# Patient Record
Sex: Female | Born: 1990 | Race: Black or African American | Hispanic: No | Marital: Single | Smoking: Never smoker
Health system: Southern US, Community
[De-identification: ages and names within clinical notes are randomized; demographics above are authoritative.]

## PROBLEM LIST (undated history)

## (undated) DIAGNOSIS — D649 Anemia, unspecified: Secondary | ICD-10-CM

## (undated) DIAGNOSIS — D696 Thrombocytopenia, unspecified: Secondary | ICD-10-CM

## (undated) DIAGNOSIS — O99119 Other diseases of the blood and blood-forming organs and certain disorders involving the immune mechanism complicating pregnancy, unspecified trimester: Secondary | ICD-10-CM

## (undated) HISTORY — DX: Other diseases of the blood and blood-forming organs and certain disorders involving the immune mechanism complicating pregnancy, unspecified trimester: O99.119

## (undated) HISTORY — DX: Thrombocytopenia, unspecified: D69.6

## (undated) HISTORY — PX: DILATION AND CURETTAGE OF UTERUS: SHX78

---
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
## 2009-01-16 ENCOUNTER — Emergency Department (HOSPITAL_COMMUNITY): Admission: EM | Admit: 2009-01-16 | Discharge: 2009-01-17 | Payer: Self-pay | Admitting: Emergency Medicine

## 2010-01-22 ENCOUNTER — Emergency Department (HOSPITAL_COMMUNITY): Admission: EM | Admit: 2010-01-22 | Discharge: 2010-01-22 | Payer: Self-pay | Admitting: Emergency Medicine

## 2010-07-20 LAB — GC/CHLAMYDIA PROBE AMP, GENITAL
Chlamydia, DNA Probe: NEGATIVE
GC Probe Amp, Genital: NEGATIVE

## 2010-07-20 LAB — URINE MICROSCOPIC-ADD ON

## 2010-07-20 LAB — URINALYSIS, ROUTINE W REFLEX MICROSCOPIC
Glucose, UA: NEGATIVE mg/dL
Ketones, ur: NEGATIVE mg/dL
pH: 7.5 (ref 5.0–8.0)

## 2015-03-14 DIAGNOSIS — Z3490 Encounter for supervision of normal pregnancy, unspecified, unspecified trimester: Secondary | ICD-10-CM | POA: Insufficient documentation

## 2017-04-13 DIAGNOSIS — K4090 Unilateral inguinal hernia, without obstruction or gangrene, not specified as recurrent: Secondary | ICD-10-CM

## 2017-04-13 HISTORY — DX: Unilateral inguinal hernia, without obstruction or gangrene, not specified as recurrent: K40.90

## 2017-05-19 DIAGNOSIS — Z0000 Encounter for general adult medical examination without abnormal findings: Secondary | ICD-10-CM | POA: Insufficient documentation

## 2017-05-19 DIAGNOSIS — D509 Iron deficiency anemia, unspecified: Secondary | ICD-10-CM | POA: Insufficient documentation

## 2017-05-19 DIAGNOSIS — L309 Dermatitis, unspecified: Secondary | ICD-10-CM | POA: Insufficient documentation

## 2017-05-19 DIAGNOSIS — Z01419 Encounter for gynecological examination (general) (routine) without abnormal findings: Secondary | ICD-10-CM | POA: Insufficient documentation

## 2017-06-16 DIAGNOSIS — K4090 Unilateral inguinal hernia, without obstruction or gangrene, not specified as recurrent: Secondary | ICD-10-CM | POA: Insufficient documentation

## 2017-09-17 DIAGNOSIS — E559 Vitamin D deficiency, unspecified: Secondary | ICD-10-CM | POA: Insufficient documentation

## 2018-08-29 HISTORY — PX: BREAST BIOPSY: SHX20

## 2019-03-07 ENCOUNTER — Ambulatory Visit (INDEPENDENT_AMBULATORY_CARE_PROVIDER_SITE_OTHER): Payer: 59 | Admitting: *Deleted

## 2019-03-07 ENCOUNTER — Other Ambulatory Visit: Payer: Self-pay

## 2019-03-07 ENCOUNTER — Encounter: Payer: Self-pay | Admitting: Family Medicine

## 2019-03-07 VITALS — Ht 61.0 in | Wt 117.2 lb

## 2019-03-07 DIAGNOSIS — Z3200 Encounter for pregnancy test, result unknown: Secondary | ICD-10-CM

## 2019-03-07 DIAGNOSIS — Z3201 Encounter for pregnancy test, result positive: Secondary | ICD-10-CM

## 2019-03-07 LAB — POCT PREGNANCY, URINE: Preg Test, Ur: POSITIVE — AB

## 2019-03-07 NOTE — Progress Notes (Addendum)
Pt moved to [place] from [place] one month ago. She was informed of +UPT.  LMP 01/29/19.  EDD 11/05/19. Pt reports she had C/S birth with her first child 03/15/15 in [place] due to her cervix was swollen. Pt desires pregnancy care in this office.

## 2019-03-21 ENCOUNTER — Telehealth (INDEPENDENT_AMBULATORY_CARE_PROVIDER_SITE_OTHER): Payer: 59 | Admitting: Lactation Services

## 2019-03-21 DIAGNOSIS — Z3480 Encounter for supervision of other normal pregnancy, unspecified trimester: Secondary | ICD-10-CM

## 2019-03-21 NOTE — Telephone Encounter (Signed)
Returned pts call in regards to spotting pink/brown discharge. She is noting is occasionally. She noted it when she wiped yesterday but sometimes noted in underwear. Reviewed this is normal in early pregnancy. Reviewed if bleeding like a period or having severe abdominal pain that pt needs to go to the MAU for assessment. Pt was informed of where MAU is located in [place].    Pt is experiencing nausea with eating and drinking. Enc small frequent meals and sipping water. Discussed Vitamin B6 is helpful for some. Enc pt to eat crackers before getting out of bed. Pt is not vomiting.   Pt to call with further questions or concerns as needed.

## 2019-03-22 ENCOUNTER — Telehealth (INDEPENDENT_AMBULATORY_CARE_PROVIDER_SITE_OTHER): Payer: 59 | Admitting: Family Medicine

## 2019-03-22 ENCOUNTER — Other Ambulatory Visit: Payer: Self-pay

## 2019-03-22 ENCOUNTER — Inpatient Hospital Stay (HOSPITAL_COMMUNITY)
Admission: AD | Admit: 2019-03-22 | Discharge: 2019-03-22 | Disposition: A | Discharge status: Home / Self Care | Payer: 59 | Attending: Obstetrics and Gynecology | Admitting: Obstetrics and Gynecology

## 2019-03-22 ENCOUNTER — Inpatient Hospital Stay (HOSPITAL_COMMUNITY): Payer: 59

## 2019-03-22 ENCOUNTER — Encounter (HOSPITAL_COMMUNITY): Payer: Self-pay | Admitting: Student

## 2019-03-22 DIAGNOSIS — O23591 Infection of other part of genital tract in pregnancy, first trimester: Secondary | ICD-10-CM | POA: Diagnosis not present

## 2019-03-22 DIAGNOSIS — Z3491 Encounter for supervision of normal pregnancy, unspecified, first trimester: Secondary | ICD-10-CM

## 2019-03-22 DIAGNOSIS — Z3A01 Less than 8 weeks gestation of pregnancy: Secondary | ICD-10-CM | POA: Insufficient documentation

## 2019-03-22 DIAGNOSIS — B9689 Other specified bacterial agents as the cause of diseases classified elsewhere: Secondary | ICD-10-CM | POA: Insufficient documentation

## 2019-03-22 DIAGNOSIS — O209 Hemorrhage in early pregnancy, unspecified: Secondary | ICD-10-CM

## 2019-03-22 DIAGNOSIS — M419 Scoliosis, unspecified: Secondary | ICD-10-CM | POA: Diagnosis not present

## 2019-03-22 DIAGNOSIS — O468X1 Other antepartum hemorrhage, first trimester: Secondary | ICD-10-CM | POA: Diagnosis not present

## 2019-03-22 DIAGNOSIS — O418X10 Other specified disorders of amniotic fluid and membranes, first trimester, not applicable or unspecified: Secondary | ICD-10-CM | POA: Insufficient documentation

## 2019-03-22 HISTORY — DX: Anemia, unspecified: D64.9

## 2019-03-22 LAB — URINALYSIS, ROUTINE W REFLEX MICROSCOPIC
Bilirubin Urine: NEGATIVE
Glucose, UA: NEGATIVE mg/dL
Hgb urine dipstick: NEGATIVE
Ketones, ur: NEGATIVE mg/dL
Leukocytes,Ua: NEGATIVE
Nitrite: NEGATIVE
Protein, ur: NEGATIVE mg/dL
Specific Gravity, Urine: 1.019 (ref 1.005–1.030)
pH: 5 (ref 5.0–8.0)

## 2019-03-22 LAB — WET PREP, GENITAL
Sperm: NONE SEEN
Trich, Wet Prep: NONE SEEN
Yeast Wet Prep HPF POC: NONE SEEN

## 2019-03-22 LAB — CBC
HCT: 39.7 % (ref 36.0–46.0)
Hemoglobin: 12.7 g/dL (ref 12.0–15.0)
MCH: 27.1 pg (ref 26.0–34.0)
MCHC: 32 g/dL (ref 30.0–36.0)
MCV: 84.8 fL (ref 80.0–100.0)
Platelets: 129 10*3/uL — ABNORMAL LOW (ref 150–400)
RBC: 4.68 MIL/uL (ref 3.87–5.11)
RDW: 19.2 % — ABNORMAL HIGH (ref 11.5–15.5)
WBC: 8.4 10*3/uL (ref 4.0–10.5)
nRBC: 0 % (ref 0.0–0.2)

## 2019-03-22 LAB — HCG, QUANTITATIVE, PREGNANCY: hCG, Beta Chain, Quant, S: 125319 m[IU]/mL — ABNORMAL HIGH (ref <5)

## 2019-03-22 LAB — ABO/RH: ABO/RH(D): O POS

## 2019-03-22 MED ORDER — METRONIDAZOLE 500 MG PO TABS: 500.0000 mg | ORAL_TABLET | Freq: Two times a day (BID) | ORAL | 0 refills | Status: DC

## 2019-03-22 NOTE — MAU Note (Signed)
Michelle Velez is a 28 y.o. at [redacted]w[redacted]d here in MAU reporting: for the past 2 days after having a BM she has seen blood. She states the bleeding is not coming from her rectum it is vaginal. States prior coming to MAU she used the bathroom to pee only and saw bleeding then. States bleeding drips into toilet and sees it on the toilet paper but not in her underwear. Also having a yellow discharge, states there is a slight odor but no itching. States she has pain at night in legs and hips.  Onset of complaint: 2 days  Pain score: 0/10  Vitals:   03/22/19 1816  BP: 113/67  Pulse: 78  Resp: 16  Temp: 98.2 F (36.8 C)  SpO2: 100%     Lab orders placed from triage: UA

## 2019-03-22 NOTE — MAU Provider Note (Signed)
Chief Complaint: Vaginal Bleeding and Vaginal Discharge   First Provider Initiated Contact with Patient 03/22/19 1836     SUBJECTIVE HPI: Michelle Velez is a 28 y.o. G3P1010 at [redacted]w[redacted]d who presents to Maternity Admissions reporting vaginal bleeding. Symptoms started a few days ago. States whenever she uses the bathroom pink tinged blood drips in the toilet. States the bleeding is definitely coming from her vagina. Reports intermittent pains in her right pelvic area over her hernia.  Denies fever/chills, dysuria, rectal bleeding. Has not had any evaluation with this pregnancy so far.   Location: abdomen/pelvis Quality: sharp Severity: 0 currently/10 on pain scale Duration: 3 days Timing: intermittently  Modifying factors: none Associated signs and symptoms: vaginal bleeding  Past Medical History:  Diagnosis Date  . Anemia   . Inguinal hernia 2019  . Scoliosis 2006   OB History  Gravida Para Term Preterm AB Living  3 1 1   1     SAB TAB Ectopic Multiple Live Births  1            # Outcome Date GA Lbr Len/2nd Weight Sex Delivery Anes PTL Lv  3 Current           2 Term 03/15/15     CS-LTranv     1 SAB 2013           Past Surgical History:  Procedure Laterality Date  . CESAREAN SECTION     Social History   Socioeconomic History  . Marital status: Single    Spouse name: Not on file  . Number of children: Not on file  . Years of education: Not on file  . Highest education level: Not on file  Occupational History  . Not on file  Social Needs  . Financial resource strain: Not on file  . Food insecurity    Worry: Not on file    Inability: Not on file  . Transportation needs    Medical: Not on file    Non-medical: Not on file  Tobacco Use  . Smoking status: Never Smoker  . Smokeless tobacco: Never Used  Substance and Sexual Activity  . Alcohol use: Never    Frequency: Never  . Drug use: Never  . Sexual activity: Yes  Lifestyle  . Physical activity    Days per week: Not  on file    Minutes per session: Not on file  . Stress: Not on file  Relationships  . Social 2014 on phone: Not on file    Gets together: Not on file    Attends religious service: Not on file    Active member of club or organization: Not on file    Attends meetings of clubs or organizations: Not on file    Relationship status: Not on file  . Intimate partner violence    Fear of current or ex partner: Not on file    Emotionally abused: Not on file    Physically abused: Not on file    Forced sexual activity: Not on file  Other Topics Concern  . Not on file  Social History Narrative  . Not on file   History reviewed. No pertinent family history. No current facility-administered medications on file prior to encounter.    Current Outpatient Medications on File Prior to Encounter  Medication Sig Dispense Refill  . ferrous sulfate 325 (65 FE) MG tablet Take 325 mg by mouth every other day.    . Prenatal Vit-Fe Fumarate-FA (MULTIVITAMIN-PRENATAL) 27-0.8 MG  TABS tablet Take 1 tablet by mouth daily at 12 noon.     No Known Allergies  I have reviewed patient's Past Medical Hx, Surgical Hx, Family Hx, Social Hx, medications and allergies.   Review of Systems  Constitutional: Negative.   Gastrointestinal: Positive for abdominal pain (none currently) and constipation. Negative for anal bleeding, blood in stool, diarrhea, nausea, rectal pain and vomiting.  Genitourinary: Positive for vaginal bleeding and vaginal discharge. Negative for dysuria.    OBJECTIVE Patient Vitals for the past 24 hrs:  BP Temp Temp src Pulse Resp SpO2 Height Weight  03/22/19 1816 113/67 98.2 F (36.8 C) Oral 78 16 100 % - -  03/22/19 1813 - - - - - - 5\' 1"  (1.549 m) 52.9 kg   Constitutional: Well-developed, well-nourished female in no acute distress.  Cardiovascular: normal rate & rhythm, no murmur Respiratory: normal rate and effort. Lung sounds clear throughout GI: Abd soft, non-tender, Pos  BS x 4. No guarding or rebound tenderness MS: Extremities nontender, no edema, normal ROM Neurologic: Alert and oriented x 4.  GU:     SPECULUM EXAM: NEFG, no blood. Cervix pink/smooth. Moderate amount of foul smelling tan discharge.   BIMANUAL: No CMT. cervix closed; uterus normal size, no adnexal tenderness or masses.    LAB RESULTS Results for orders placed or performed during the hospital encounter of 03/22/19 (from the past 24 hour(s))  Urinalysis, Routine w reflex microscopic     Status: None   Collection Time: 03/22/19  6:24 PM  Result Value Ref Range   Color, Urine YELLOW YELLOW   APPearance CLEAR CLEAR   Specific Gravity, Urine 1.019 1.005 - 1.030   pH 5.0 5.0 - 8.0   Glucose, UA NEGATIVE NEGATIVE mg/dL   Hgb urine dipstick NEGATIVE NEGATIVE   Bilirubin Urine NEGATIVE NEGATIVE   Ketones, ur NEGATIVE NEGATIVE mg/dL   Protein, ur NEGATIVE NEGATIVE mg/dL   Nitrite NEGATIVE NEGATIVE   Leukocytes,Ua NEGATIVE NEGATIVE  CBC     Status: Abnormal   Collection Time: 03/22/19  6:47 PM  Result Value Ref Range   WBC 8.4 4.0 - 10.5 K/uL   RBC 4.68 3.87 - 5.11 MIL/uL   Hemoglobin 12.7 12.0 - 15.0 g/dL   HCT 40.939.7 81.136.0 - 91.446.0 %   MCV 84.8 80.0 - 100.0 fL   MCH 27.1 26.0 - 34.0 pg   MCHC 32.0 30.0 - 36.0 g/dL   RDW 78.219.2 (H) 95.611.5 - 21.315.5 %   Platelets 129 (L) 150 - 400 K/uL   nRBC 0.0 0.0 - 0.2 %  ABO/Rh     Status: None   Collection Time: 03/22/19  6:47 PM  Result Value Ref Range   ABO/RH(D) O POS    No rh immune globuloin      NOT A RH IMMUNE GLOBULIN CANDIDATE, PT RH POSITIVE Performed at [facility], [address] [place], [place] [address]   Wet prep, genital     Status: Abnormal   Collection Time: 03/22/19  7:13 PM  Result Value Ref Range   Yeast Wet Prep HPF POC NONE SEEN NONE SEEN   Trich, Wet Prep NONE SEEN NONE SEEN   Clue Cells Wet Prep HPF POC PRESENT (A) NONE SEEN   WBC, Wet Prep HPF POC MANY (A) NONE SEEN   Sperm NONE SEEN     IMAGING No results  found.  MAU COURSE Orders Placed This Encounter  Procedures  . Wet prep, genital  . US OB Comp  Less 14 Wks  . Urinalysis, Routine w reflex microscopic  . CBC  . hCG, quantitative, pregnancy  . ABO/Rh  . Discharge patient   Meds ordered this encounter  Medications  . metroNIDAZOLE (FLAGYL) 500 MG tablet    Sig: Take 1 tablet (500 mg total) by mouth 2 (two) times daily.    Dispense:  14 tablet    Refill:  0    Order Specific Question:   Supervising Provider    Answer:   ERVIN, MICHAEL L [[id number]]    MDM +UPT UA, wet prep, GC/chlamydia, CBC, ABO/Rh, quant hCG, and [place] today to rule out ectopic pregnancy which can be life threatening.   RH positive  No blood on exam. Discharge consistent with bacterial vaginosis & wet prep positive for clue cells  Ultrasound shows live IUP & a small [place]   A:  1. Normal IUP (intrauterine pregnancy) on prenatal ultrasound, first trimester   2. Vaginal bleeding in pregnancy, first trimester   3. Subchorionic hematoma in first trimester, single or unspecified fetus   4. Bacterial vaginosis    P: Discharge home Rx flagyl GC/CT pending F/u with ob/gyn Reviewed reasons to return to MAU   Jorje Guild, NP 03/22/2019  8:01 PM

## 2019-03-22 NOTE — Discharge Instructions (Signed)
Subchorionic Hematoma ° °A subchorionic hematoma is a gathering of blood between the outer wall of the embryo (chorion) and the inner wall of the womb (uterus). °This condition can cause vaginal bleeding. If they cause little or no vaginal bleeding, early small hematomas usually shrink on their own and do not affect your baby or pregnancy. When bleeding starts later in pregnancy, or if the hematoma is larger or occurs in older pregnant women, the condition may be more serious. Larger hematomas may get bigger, which increases the chances of miscarriage. This condition also increases the risk of: °· Premature separation of the placenta from the uterus. °· Premature (preterm) labor. °· Stillbirth. °What are the causes? °The exact cause of this condition is not known. It occurs when blood is trapped between the placenta and the uterine wall because the placenta has separated from the original site of implantation. °What increases the risk? °You are more likely to develop this condition if: °· You were treated with fertility medicines. °· You conceived through in vitro fertilization (IVF). °What are the signs or symptoms? °Symptoms of this condition include: °· Vaginal spotting or bleeding. °· Contractions of the uterus. These cause abdominal pain. °Sometimes you may have no symptoms and the bleeding may only be seen when ultrasound images are taken (transvaginal ultrasound). °How is this diagnosed? °This condition is diagnosed based on a physical exam. This includes a pelvic exam. You may also have other tests, including: °· Blood tests. °· Urine tests. °· Ultrasound of the abdomen. °How is this treated? °Treatment for this condition can vary. Treatment may include: °· Watchful waiting. You will be monitored closely for any changes in bleeding. During this stage: °? The hematoma may be reabsorbed by the body. °? The hematoma may separate the fluid-filled space containing the embryo (gestational sac) from the wall of the  womb (endometrium). °· Medicines. °· Activity restriction. This may be needed until the bleeding stops. °Follow these instructions at home: °· Stay on bed rest if told to do so by your health care provider. °· Do not lift anything that is heavier than 10 lbs. (4.5 kg) or as told by your health care provider. °· Do not use any products that contain nicotine or tobacco, such as cigarettes and e-cigarettes. If you need help quitting, ask your health care provider. °· Track and write down the number of pads you use each day and how soaked (saturated) they are. °· Do not use tampons. °· Keep all follow-up visits as told by your health care provider. This is important. Your health care provider may ask you to have follow-up blood tests or ultrasound tests or both. °Contact a health care provider if: °· You have any vaginal bleeding. °· You have a fever. °Get help right away if: °· You have severe cramps in your stomach, back, abdomen, or pelvis. °· You pass large clots or tissue. Save any tissue for your health care provider to look at. °· You have more vaginal bleeding, and you faint or become lightheaded or weak. °Summary °· A subchorionic hematoma is a gathering of blood between the outer wall of the placenta and the uterus. °· This condition can cause vaginal bleeding. °· Sometimes you may have no symptoms and the bleeding may only be seen when ultrasound images are taken. °· Treatment may include watchful waiting, medicines, or activity restriction. °This information is not intended to replace advice given to you by your health care provider. Make sure you discuss any questions you   have with your health care provider. Document Released: 07/15/2006 Document Revised: 03/12/2017 Document Reviewed: 05/26/2016 Elsevier Patient Education  2020 [employer].     Constipation, Adult Constipation is when a person has fewer bowel movements in a week than normal, has difficulty having a bowel movement, or has stools  that are dry, hard, or larger than normal. Constipation may be caused by an underlying condition. It may become worse with age if a person takes certain medicines and does not take in enough fluids. Follow these instructions at home: Eating and drinking   Eat foods that have a lot of fiber, such as fresh fruits and vegetables, whole grains, and beans.  Limit foods that are high in fat, low in fiber, or overly processed, such as french fries, hamburgers, cookies, candies, and soda.  Drink enough fluid to keep your urine clear or pale yellow. General instructions  Exercise regularly or as told by your health care provider.  Go to the restroom when you have the urge to go. Do not hold it in.  Take over-the-counter and prescription medicines only as told by your health care provider. These include any fiber supplements.  Practice pelvic floor retraining exercises, such as deep breathing while relaxing the lower abdomen and pelvic floor relaxation during bowel movements.  Watch your condition for any changes.  Keep all follow-up visits as told by your health care provider. This is important. Contact a health care provider if:  You have pain that gets worse.  You have a fever.  You do not have a bowel movement after 4 days.  You vomit.  You are not hungry.  You lose weight.  You are bleeding from the anus.  You have thin, pencil-like stools. Get help right away if:  You have a fever and your symptoms suddenly get worse.  You leak stool or have blood in your stool.  Your abdomen is bloated.  You have severe pain in your abdomen.  You feel dizzy or you faint. This information is not intended to replace advice given to you by your health care provider. Make sure you discuss any questions you have with your health care provider. Document Released: 12/27/2003 Document Revised: 03/12/2017 Document Reviewed: 09/18/2015 Elsevier Patient Education  [employer].       Bacterial Vaginosis  Bacterial vaginosis is an infection of the vagina. It happens when too many normal germs (healthy bacteria) grow in the vagina. This infection puts you at risk for infections from sex (STIs). Treating this infection can lower your risk for some STIs. You should also treat this if you are pregnant. It can cause your baby to be born early. Follow these instructions at home: Medicines  Take over-the-counter and prescription medicines only as told by your doctor.  Take or use your antibiotic medicine as told by your doctor. Do not stop taking or using it even if you start to feel better. General instructions  If you your sexual partner is a woman, tell her that you have this infection. She needs to get treatment if she has symptoms. If you have a female partner, he does not need to be treated.  During treatment: ? Avoid sex. ? Do not douche. ? Avoid alcohol as told. ? Avoid breastfeeding as told.  Drink enough fluid to keep your pee (urine) clear or pale yellow.  Keep your vagina and butt (rectum) clean. ? Wash the area with warm water every day. ? Wipe from front to back after you  use the toilet.  Keep all follow-up visits as told by your doctor. This is important. Preventing this condition  Do not douche.  Use only warm water to wash around your vagina.  Use protection when you have sex. This includes: ? Latex condoms. ? Dental dams.  Limit how many people you have sex with. It is best to only have sex with the same person (be monogamous).  Get tested for STIs. Have your partner get tested.  Wear underwear that is cotton or lined with cotton.  Avoid tight pants and pantyhose. This is most important in summer.  Do not use any products that have nicotine or tobacco in them. These include cigarettes and e-cigarettes. If you need help quitting, ask your doctor.  Do not use illegal drugs.  Limit how much alcohol you drink. Contact a  doctor if:  Your symptoms do not get better, even after you are treated.  You have more discharge or pain when you pee (urinate).  You have a fever.  You have pain in your belly (abdomen).  You have pain with sex.  Your bleed from your vagina between periods. Summary  This infection happens when too many germs (bacteria) grow in the vagina.  Treating this condition can lower your risk for some infections from sex (STIs).  You should also treat this if you are pregnant. It can cause early (premature) birth.  Do not stop taking or using your antibiotic medicine even if you start to feel better. This information is not intended to replace advice given to you by your health care provider. Make sure you discuss any questions you have with your health care provider. Document Released: 01/07/2008 Document Revised: 03/12/2017 Document Reviewed: 12/14/2015 Elsevier Patient Education  2020 [employer].

## 2019-03-22 NOTE — Telephone Encounter (Signed)
Patient called to say she was having some virginal bleeding with bowel movements. Her first appointment is with [place] on 12/17 via telephone for intake visit.

## 2019-03-22 NOTE — Telephone Encounter (Signed)
Pt reports she is having vaginal bleeding when having bowel movements. She reports it is dropping in the toilet and is about the size of a quarter x 1 this morning and reports she is sure it is coming from her vagina. Discussed that this can be normal early on due to increasing vascularity of the cervix. Reviewed when to seek further care.   She is constipated and straining to stool. She reports she is not drinking lots of water. She has to drink slowly due to nausea. She is sipping water off and on all day. She is eating fruits daily. She does have a hemorrhoid and reports it is not bleeding. Reviewed increasing fiber and getting some stool softener to take 2 x a day to help with constipation.   Pt reports some cramping yesterday evening. She reports she has a hernia under her pelvic bone. Reviewed some mild cramping is normal in early pregnancy.   She reports she is having vaginal discharge that is white or yellow with an odor. She has had BV previously. She is not sure if this is BV like she had before. Offered her an appt. from Monday at 2:30 for self swab ans she agreed.   Reviewed with pt that is she is having increased bleeding or pain she can go to the Maternity Assessment Unit at [facility]. She was given address. Discussed ideally she only needs to go in case of emergency as indicated above as Covid cases are rising. Pt voiced understanding.

## 2019-03-23 ENCOUNTER — Encounter: Payer: Self-pay | Admitting: *Deleted

## 2019-03-24 ENCOUNTER — Telehealth: Payer: Self-pay | Admitting: Obstetrics & Gynecology

## 2019-03-24 LAB — GC/CHLAMYDIA PROBE AMP (~~LOC~~) NOT AT ARMC
Chlamydia: NEGATIVE
Comment: NEGATIVE
Comment: NORMAL
Neisseria Gonorrhea: NEGATIVE

## 2019-03-24 NOTE — Telephone Encounter (Signed)
Pt called nurse line c/o inability to swallow flagyl.  Pt was diagnosed with BV seeral days ago in MAU as well as subchorionic hemorrhage.  Pt was not c/o vaginal discharge.  Metrogel has riskier profile than flagyl.  If pt not having vaginal symptoms, I suggest she stop medications and we can retest at her first OB visit.  If she becomes more symptomatic, she can call back and we can discuss starting metrogel.

## 2019-03-27 ENCOUNTER — Ambulatory Visit: Payer: 59

## 2019-03-27 ENCOUNTER — Other Ambulatory Visit: Payer: Self-pay

## 2019-03-27 DIAGNOSIS — N898 Other specified noninflammatory disorders of vagina: Secondary | ICD-10-CM

## 2019-03-27 NOTE — Progress Notes (Signed)
Pt here today for self swab.  Per chart review, pt was advised by Dr. Gala Romney "If pt not having vaginal symptoms, I suggest she stop medications and we can retest at her first OB visit.  If she becomes more symptomatic, she can call back and we can discuss starting metrogel."  Pt reports that she has 8 more tablets left and that she had a lot of discharge that dropped in the toilet yesterday.    I advised pt to complete her tx and that we can reevaluate at her NEW OB visit on 04/12/19.  Pt verbalized understanding.   Mel Almond, RN 03/27/19

## 2019-03-30 ENCOUNTER — Encounter: Payer: Self-pay | Admitting: *Deleted

## 2019-04-13 ENCOUNTER — Ambulatory Visit (INDEPENDENT_AMBULATORY_CARE_PROVIDER_SITE_OTHER): Payer: 59 | Admitting: Obstetrics and Gynecology

## 2019-04-13 ENCOUNTER — Encounter: Payer: Self-pay | Admitting: Obstetrics and Gynecology

## 2019-04-13 ENCOUNTER — Other Ambulatory Visit: Payer: Self-pay

## 2019-04-13 VITALS — BP 113/72 | HR 79 | Wt 113.8 lb

## 2019-04-13 DIAGNOSIS — Z113 Encounter for screening for infections with a predominantly sexual mode of transmission: Secondary | ICD-10-CM

## 2019-04-13 DIAGNOSIS — Z3480 Encounter for supervision of other normal pregnancy, unspecified trimester: Secondary | ICD-10-CM | POA: Insufficient documentation

## 2019-04-13 DIAGNOSIS — N898 Other specified noninflammatory disorders of vagina: Secondary | ICD-10-CM

## 2019-04-13 DIAGNOSIS — O26891 Other specified pregnancy related conditions, first trimester: Secondary | ICD-10-CM

## 2019-04-13 DIAGNOSIS — Z3A10 10 weeks gestation of pregnancy: Secondary | ICD-10-CM

## 2019-04-13 DIAGNOSIS — Z98891 History of uterine scar from previous surgery: Secondary | ICD-10-CM

## 2019-04-13 DIAGNOSIS — O0990 Supervision of high risk pregnancy, unspecified, unspecified trimester: Secondary | ICD-10-CM | POA: Insufficient documentation

## 2019-04-13 HISTORY — DX: History of uterine scar from previous surgery: Z98.891

## 2019-04-13 NOTE — Progress Notes (Signed)
`  Pt states Tues & Wednesday she noticed after bowel movent that she had [place] d/c, she states also has a bump near Vulva area.

## 2019-04-13 NOTE — Progress Notes (Signed)
History:   Michelle Velez is a 28 y.o. G3P1011 at [redacted]w[redacted]d by LMP being seen today for her first obstetrical visit.  Her obstetrical history is significant for Cesarean section. Patient does intend to breast feed. Pregnancy history fully reviewed. States she was told she had a subchorionic hemorrhage with this pregnancy.  Patient reports nausea. Declines the need for medication at this time.     HISTORY: OB History  Gravida Para Term Preterm AB Living  3 1 1  0 1 1  SAB TAB Ectopic Multiple Live Births  1 0 0 0 1    # Outcome Date GA Lbr Len/2nd Weight Sex Delivery Anes PTL Lv  3 Current           2 Term 03/15/15     CS-LTranv     1 SAB 2013            Last pap smear was done 05/19/2017 and was normal  Past Medical History:  Diagnosis Date  . Anemia   . Inguinal hernia 2019  . Scoliosis 2006   Past Surgical History:  Procedure Laterality Date  . CESAREAN SECTION     History reviewed. No pertinent family history. Social History   Tobacco Use  . Smoking status: Never Smoker  . Smokeless tobacco: Never Used  Substance Use Topics  . Alcohol use: Never  . Drug use: Never   No Known Allergies Current Outpatient Medications on File Prior to Visit  Medication Sig Dispense Refill  . Prenatal Vit-Fe Fumarate-FA (MULTIVITAMIN-PRENATAL) 27-0.8 MG TABS tablet Take 1 tablet by mouth daily at 12 noon.    . ferrous sulfate 325 (65 FE) MG tablet Take 325 mg by mouth every other day.     No current facility-administered medications on file prior to visit.    Review of Systems Pertinent items noted in HPI and remainder of comprehensive ROS otherwise negative. Physical Exam:   Vitals:   04/13/19 0900  BP: 113/72  Pulse: 79  Weight: 113 lb 12.8 oz (51.6 kg)   Fetal Heart Rate (bpm): 169 Uterus:     Pelvic Exam: Perineum: no hemorrhoids, normal perineum. Tenderness near bartholin Gland. No abscess or fluctuance.    Vulva: normal external genitalia, no lesions   Vagina:  normal  mucosa, moderate amount of brown vaginal discharge.    Cervix: no lesions and normal, pap smear done.    Adnexa: normal adnexa and no mass, fullness, tenderness   Bony Pelvis: average  System: General: well-developed, well-nourished female in no acute distress   Skin: normal coloration and turgor, no rashes   Neurologic: oriented, normal, negative, normal mood   Extremities: normal strength, tone, and muscle mass, ROM of all joints is normal   HEENT PERRLA, extraocular movement intact and sclera clear, anicteric   Mouth/Teeth mucous membranes moist, pharynx normal without lesions and dental hygiene good   Neck supple and no masses   Cardiovascular: regular rate and rhythm   Respiratory:  no respiratory distress, normal breath sounds   Abdomen: soft, non-tender; bowel sounds normal; no masses,  no organomegaly  Bedside Ultrasound for FHR check: Patient informed that the ultrasound is considered a limited obstetric ultrasound and is not intended to be a complete ultrasound exam.  Patient also informed that the ultrasound is not being completed with the intent of assessing for fetal or placental anomalies or any pelvic abnormalities.  Explained that the purpose of today's ultrasound is to assess for fetal heart rate.  Patient acknowledges the purpose  of the exam and the limitations of the study.     Assessment:    Pregnancy: G3P1011 Patient Active Problem List   Diagnosis Date Noted  . Supervision of other normal pregnancy, antepartum 04/13/2019  . History of cesarean delivery 04/13/2019     Plan:   1. Supervision of other normal pregnancy, antepartum  - [facility] AMB BABYSCRIPTS SCHEDULE OPTIMIZATION - Culture, OB Urine - Genetic Screening - Obstetric Panel, Including HIV - [place] MFM OB COMP + 14 WK; Future - Cervicovaginal ancillary only( [place]) - Bedside US done: active fetus   2. History of cesarean delivery  Should see MD to discuss TOLAC   3. Vaginal discharge  -  Cervicovaginal ancillary only( [place])   Initial labs drawn. Continue prenatal vitamins. Genetic Screening discussed, NIPS: requested. Ultrasound discussed; fetal anatomic survey: requested. Problem list reviewed and updated. The nature of [facility] with multiple MDs and other Advanced Practice Providers was explained to patient; also emphasized that residents, students are part of our team. Routine obstetric precautions reviewed. No follow-ups on file.     Amery Vandenbos, Harolyn Rutherford, NP  [facility] [employer], [facility]

## 2019-04-14 LAB — OBSTETRIC PANEL, INCLUDING HIV
Antibody Screen: NEGATIVE
Basophils Absolute: 0 10*3/uL (ref 0.0–0.2)
Basos: 0 %
EOS (ABSOLUTE): 0.1 10*3/uL (ref 0.0–0.4)
Eos: 1 %
HIV Screen 4th Generation wRfx: NONREACTIVE
Hematocrit: 39.5 % (ref 34.0–46.6)
Hemoglobin: 13.1 g/dL (ref 11.1–15.9)
Hepatitis B Surface Ag: NEGATIVE
Immature Grans (Abs): 0 10*3/uL (ref 0.0–0.1)
Immature Granulocytes: 0 %
Lymphocytes Absolute: 1 10*3/uL (ref 0.7–3.1)
Lymphs: 15 %
MCH: 28.1 pg (ref 26.6–33.0)
MCHC: 33.2 g/dL (ref 31.5–35.7)
MCV: 85 fL (ref 79–97)
Monocytes Absolute: 0.6 10*3/uL (ref 0.1–0.9)
Monocytes: 9 %
Neutrophils Absolute: 5 10*3/uL (ref 1.4–7.0)
Neutrophils: 75 %
Platelets: 115 10*3/uL — ABNORMAL LOW (ref 150–450)
RBC: 4.66 x10E6/uL (ref 3.77–5.28)
RDW: 17.5 % — ABNORMAL HIGH (ref 11.7–15.4)
RPR Ser Ql: NONREACTIVE
Rh Factor: POSITIVE
Rubella Antibodies, IGG: 3.13 index (ref >0.99)
WBC: 6.8 10*3/uL (ref 3.4–10.8)

## 2019-04-15 LAB — CULTURE, OB URINE

## 2019-04-15 LAB — URINE CULTURE, OB REFLEX

## 2019-04-17 LAB — CERVICOVAGINAL ANCILLARY ONLY
Bacterial Vaginitis (gardnerella): NEGATIVE
Candida Glabrata: NEGATIVE
Candida Vaginitis: NEGATIVE
Chlamydia: NEGATIVE
Comment: NEGATIVE
Comment: NEGATIVE
Comment: NEGATIVE
Comment: NEGATIVE
Comment: NEGATIVE
Comment: NORMAL
Neisseria Gonorrhea: NEGATIVE
Trichomonas: NEGATIVE

## 2019-04-26 ENCOUNTER — Encounter: Payer: Self-pay | Admitting: *Deleted

## 2019-05-03 ENCOUNTER — Encounter: Payer: Self-pay | Admitting: *Deleted

## 2019-05-03 ENCOUNTER — Telehealth (INDEPENDENT_AMBULATORY_CARE_PROVIDER_SITE_OTHER): Payer: 59 | Admitting: Lactation Services

## 2019-05-03 DIAGNOSIS — Z3480 Encounter for supervision of other normal pregnancy, unspecified trimester: Secondary | ICD-10-CM

## 2019-05-03 NOTE — Telephone Encounter (Signed)
Received an alert from [employer] that BP was 111/93 with repeat of 125/74. Pt reports she had a headache yesterday that resolved with sleeping. She reports when her BP was elevated she was getting up to get her phone while taking her BP. She denies blurred vision or dizziness. Pt with no questions or concerns at this time.

## 2019-05-11 ENCOUNTER — Telehealth (INDEPENDENT_AMBULATORY_CARE_PROVIDER_SITE_OTHER): Payer: 59 | Admitting: Obstetrics and Gynecology

## 2019-05-11 VITALS — BP 118/76 | Wt 115.6 lb

## 2019-05-11 DIAGNOSIS — Z3A14 14 weeks gestation of pregnancy: Secondary | ICD-10-CM

## 2019-05-11 DIAGNOSIS — Z3480 Encounter for supervision of other normal pregnancy, unspecified trimester: Secondary | ICD-10-CM

## 2019-05-11 DIAGNOSIS — Z3482 Encounter for supervision of other normal pregnancy, second trimester: Secondary | ICD-10-CM

## 2019-05-11 NOTE — Progress Notes (Signed)
I connected with  [name] on 05/11/19 at 10:15 AM EST by telephone and verified that I am speaking with the correct person using two identifiers.   I discussed the limitations, risks, security and privacy concerns of performing an evaluation and management service by telephone and the availability of in person appointments. I also discussed with the patient that there may be a patient responsible charge related to this service. The patient expressed understanding and agreed to proceed.  Janene Madeira Larissa Pegg, CMA 05/11/2019  10:00 AM   Right side of  Abdomen is hard.

## 2019-05-11 NOTE — Progress Notes (Signed)
   TELEHEALTH OBSTETRICS PRENATAL VIRTUAL VIDEO VISIT ENCOUNTER NOTE  Provider location: [facility] [employer] at [place]   I connected with Michelle Velez on 05/11/19 at 10:15 AM EST by WebEx Video Encounter at home and verified that I am speaking with the correct person using two identifiers.   I discussed the limitations, risks, security and privacy concerns of performing an evaluation and management service virtually and the availability of in person appointments. I also discussed with the patient that there may be a patient responsible charge related to this service. The patient expressed understanding and agreed to proceed. Subjective:  Michelle Velez is a 29 y.o. G3P1011 at [redacted]w[redacted]d being seen today for ongoing prenatal care.  She is currently monitored for the following issues for this low-risk pregnancy and has Supervision of other normal pregnancy, antepartum and History of cesarean delivery on their problem list.  Patient reports Hard spot near C-section scar..  Contractions: Not present. Vag. Bleeding: Other  Movement: Absent. Denies any leaking of fluid.  Subchorionic hemorrhage. Bleeding has stopped   The following portions of the patient's history were reviewed and updated as appropriate: allergies, current medications, past family history, past medical history, past social history, past surgical history and problem list.   Objective:   Vitals:   05/11/19 1000  BP: 118/76  Weight: 115 lb 9.6 oz (52.4 kg)    Fetal Status:     Movement: Absent     General:  Alert, oriented and cooperative. Patient is in no acute distress.  Respiratory: Normal respiratory effort, no problems with respiration noted  Mental Status: Normal mood and affect. Normal behavior. Normal judgment and thought content.  Rest of physical exam deferred due to type of encounter  Imaging: No results found.  Assessment and Plan:  Pregnancy: G3P1011 at [redacted]w[redacted]d  1. Supervision of other normal pregnancy,  antepartum  - AFP, Serum, Open Spina Bifida; Future - Bp good today - in-person visit for AFP, [place] in 4 weeks. Will evaluate c-section scar.   Preterm labor symptoms and general obstetric precautions including but not limited to vaginal bleeding, contractions, leaking of fluid and fetal movement were reviewed in detail with the patient. I discussed the assessment and treatment plan with the patient. The patient was provided an opportunity to ask questions and all were answered. The patient agreed with the plan and demonstrated an understanding of the instructions. The patient was advised to call back or seek an in-person office evaluation/go to [facility] at [facility] for any urgent or concerning symptoms. Please refer to After Visit Summary for other counseling recommendations.   I provided 10 minutes of face-to-face time during this encounter.  Return in about 4 weeks (around 06/08/2019) for Please schedule [place] in 3-4 weeks. Please schedule in person visit in 3-4 weeks on the same day as [place].Marland Kitchen  No future appointments.  Venia Carbon, NP [facility] [employer], [facility]

## 2019-05-18 ENCOUNTER — Inpatient Hospital Stay (HOSPITAL_COMMUNITY)
Admission: AD | Admit: 2019-05-18 | Discharge: 2019-05-18 | Disposition: A | Discharge status: Home / Self Care | Payer: 59 | Attending: Obstetrics and Gynecology | Admitting: Obstetrics and Gynecology

## 2019-05-18 ENCOUNTER — Other Ambulatory Visit: Payer: Self-pay

## 2019-05-18 ENCOUNTER — Encounter (HOSPITAL_COMMUNITY): Payer: Self-pay | Admitting: Obstetrics and Gynecology

## 2019-05-18 DIAGNOSIS — R1030 Lower abdominal pain, unspecified: Secondary | ICD-10-CM | POA: Insufficient documentation

## 2019-05-18 DIAGNOSIS — Z3480 Encounter for supervision of other normal pregnancy, unspecified trimester: Secondary | ICD-10-CM

## 2019-05-18 DIAGNOSIS — R300 Dysuria: Secondary | ICD-10-CM | POA: Diagnosis not present

## 2019-05-18 DIAGNOSIS — Z98891 History of uterine scar from previous surgery: Secondary | ICD-10-CM

## 2019-05-18 DIAGNOSIS — Z3A15 15 weeks gestation of pregnancy: Secondary | ICD-10-CM

## 2019-05-18 DIAGNOSIS — O26892 Other specified pregnancy related conditions, second trimester: Secondary | ICD-10-CM

## 2019-05-18 DIAGNOSIS — R102 Pelvic and perineal pain: Secondary | ICD-10-CM

## 2019-05-18 DIAGNOSIS — O26899 Other specified pregnancy related conditions, unspecified trimester: Secondary | ICD-10-CM

## 2019-05-18 LAB — URINALYSIS, ROUTINE W REFLEX MICROSCOPIC
Bilirubin Urine: NEGATIVE
Glucose, UA: NEGATIVE mg/dL
Hgb urine dipstick: NEGATIVE
Ketones, ur: NEGATIVE mg/dL
Leukocytes,Ua: NEGATIVE
Nitrite: NEGATIVE
Protein, ur: NEGATIVE mg/dL
Specific Gravity, Urine: 1.016 (ref 1.005–1.030)
pH: 6 (ref 5.0–8.0)

## 2019-05-18 MED ORDER — PHENAZOPYRIDINE HCL 200 MG PO TABS: 200.0000 mg | ORAL_TABLET | Freq: Three times a day (TID) | ORAL | 0 refills | Status: DC

## 2019-05-18 NOTE — Discharge Instructions (Signed)
Dysuria Dysuria is pain or discomfort while urinating. The pain or discomfort may be felt in the part of your body that drains urine from the bladder (urethra) or in the surrounding tissue of the genitals. The pain may also be felt in the groin area, lower abdomen, or lower back. You may have to urinate frequently or have the sudden feeling that you have to urinate (urgency). Dysuria can affect both men and women, but it is more common in women. Dysuria can be caused by many different things, including:  Urinary tract infection.  Kidney stones or bladder stones.  Certain sexually transmitted infections (STIs), such as chlamydia.  Dehydration.  Inflammation of the tissues of the vagina.  Use of certain medicines.  Use of certain soaps or scented products that cause irritation. Follow these instructions at home: General instructions  Watch your condition for any changes.  Urinate often. Avoid holding urine for long periods of time.  After a bowel movement or urination, women should cleanse from front to back, using each tissue only once.  Urinate after sexual intercourse.  Keep all follow-up visits as told by your health care provider. This is important.  If you had any tests done to find the cause of dysuria, it is up to you to get your test results. Ask your health care provider, or the department that is doing the test, when your results will be ready. Eating and drinking   Drink enough fluid to keep your urine pale yellow.  Avoid caffeine, tea, and alcohol. They can irritate the bladder and make dysuria worse. In men, alcohol may irritate the prostate. Medicines  Take over-the-counter and prescription medicines only as told by your health care provider.  If you were prescribed an antibiotic medicine, take it as told by your health care provider. Do not stop taking the antibiotic even if you start to feel better. Contact a health care provider if:  You have a  fever.  You develop pain in your back or sides.  You have nausea or vomiting.  You have blood in your urine.  You are not urinating as often as you usually do. Get help right away if:  Your pain is severe and not relieved with medicines.  You cannot eat or drink without vomiting.  You are confused.  You have a rapid heartbeat while at rest.  You have shaking or chills.  You feel extremely weak. Summary  Dysuria is pain or discomfort while urinating. Many different conditions can lead to dysuria.  If you have dysuria, you may have to urinate frequently or have the sudden feeling that you have to urinate (urgency).  Watch your condition for any changes. Keep all follow-up visits as told by your health care provider.  Make sure that you urinate often and drink enough fluid to keep your urine pale yellow. This information is not intended to replace advice given to you by your health care provider. Make sure you discuss any questions you have with your health care provider. Document Revised: 03/12/2017 Document Reviewed: 01/14/2017 Elsevier Patient Education  2020 Elsevier Inc.   Round Ligament Pain  The round ligament is a cord of muscle and tissue that helps support the uterus. It can become a source of pain during pregnancy if it becomes stretched or twisted as the baby grows. The pain usually begins in the second trimester (13-28 weeks) of pregnancy, and it can come and go until the baby is delivered. It is not a serious problem, and  it does not cause harm to the baby. Round ligament pain is usually a short, sharp, and pinching pain, but it can also be a dull, lingering, and aching pain. The pain is felt in the lower side of the abdomen or in the groin. It usually starts deep in the groin and moves up to the outside of the hip area. The pain may occur when you:  Suddenly change position, such as quickly going from a sitting to standing position.  Roll over in bed.  Cough  or sneeze.  Do physical activity. Follow these instructions at home:   Watch your condition for any changes.  When the pain starts, relax. Then try any of these methods to help with the pain: ? Sitting down. ? Flexing your knees up to your abdomen. ? Lying on your side with one pillow under your abdomen and another pillow between your legs. ? Sitting in a warm bath for 15-20 minutes or until the pain goes away.  Take over-the-counter and prescription medicines only as told by your health care provider.  Move slowly when you sit down or stand up.  Avoid long walks if they cause pain.  Stop or reduce your physical activities if they cause pain.  Keep all follow-up visits as told by your health care provider. This is important. Contact a health care provider if:  Your pain does not go away with treatment.  You feel pain in your back that you did not have before.  Your medicine is not helping. Get help right away if:  You have a fever or chills.  You develop uterine contractions.  You have vaginal bleeding.  You have nausea or vomiting.  You have diarrhea.  You have pain when you urinate. Summary  Round ligament pain is felt in the lower abdomen or groin. It is usually a short, sharp, and pinching pain. It can also be a dull, lingering, and aching pain.  This pain usually begins in the second trimester (13-28 weeks). It occurs because the uterus is stretching with the growing baby, and it is not harmful to the baby.  You may notice the pain when you suddenly change position, when you cough or sneeze, or during physical activity.  Relaxing, flexing your knees to your abdomen, lying on one side, or taking a warm bath may help to get rid of the pain.  Get help from your health care provider if the pain does not go away or if you have vaginal bleeding, nausea, vomiting, diarrhea, or painful urination. This information is not intended to replace advice given to you by  your health care provider. Make sure you discuss any questions you have with your health care provider. Document Revised: 09/15/2017 Document Reviewed: 09/15/2017 Elsevier Patient Education  [employer].

## 2019-05-18 NOTE — MAU Note (Signed)
Pt reporting piercing pain in lower abdomen and vagina when urinating this morning.  Denies any flank pain. Denies bleeding. Clear vaginal discharge.

## 2019-05-18 NOTE — MAU Provider Note (Signed)
History     CSN: [id number]  Arrival date and time: 05/18/19 1155   First Provider Initiated Contact with Patient 05/18/19 1228      Chief Complaint  Patient presents with  . Abdominal Pain   28 y.o. G3P1011 @15 .4 wks presenting with dysuria and LAP. Pain started today. Pain only occurs with urination. Rates 7/10. Has not tried anything for the pain. Reports urinary frequency since last night. Denies VB or discharge. No fevers. No GI sx.   OB History    Gravida  3   Para  1   Term  1   Preterm      AB  1   Living  1     SAB  1   TAB      Ectopic      Multiple      Live Births  1           Past Medical History:  Diagnosis Date  . Anemia   . Inguinal hernia 2019  . Scoliosis 2006    Past Surgical History:  Procedure Laterality Date  . CESAREAN SECTION      No family history on file.  Social History   Tobacco Use  . Smoking status: Never Smoker  . Smokeless tobacco: Never Used  Substance Use Topics  . Alcohol use: Never  . Drug use: Never    Allergies: No Known Allergies  Medications Prior to Admission  Medication Sig Dispense Refill Last Dose  . Prenatal Vit-Fe Fumarate-FA (MULTIVITAMIN-PRENATAL) 27-0.8 MG TABS tablet Take 1 tablet by mouth daily at 12 noon.   05/18/2019 at Unknown time  . ferrous sulfate 325 (65 FE) MG tablet Take 325 mg by mouth every other day.       Review of Systems  Constitutional: Negative for chills and fever.  Gastrointestinal: Positive for abdominal pain. Negative for constipation, diarrhea, nausea and vomiting.  Genitourinary: Positive for dysuria and frequency. Negative for hematuria, urgency, vaginal bleeding and vaginal discharge.   Physical Exam   Blood pressure 110/60, pulse 80, temperature 98.1 F (36.7 C), temperature source Oral, resp. rate 16, height 5\' 1"  (1.549 m), weight 52.4 kg, last menstrual period 01/29/2019, SpO2 99 %.  Physical Exam  Nursing note and vitals reviewed. Constitutional: She  is oriented to person, place, and time. She appears well-developed and well-nourished. No distress.  HENT:  Head: Normocephalic and atraumatic.  Cardiovascular: Normal rate.  Respiratory: Effort normal. No respiratory distress.  GI: Soft. She exhibits no distension and no mass. There is no abdominal tenderness. There is no rebound and no guarding.  Genitourinary:    Genitourinary Comments: VE: closed/long   Musculoskeletal:        General: Normal range of motion.     Cervical back: Normal range of motion.  Neurological: She is alert and oriented to person, place, and time.  Skin: Skin is warm and dry.  Psychiatric: She has a normal mood and affect.  FHT 146  Results for orders placed or performed during the hospital encounter of 05/18/19 (from the past 24 hour(s))  Urinalysis, Routine w reflex microscopic     Status: Abnormal   Collection Time: 05/18/19 12:14 PM  Result Value Ref Range   Color, Urine YELLOW YELLOW   APPearance HAZY (A) CLEAR   Specific Gravity, Urine 1.016 1.005 - 1.030   pH 6.0 5.0 - 8.0   Glucose, UA NEGATIVE NEGATIVE mg/dL   Hgb urine dipstick NEGATIVE NEGATIVE   Bilirubin Urine NEGATIVE NEGATIVE  Ketones, ur NEGATIVE NEGATIVE mg/dL   Protein, ur NEGATIVE NEGATIVE mg/dL   Nitrite NEGATIVE NEGATIVE   Leukocytes,Ua NEGATIVE NEGATIVE   MAU Course  Procedures  MDM Labs ordered and reviewed. GC recently negative, not repeated. No clear indication of UTI, pain could by RL. Discussed conservative treatment. Will Rx Pyridium for sx until urine culture back. Stable for discharge home.   Assessment and Plan  [redacted] weeks gestation Dysuria Round ligament pain Discharge home Follow up at [facility] as scheduled SAB precautions Rx Pyridium  Allergies as of 05/18/2019   No Known Allergies     Medication List    TAKE these medications   ferrous sulfate 325 (65 FE) MG tablet Take 325 mg by mouth every other day.   multivitamin-prenatal 27-0.8 MG Tabs tablet Take  1 tablet by mouth daily at 12 noon.   phenazopyridine 200 MG tablet Commonly known as: PYRIDIUM Take 1 tablet (200 mg total) by mouth 3 (three) times daily.       Julianne Handler, CNM 05/18/2019, 12:38 PM

## 2019-05-19 LAB — CULTURE, OB URINE: Culture: NO GROWTH

## 2019-06-08 ENCOUNTER — Encounter: Payer: Self-pay | Admitting: Advanced Practice Midwife

## 2019-06-08 ENCOUNTER — Ambulatory Visit (HOSPITAL_COMMUNITY)
Admission: RE | Admit: 2019-06-08 | Discharge: 2019-06-08 | Disposition: A | Discharge status: Home / Self Care | Payer: 59 | Source: Ambulatory Visit | Attending: Obstetrics and Gynecology | Admitting: Obstetrics and Gynecology

## 2019-06-08 ENCOUNTER — Ambulatory Visit (INDEPENDENT_AMBULATORY_CARE_PROVIDER_SITE_OTHER): Payer: PRIVATE HEALTH INSURANCE | Admitting: Advanced Practice Midwife

## 2019-06-08 ENCOUNTER — Other Ambulatory Visit: Payer: Self-pay

## 2019-06-08 DIAGNOSIS — Z3A18 18 weeks gestation of pregnancy: Secondary | ICD-10-CM

## 2019-06-08 DIAGNOSIS — Z3482 Encounter for supervision of other normal pregnancy, second trimester: Secondary | ICD-10-CM

## 2019-06-08 DIAGNOSIS — O34219 Maternal care for unspecified type scar from previous cesarean delivery: Secondary | ICD-10-CM

## 2019-06-08 DIAGNOSIS — Z3480 Encounter for supervision of other normal pregnancy, unspecified trimester: Secondary | ICD-10-CM

## 2019-06-08 DIAGNOSIS — Z363 Encounter for antenatal screening for malformations: Secondary | ICD-10-CM

## 2019-06-08 NOTE — Progress Notes (Signed)
   PRENATAL VISIT NOTE  Subjective:  Michelle Velez is a 29 y.o. G3P1011 at [redacted]w[redacted]d being seen today for ongoing prenatal care.  She is currently monitored for the following issues for this low-risk pregnancy and has Supervision of other normal pregnancy, antepartum and History of cesarean delivery on their problem list.  Patient reports no complaints.  Contractions: Not present. Vag. Bleeding: None.  Movement: Present. Denies leaking of fluid.   The following portions of the patient's history were reviewed and updated as appropriate: allergies, current medications, past family history, past medical history, past social history, past surgical history and problem list.   Objective:   Vitals:   06/08/19 1430  BP: 116/71  Pulse: 73  Weight: 122 lb 4.8 oz (55.5 kg)     Fetal Status: Fetal Heart Rate (bpm): 145   Movement: Present     General:  Alert, oriented and cooperative. Patient is in no acute distress.  Skin: Skin is warm and dry. No rash noted.   Cardiovascular: Normal heart rate noted  Respiratory: Normal respiratory effort, no problems with respiration noted  Abdomen: Soft, gravid, appropriate for gestational age.  Pain/Pressure: Absent     Pelvic: Cervical exam deferred        Extremities: Normal range of motion.  Edema: None  Mental Status: Normal mood and affect. Normal behavior. Normal judgment and thought content.   Assessment and Plan:  Pregnancy: G3P1011 at [redacted]w[redacted]d 1. Supervision of other normal pregnancy, antepartum - AFP, Serum, Open Spina Bifida - Anatomy US today - Patient has purchased  BP cuff   Preterm labor symptoms and general obstetric precautions including but not limited to vaginal bleeding, contractions, leaking of fluid and fetal movement were reviewed in detail with the patient. Please refer to After Visit Summary for other counseling recommendations.   Return in about 4 weeks (around 07/06/2019) for virtual visit .  No future appointments.  Thressa Sheller DNP, CNM  06/08/19  2:39 PM

## 2019-06-08 NOTE — Patient Instructions (Signed)

## 2019-06-10 LAB — AFP, SERUM, OPEN SPINA BIFIDA
AFP MoM: 1.04
AFP Value: 58.7 ng/mL
Gest. Age on Collection Date: 18.1 weeks
Maternal Age At EDD: 30.4 yr
OSBR Risk 1 IN: 10000
Test Results:: NEGATIVE
Weight: 120 [lb_av]

## 2019-06-13 ENCOUNTER — Telehealth: Payer: Self-pay

## 2019-06-13 NOTE — Telephone Encounter (Signed)
Pt called asking if she should be concerned that she may have possibly lost her mucous plug.  Left message for pt that I am returning her call if she continues to have questions or concerns to please give the office a call or send a MyChart message.

## 2019-06-13 NOTE — Telephone Encounter (Signed)
Pt returned call and I informed her that at 19 weeks she should have not lost her mucous plug however it would not be abnormal for her to have a mucousy discharge at times.  I advised pt to continue to monitor her discharge and that if she starts to have itchiness or odor we can evaluate her.  Pt verbalized understanding.   Addison Naegeli, RN 06/13/19

## 2019-07-07 ENCOUNTER — Other Ambulatory Visit: Payer: Self-pay

## 2019-07-07 ENCOUNTER — Telehealth (INDEPENDENT_AMBULATORY_CARE_PROVIDER_SITE_OTHER): Payer: PRIVATE HEALTH INSURANCE | Admitting: Family Medicine

## 2019-07-07 VITALS — BP 104/67 | HR 69 | Wt 125.2 lb

## 2019-07-07 DIAGNOSIS — O34219 Maternal care for unspecified type scar from previous cesarean delivery: Secondary | ICD-10-CM

## 2019-07-07 DIAGNOSIS — Z3480 Encounter for supervision of other normal pregnancy, unspecified trimester: Secondary | ICD-10-CM

## 2019-07-07 DIAGNOSIS — Z98891 History of uterine scar from previous surgery: Secondary | ICD-10-CM

## 2019-07-07 DIAGNOSIS — Z3A22 22 weeks gestation of pregnancy: Secondary | ICD-10-CM

## 2019-07-07 NOTE — Progress Notes (Signed)
OBSTETRICS PRENATAL VIRTUAL VISIT ENCOUNTER NOTE  Provider location: Center for [facility] [place]   I connected with [name] on 07/07/19 at  9:55 AM EDT by MyChart Video Encounter at home and verified that I am speaking with the correct person using two identifiers.   I discussed the limitations, risks, security and privacy concerns of performing an evaluation and management service virtually and the availability of in person appointments. I also discussed with the patient that there may be a patient responsible charge related to this service. The patient expressed understanding and agreed to proceed. Subjective:  Michelle Velez is a 29 y.o. G3P1011 at [redacted]w[redacted]d being seen today for ongoing prenatal care.  She is currently monitored for the following issues for this low-risk pregnancy and has Supervision of other normal pregnancy, antepartum and History of cesarean delivery on their problem list.  Patient reports leg spasms at night.  Contractions: Not present. Vag. Bleeding: None.  Movement: Present. Denies any leaking of fluid.   The following portions of the patient's history were reviewed and updated as appropriate: allergies, current medications, past family history, past medical history, past social history, past surgical history and problem list.   Objective:   Vitals:   07/07/19 1013  BP: 104/67  Pulse: 69  Weight: 125 lb 3.2 oz (56.8 kg)    Fetal Status:     Movement: Present     General:  Alert, oriented and cooperative. Patient is in no acute distress.  Respiratory: Normal respiratory effort, no problems with respiration noted  Mental Status: Normal mood and affect. Normal behavior. Normal judgment and thought content.  Rest of physical exam deferred due to type of encounter  Imaging: [place] MFM OB COMP + 14 WK  Result Date: 06/08/2019 ----------------------------------------------------------------------  OBSTETRICS REPORT                       (Signed Final  06/08/2019 01:47 pm) ---------------------------------------------------------------------- Patient Info  ID #:       [id number]                          D.O.B.:  1990-10-12 (29 yrs)  Name:       Michelle Velez                    Visit Date: 06/08/2019 12:34 pm ---------------------------------------------------------------------- Performed By  Performed By:     Tomma Lightning             Ref. Address:     [address] [place],                                                             [place] [address]  Attending:        Noralee Space  MD        [facility]  Referred By:      Duane LopeJENNIFER I                    RASCH NP ---------------------------------------------------------------------- Orders   #  Description                          Code         Ordered By   1  US MFM OB COMP + 14 WK               76805.01     JENNIFER RASCH  ----------------------------------------------------------------------   #  Order #                    Accession #                 Episode #   1  [id number]                  [id number]                  [id number]  ---------------------------------------------------------------------- Indications   Encounter for antenatal screening for          Z36.3   malformations   [redacted] weeks gestation of pregnancy                Z3A.18   History of cesarean delivery, currently        O34.219   pregnant   Low Risk NIPS  ---------------------------------------------------------------------- Fetal Evaluation  Num Of Fetuses:         1  Fetal Heart Rate(bpm):  148  Cardiac Activity:       Observed  Presentation:           Variable  Placenta:               Posterior  P. Cord Insertion:      Visualized  Amniotic Fluid  AFI FV:      Within normal limits                              Largest Pocket(cm)                              3.53  ---------------------------------------------------------------------- Biometry  BPD:      42.4  mm     G. Age:  18w 6d         63  %    CI:        73.37   %    70 - 86  FL/HC:      17.6   %    16.1 - 18.3  HC:      157.3  mm     G. Age:  18w 4d         45  %    HC/AC:      1.20        1.09 - 1.39  AC:      130.7  mm     G. Age:  18w 4d         47  %    FL/BPD:     65.3   %  FL:       27.7  mm     G. Age:  18w 3d         40  %    FL/AC:      21.2   %    20 - 24  HUM:      26.3  mm     G. Age:  18w 2d         45  %  CER:      19.7  mm     G. Age:  18w 6d         60  %  NFT:       3.6  mm  LV:        5.6  mm  CM:        3.7  mm  Est. FW:     246  gm      0 lb 9 oz     45  % ---------------------------------------------------------------------- OB History  Gravidity:    3         Term:   1         SAB:   1  Living:       1 ---------------------------------------------------------------------- Gestational Age  LMP:           18w 4d        Date:  01/29/19                 EDD:   11/05/19  U/S Today:     18w 4d                                        EDD:   11/05/19  Best:          18w 4d     Det. By:  LMP  (01/29/19)          EDD:   11/05/19 ---------------------------------------------------------------------- Anatomy  Cranium:               Appears normal         LVOT:                   Appears normal  Cavum:                 Appears normal         Aortic Arch:            Appears normal  Ventricles:            Appears normal         Ductal Arch:            Appears normal  Choroid Plexus:        Appears normal  Diaphragm:              Appears normal  Cerebellum:            Appears normal         Stomach:                Appears normal, left                                                                        sided  Posterior Fossa:       Appears normal         Abdomen:                Appears normal  Nuchal Fold:           Appears normal         Abdominal Wall:          Appears nml (cord                                                                        insert, abd wall)  Face:                  Appears normal         Cord Vessels:           Appears normal (3                         (orbits and profile)                           vessel cord)  Lips:                  Appears normal         Kidneys:                Appear normal  Palate:                Not well visualized    Bladder:                Appears normal  Thoracic:              Appears normal         Spine:                  Appears normal  Heart:                 Appears normal         Upper Extremities:      Appears normal                         (4CH, axis, and                         situs)  RVOT:                  Appears normal         Lower Extremities:      Appears normal  Other:  Female gender. Open hands visualized. Heels and 5th digit visualized. ---------------------------------------------------------------------- Cervix Uterus Adnexa  Cervix  Length:            4.4  cm.  Normal appearance by transabdominal scan.  Uterus  No abnormality visualized.  Left Ovary  Size(cm)       2.9  x   1.9    x  2.2       Vol(ml): 6.35  Within normal limits.  Right Ovary  Size(cm)       3.6  x   2.2    x  2.6       Vol(ml): 10.78  Within normal limits. ---------------------------------------------------------------------- Impression  We performed fetal anatomy scan. No makers of  aneuploidies or fetal structural defects are seen. Fetal  biometry is consistent with her previously-established dates.  Amniotic fluid is normal and good fetal activity is seen.  Patient understands the limitations of ultrasound in detecting  fetal anomalies.  Placenta is posterior and there is no evidence of previa or  accreta.  On cell-free fetal DNA screening, the risks of fetal  aneuploidies are not increased. ---------------------------------------------------------------------- Recommendations  Follow-up scans as clinically indicated.  ----------------------------------------------------------------------                  Noralee Space, MD Electronically Signed Final Report   06/08/2019 01:47 pm ----------------------------------------------------------------------   Assessment and Plan:  Pregnancy: G3P1011 at [redacted]w[redacted]d 1. Supervision of other normal pregnancy, antepartum Good fetal activity - Ambulatory referral to Integrated Behavioral Health  2. History of cesarean delivery TOLAC discussed.  Desires TOLAC. Will have her sign form next visit  Preterm labor symptoms and general obstetric precautions including but not limited to vaginal bleeding, contractions, leaking of fluid and fetal movement were reviewed in detail with the patient. I discussed the assessment and treatment plan with the patient. The patient was provided an opportunity to ask questions and all were answered. The patient agreed with the plan and demonstrated an understanding of the instructions. The patient was advised to call back or seek an in-person office evaluation/go to [facility]U at [facility] for any urgent or concerning symptoms. Please refer to After Visit Summary for other counseling recommendations.   I provided 13 minutes of face-to-face time during this encounter.  Return in about 4 weeks (around 08/04/2019) for MD Visit to Discuss VBAC/ 2 Weeks with Marcos Eke.  Future Appointments  Date Time Provider Department Center  07/24/2019  2:15 PM [facility] [place] [facility]  08/08/2019 10:55 AM Hermina Staggers, MD [facility]    Levie Heritage, DO Center for [facility]

## 2019-07-07 NOTE — Progress Notes (Signed)
I connected with  Michelle Velez on 07/07/19 at  9:55 AM EDT by telephone and verified that I am speaking with the correct person using two identifiers.   I discussed the limitations, risks, security and privacy concerns of performing an evaluation and management service by telephone and the availability of in person appointments. I also discussed with the patient that there may be a patient responsible charge related to this service. The patient expressed understanding and agreed to proceed.  Janene Madeira [name], CMA 07/07/2019  10:12 AM   States her legs hurts at night  Feelings of depression and anxiety. Did PHQ9 and GAD7 and [place] to access need. All are elevated.  Referral to [place].

## 2019-07-20 NOTE — BH Specialist Note (Signed)
Integrated Behavioral Health via Telemedicine Video Visit  07/20/2019 Michelle Velez [id number]  Number of Integrated Behavioral Health visits: 1 Session Start time: 2:18  Session End time: 3:14 Total time: 51  Referring Provider: Candelaria Celeste, DO Type of Visit: Video Patient/Family location: Home [facility] Provider location: [facility] All persons participating in visit: Patient Michelle Velez and [facility] Jalilah Wiltsie    Confirmed patient's address: Yes  Confirmed patient's phone number: Yes  Any changes to demographics: No   Confirmed patient's insurance: Yes  Any changes to patient's insurance: No   Discussed confidentiality: Yes   I connected with Michelle Velez  by a video enabled telemedicine application and verified that I am speaking with the correct person using two identifiers.     I discussed the limitations of evaluation and management by telemedicine and the availability of in person appointments.  I discussed that the purpose of this visit is to provide behavioral health care while limiting exposure to the novel coronavirus.   Discussed there is a possibility of technology failure and discussed alternative modes of communication if that failure occurs.  I discussed that engaging in this video visit, they consent to the provision of behavioral healthcare and the services will be billed under their insurance.  Patient and/or legal guardian expressed understanding and consented to video visit: Yes   PRESENTING CONCERNS: Patient and/or family reports the following symptoms/concerns: Pt states her primary concern today is escalating worry and anxiety, attributed to life stress([place]); also concerned about pain in leg at night, that makes sleep difficult. Pt prefers self-coping strategies.  Duration of problem: Current pregnancy; Severity of problem: moderately severe  STRENGTHS (Protective Factors/Coping Skills): Supportive family  GOALS ADDRESSED: Patient will: 1.   Reduce symptoms of: anxiety and stress  2.  Increase knowledge and/or ability of: healthy habits and self-management skills  3.  Demonstrate ability to: Increase healthy adjustment to current life circumstances  INTERVENTIONS: Interventions utilized:  Mindfulness or Management consultant, Brief CBT and Psychoeducation and/or Health Education Standardized Assessments completed: PHQ9/GAD7 given in past two weeks  ASSESSMENT: Patient currently experiencing Anxiety disorder, unspecified.   Patient may benefit from psychoeducation and brief therapeutic interventions regarding coping with symptoms of anxiety and life stress .  PLAN: 1. Follow up with behavioral health clinician on : Two weeks 2. Behavioral recommendations:  -Begin Worry Time strategy today, as discussed, to prioritize life stressors -CALM relaxation breathing exercise, twice daily (morning; at bedtime with sleep sounds) -Continue taking prenatal vitamin daily -View video tour of [facility] via [email or web address] 3. Referral(s): Integrated [employer] (In Clinic)  I discussed the assessment and treatment plan with the patient and/or parent/guardian. They were provided an opportunity to ask questions and all were answered. They agreed with the plan and demonstrated an understanding of the instructions.   They were advised to call back or seek an in-person evaluation if the symptoms worsen or if the condition fails to improve as anticipated.  Michelle Velez Michelle Velez  Depression screen [facility] 2/9 07/07/2019 06/08/2019  Decreased Interest 1 0  Down, Depressed, Hopeless 1 0  PHQ - 2 Score 2 0  Altered sleeping 3 1  Tired, decreased energy 3 0  Change in appetite 1 0  Feeling bad or failure about yourself  0 0  Trouble concentrating 0 0  Moving slowly or fidgety/restless 0 0  Suicidal thoughts 0 0  PHQ-9 Score 9 1   GAD 7 : Generalized Anxiety Score 07/07/2019 06/08/2019  Nervous, Anxious, on  Edge 2 0   Control/stop worrying 3 0  Worry too much - different things 3 1  Trouble relaxing 3 0  Restless 2 0  Easily annoyed or irritable 3 0  Afraid - awful might happen 3 0  Total GAD 7 Score 19 1

## 2019-07-24 ENCOUNTER — Ambulatory Visit (INDEPENDENT_AMBULATORY_CARE_PROVIDER_SITE_OTHER): Payer: 59 | Admitting: Clinical

## 2019-07-24 DIAGNOSIS — F419 Anxiety disorder, unspecified: Secondary | ICD-10-CM

## 2019-07-24 NOTE — Patient Instructions (Signed)

## 2019-07-25 ENCOUNTER — Telehealth: Payer: Self-pay | Admitting: Clinical

## 2019-07-25 NOTE — Telephone Encounter (Signed)
Follow up call, as requested. Pt agrees to take OTC iron pills, as recommended by medical provider, to help with leg pain, and will discuss with medical provider further at next medical visit, as needed.

## 2019-07-27 NOTE — BH Specialist Note (Addendum)
Integrated Behavioral Health via Telemedicine Video Visit  07/27/2019 Michelle Velez [id number]  Number of Integrated Behavioral Health visits: 2 Session Start time: 9:19  Session End time: 9:56 Total time: 37  Referring Provider: Candelaria Celeste, DO Type of Visit: Video Patient/Family location: Home [facility] Provider location: [facility] All persons participating in visit: Patient Michelle Velez and Michelle Velez    Confirmed patient's address: Yes  Confirmed patient's phone number: Yes  Any changes to demographics: No   Confirmed patient's insurance: Yes  Any changes to patient's insurance: No   Discussed confidentiality: at previous visit  I connected with [name]  by a video enabled telemedicine application and verified that I am speaking with the correct person using two identifiers.     I discussed the limitations of evaluation and management by telemedicine and the availability of in person appointments.  I discussed that the purpose of this visit is to provide behavioral health care while limiting exposure to the novel coronavirus.   Discussed there is a possibility of technology failure and discussed alternative modes of communication if that failure occurs.  I discussed that engaging in this video visit, they consent to the provision of behavioral healthcare and the services will be billed under their insurance.  Patient and/or legal guardian expressed understanding and consented to video visit: Yes   PRESENTING CONCERNS: Patient and/or family reports the following symptoms/concerns: Pt states her primary concern today is ongoing interpersonal conflict with daughter's father, and continuing to have leg pain affecting her sleep; pt copes with stress by doing crossword puzzles.  Duration of problem: Current pregnancy; Severity of problem: moderate  STRENGTHS (Protective Factors/Coping Skills): Supportive family; utilizing puzzles for self-coping  GOALS ADDRESSED: Patient  will: 1.  Reduce symptoms of: anxiety and stress  2.  Demonstrate ability to: Increase healthy adjustment to current life circumstances  INTERVENTIONS: Interventions utilized:  Solution-Focused Strategies and Psychoeducation and/or Health Education Standardized Assessments completed: GAD-7 and PHQ 9  ASSESSMENT: Patient currently experiencing Anxiety disorder.   Patient may benefit from continued psychoeducation and brief therapeutic interventions regarding coping with symptoms of anxiety and life stress .  PLAN: 1. Follow up with behavioral health clinician on : One month 2. Behavioral recommendations:  -Complete at least one crossword puzzle daily, for as long as remains helpful -Consider continuing relaxation breathing exercise and worry time strategy daily, as additional self-coping strategies -Continue to take prenatal vitamin daily 3. Referral(s): Integrated [employer] (In Clinic)  I discussed the assessment and treatment plan with the patient and/or parent/guardian. They were provided an opportunity to ask questions and all were answered. They agreed with the plan and demonstrated an understanding of the instructions.   They were advised to call back or seek an in-person evaluation if the symptoms worsen or if the condition fails to improve as anticipated.  Michelle Velez Michelle Velez  Depression screen [facility] 2/9 08/07/2019 07/07/2019 06/08/2019  Decreased Interest 1 1 0  Down, Depressed, Hopeless 1 1 0  PHQ - 2 Score 2 2 0  Altered sleeping 1 3 1   Tired, decreased energy 1 3 0  Change in appetite 3 1 0  Feeling bad or failure about yourself  1 0 0  Trouble concentrating 0 0 0  Moving slowly or fidgety/restless 0 0 0  Suicidal thoughts 0 0 0  PHQ-9 Score 8 9 1    GAD 7 : Generalized Anxiety Score 08/07/2019 07/07/2019 06/08/2019  Nervous, Anxious, on Edge 1 2 0  Control/stop worrying 1 3  0  Worry too much - different things 1 3 1   Trouble relaxing 1 3 0  Restless 0 2  0  Easily annoyed or irritable 1 3 0  Afraid - awful might happen 1 3 0  Total GAD 7 Score 6 19 1

## 2019-08-07 ENCOUNTER — Ambulatory Visit (HOSPITAL_BASED_OUTPATIENT_CLINIC_OR_DEPARTMENT_OTHER): Payer: 59 | Admitting: Clinical

## 2019-08-07 DIAGNOSIS — F419 Anxiety disorder, unspecified: Secondary | ICD-10-CM

## 2019-08-08 ENCOUNTER — Encounter: Payer: Self-pay | Admitting: Obstetrics and Gynecology

## 2019-08-08 ENCOUNTER — Other Ambulatory Visit: Payer: Self-pay

## 2019-08-08 ENCOUNTER — Encounter: Payer: Self-pay | Admitting: *Deleted

## 2019-08-08 ENCOUNTER — Ambulatory Visit (INDEPENDENT_AMBULATORY_CARE_PROVIDER_SITE_OTHER): Payer: PRIVATE HEALTH INSURANCE | Admitting: Obstetrics and Gynecology

## 2019-08-08 ENCOUNTER — Other Ambulatory Visit: Payer: PRIVATE HEALTH INSURANCE

## 2019-08-08 VITALS — BP 107/65 | HR 83 | Wt 131.0 lb

## 2019-08-08 DIAGNOSIS — Z3480 Encounter for supervision of other normal pregnancy, unspecified trimester: Secondary | ICD-10-CM

## 2019-08-08 DIAGNOSIS — Z23 Encounter for immunization: Secondary | ICD-10-CM | POA: Diagnosis not present

## 2019-08-08 DIAGNOSIS — Z3A27 27 weeks gestation of pregnancy: Secondary | ICD-10-CM

## 2019-08-08 DIAGNOSIS — O34219 Maternal care for unspecified type scar from previous cesarean delivery: Secondary | ICD-10-CM

## 2019-08-08 DIAGNOSIS — Z98891 History of uterine scar from previous surgery: Secondary | ICD-10-CM

## 2019-08-08 NOTE — Patient Instructions (Signed)
Third Trimester of Pregnancy The third trimester is from week 28 through week 40 (months 7 through 9). The third trimester is a time when the unborn baby (fetus) is growing rapidly. At the end of the ninth month, the fetus is about 20 inches in length and weighs 6-10 pounds. Body changes during your third trimester Your body will continue to go through many changes during pregnancy. The changes vary from woman to woman. During the third trimester:  Your weight will continue to increase. You can expect to gain 25-35 pounds (11-16 kg) by the end of the pregnancy.  You may begin to get stretch marks on your hips, abdomen, and breasts.  You may urinate more often because the fetus is moving lower into your pelvis and pressing on your bladder.  You may develop or continue to have heartburn. This is caused by increased hormones that slow down muscles in the digestive tract.  You may develop or continue to have constipation because increased hormones slow digestion and cause the muscles that push waste through your intestines to relax.  You may develop hemorrhoids. These are swollen veins (varicose veins) in the rectum that can itch or be painful.  You may develop swollen, bulging veins (varicose veins) in your legs.  You may have increased body aches in the pelvis, back, or thighs. This is due to weight gain and increased hormones that are relaxing your joints.  You may have changes in your hair. These can include thickening of your hair, rapid growth, and changes in texture. Some women also have hair loss during or after pregnancy, or hair that feels dry or thin. Your hair will most likely return to normal after your baby is born.  Your breasts will continue to grow and they will continue to become tender. A yellow fluid (colostrum) may leak from your breasts. This is the first milk you are producing for your baby.  Your belly button may stick out.  You may notice more swelling in your hands,  face, or ankles.  You may have increased tingling or numbness in your hands, arms, and legs. The skin on your belly may also feel numb.  You may feel short of breath because of your expanding uterus.  You may have more problems sleeping. This can be caused by the size of your belly, increased need to urinate, and an increase in your body's metabolism.  You may notice the fetus "dropping," or moving lower in your abdomen (lightening).  You may have increased vaginal discharge.  You may notice your joints feel loose and you may have pain around your pelvic bone. What to expect at prenatal visits You will have prenatal exams every 2 weeks until week 36. Then you will have weekly prenatal exams. During a routine prenatal visit:  You will be weighed to make sure you and the baby are growing normally.  Your blood pressure will be taken.  Your abdomen will be measured to track your baby's growth.  The fetal heartbeat will be listened to.  Any test results from the previous visit will be discussed.  You may have a cervical check near your due date to see if your cervix has softened or thinned (effaced).  You will be tested for Group B streptococcus. This happens between 35 and 37 weeks. Your health care provider may ask you:  What your birth plan is.  How you are feeling.  If you are feeling the baby move.  If you have had any abnormal   symptoms, such as leaking fluid, bleeding, severe headaches, or abdominal cramping.  If you are using any tobacco products, including cigarettes, chewing tobacco, and electronic cigarettes.  If you have any questions. Other tests or screenings that may be performed during your third trimester include:  Blood tests that check for low iron levels (anemia).  Fetal testing to check the health, activity level, and growth of the fetus. Testing is done if you have certain medical conditions or if there are problems during the pregnancy.  Nonstress test  (NST). This test checks the health of your baby to make sure there are no signs of problems, such as the baby not getting enough oxygen. During this test, a belt is placed around your belly. The baby is made to move, and its heart rate is monitored during movement. What is false labor? False labor is a condition in which you feel small, irregular tightenings of the muscles in the womb (contractions) that usually go away with rest, changing position, or drinking water. These are called Braxton Hicks contractions. Contractions may last for hours, days, or even weeks before true labor sets in. If contractions come at regular intervals, become more frequent, increase in intensity, or become painful, you should see your health care provider. What are the signs of labor?  Abdominal cramps.  Regular contractions that start at 10 minutes apart and become stronger and more frequent with time.  Contractions that start on the top of the uterus and spread down to the lower abdomen and back.  Increased pelvic pressure and dull back pain.  A watery or bloody mucus discharge that comes from the vagina.  Leaking of amniotic fluid. This is also known as your "water breaking." It could be a slow trickle or a gush. Let your health care provider know if it has a color or strange odor. If you have any of these signs, call your health care provider right away, even if it is before your due date. Follow these instructions at home: Medicines  Follow your health care provider's instructions regarding medicine use. Specific medicines may be either safe or unsafe to take during pregnancy.  Take a prenatal vitamin that contains at least 600 micrograms (mcg) of folic acid.  If you develop constipation, try taking a stool softener if your health care provider approves. Eating and drinking   Eat a balanced diet that includes fresh fruits and vegetables, whole grains, good sources of protein such as meat, eggs, or tofu,  and low-fat dairy. Your health care provider will help you determine the amount of weight gain that is right for you.  Avoid raw meat and uncooked cheese. These carry germs that can cause birth defects in the baby.  If you have low calcium intake from food, talk to your health care provider about whether you should take a daily calcium supplement.  Eat four or five small meals rather than three large meals a day.  Limit foods that are high in fat and processed sugars, such as fried and sweet foods.  To prevent constipation: ? Drink enough fluid to keep your urine clear or pale yellow. ? Eat foods that are high in fiber, such as fresh fruits and vegetables, whole grains, and beans. Activity  Exercise only as directed by your health care provider. Most women can continue their usual exercise routine during pregnancy. Try to exercise for 30 minutes at least 5 days a week. Stop exercising if you experience uterine contractions.  Avoid heavy lifting.  Do   not exercise in extreme heat or humidity, or at high altitudes.  Wear low-heel, comfortable shoes.  Practice good posture.  You may continue to have sex unless your health care provider tells you otherwise. Relieving pain and discomfort  Take frequent breaks and rest with your legs elevated if you have leg cramps or low back pain.  Take warm sitz baths to soothe any pain or discomfort caused by hemorrhoids. Use hemorrhoid cream if your health care provider approves.  Wear a good support bra to prevent discomfort from breast tenderness.  If you develop varicose veins: ? Wear support pantyhose or compression stockings as told by your healthcare provider. ? Elevate your feet for 15 minutes, 3-4 times a day. Prenatal care  Write down your questions. Take them to your prenatal visits.  Keep all your prenatal visits as told by your health care provider. This is important. Safety  Wear your seat belt at all times when driving.  Make  a list of emergency phone numbers, including numbers for family, friends, the hospital, and police and fire departments. General instructions  Avoid cat litter boxes and soil used by cats. These carry germs that can cause birth defects in the baby. If you have a cat, ask someone to clean the litter box for you.  Do not travel far distances unless it is absolutely necessary and only with the approval of your health care provider.  Do not use hot tubs, steam rooms, or saunas.  Do not drink alcohol.  Do not use any products that contain nicotine or tobacco, such as cigarettes and e-cigarettes. If you need help quitting, ask your health care provider.  Do not use any medicinal herbs or unprescribed drugs. These chemicals affect the formation and growth of the baby.  Do not douche or use tampons or scented sanitary pads.  Do not cross your legs for long periods of time.  To prepare for the arrival of your baby: ? Take prenatal classes to understand, practice, and ask questions about labor and delivery. ? Make a trial run to the hospital. ? Visit the hospital and tour the maternity area. ? Arrange for maternity or paternity leave through employers. ? Arrange for family and friends to take care of pets while you are in the hospital. ? Purchase a rear-facing car seat and make sure you know how to install it in your car. ? Pack your hospital bag. ? Prepare the baby's nursery. Make sure to remove all pillows and stuffed animals from the baby's crib to prevent suffocation.  Visit your dentist if you have not gone during your pregnancy. Use a soft toothbrush to brush your teeth and be gentle when you floss. Contact a health care provider if:  You are unsure if you are in labor or if your water has broken.  You become dizzy.  You have mild pelvic cramps, pelvic pressure, or nagging pain in your abdominal area.  You have lower back pain.  You have persistent nausea, vomiting, or  diarrhea.  You have an unusual or bad smelling vaginal discharge.  You have pain when you urinate. Get help right away if:  Your water breaks before 37 weeks.  You have regular contractions less than 5 minutes apart before 37 weeks.  You have a fever.  You are leaking fluid from your vagina.  You have spotting or bleeding from your vagina.  You have severe abdominal pain or cramping.  You have rapid weight loss or weight gain.  You have   shortness of breath with chest pain.  You notice sudden or extreme swelling of your face, hands, ankles, feet, or legs.  Your baby makes fewer than 10 movements in 2 hours.  You have severe headaches that do not go away when you take medicine.  You have vision changes. Summary  The third trimester is from week 28 through week 40, months 7 through 9. The third trimester is a time when the unborn baby (fetus) is growing rapidly.  During the third trimester, your discomfort may increase as you and your baby continue to gain weight. You may have abdominal, leg, and back pain, sleeping problems, and an increased need to urinate.  During the third trimester your breasts will keep growing and they will continue to become tender. A yellow fluid (colostrum) may leak from your breasts. This is the first milk you are producing for your baby.  False labor is a condition in which you feel small, irregular tightenings of the muscles in the womb (contractions) that eventually go away. These are called Braxton Hicks contractions. Contractions may last for hours, days, or even weeks before true labor sets in.  Signs of labor can include: abdominal cramps; regular contractions that start at 10 minutes apart and become stronger and more frequent with time; watery or bloody mucus discharge that comes from the vagina; increased pelvic pressure and dull back pain; and leaking of amniotic fluid. This information is not intended to replace advice given to you by your  health care provider. Make sure you discuss any questions you have with your health care provider. Document Revised: 07/21/2018 Document Reviewed: 05/05/2016 Elsevier Patient Education  2020 [employer]  

## 2019-08-08 NOTE — Progress Notes (Signed)
Subjective:  Michelle Velez is a 29 y.o. G3P1011 at [redacted]w[redacted]d being seen today for ongoing prenatal care.  She is currently monitored for the following issues for this low-risk pregnancy and has Supervision of other normal pregnancy, antepartum and History of cesarean delivery on their problem list.  Patient reports no complaints.  Contractions: Not present. Vag. Bleeding: None.  Movement: Present. Denies leaking of fluid.   The following portions of the patient's history were reviewed and updated as appropriate: allergies, current medications, past family history, past medical history, past social history, past surgical history and problem list. Problem list updated.  Objective:   Vitals:   08/08/19 1114  BP: 107/65  Pulse: 83  Weight: 131 lb (59.4 kg)    Fetal Status: Fetal Heart Rate (bpm): 140   Movement: Present     General:  Alert, oriented and cooperative. Patient is in no acute distress.  Skin: Skin is warm and dry. No rash noted.   Cardiovascular: Normal heart rate noted  Respiratory: Normal respiratory effort, no problems with respiration noted  Abdomen: Soft, gravid, appropriate for gestational age. Pain/Pressure: Absent     Pelvic:  Cervical exam deferred        Extremities: Normal range of motion.  Edema: None  Mental Status: Normal mood and affect. Normal behavior. Normal judgment and thought content.   Urinalysis:      Assessment and Plan:  Pregnancy: G3P1011 at [redacted]w[redacted]d  1. Supervision of other normal pregnancy, antepartum Stable Glucola and 28 week labs today  2. History of cesarean delivery TOLAC reviewed and consent obtained  Preterm labor symptoms and general obstetric precautions including but not limited to vaginal bleeding, contractions, leaking of fluid and fetal movement were reviewed in detail with the patient. Please refer to After Visit Summary for other counseling recommendations.  Return in about 2 weeks (around 08/22/2019) for OB visit, virtual, any  provider.   Hermina Staggers, MD

## 2019-08-09 ENCOUNTER — Encounter: Payer: Self-pay | Admitting: *Deleted

## 2019-08-09 LAB — RPR: RPR Ser Ql: NONREACTIVE

## 2019-08-09 LAB — CBC
Hematocrit: 38.2 % (ref 34.0–46.6)
Hemoglobin: 12.9 g/dL (ref 11.1–15.9)
MCH: 31.9 pg (ref 26.6–33.0)
MCHC: 33.8 g/dL (ref 31.5–35.7)
MCV: 94 fL (ref 79–97)
Platelets: 91 10*3/uL — CL (ref 150–450)
RBC: 4.05 x10E6/uL (ref 3.77–5.28)
RDW: 13.2 % (ref 11.7–15.4)
WBC: 9.1 10*3/uL (ref 3.4–10.8)

## 2019-08-09 LAB — GLUCOSE TOLERANCE, 2 HOURS W/ 1HR
Glucose, 1 hour: 131 mg/dL (ref 65–179)
Glucose, 2 hour: 133 mg/dL (ref 65–152)
Glucose, Fasting: 71 mg/dL (ref 65–91)

## 2019-08-09 LAB — HIV ANTIBODY (ROUTINE TESTING W REFLEX): HIV Screen 4th Generation wRfx: NONREACTIVE

## 2019-08-10 ENCOUNTER — Encounter: Payer: Self-pay | Admitting: Obstetrics and Gynecology

## 2019-08-16 ENCOUNTER — Telehealth: Payer: Self-pay | Admitting: *Deleted

## 2019-08-16 NOTE — Telephone Encounter (Addendum)
Pt left VM message on 5/3 stating that she has been having pain in her legs @ night and lower vaginal area since the beginning of her second trimester. She requests a call back.  5/10  I called pt and apologized for [place] not returning her call sooner than today. 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 Pt stated understanding and was not upset. We discussed her concern regarding the pain in her legs. She has been having the pain for awhile and it has not changed. I advised that I was not sure what could be the cause and recommended that she discuss with provider during her visit tomorrow. Pt will also need CBC drawn during the visit. Pt asked if she can taken a prescription acne medication which she was given prior to pregnancy. I advised that she not take it until speaking with the provider and to bring the medication  to the visit tomorrow. She voiced understanding of all information provided.

## 2019-08-21 DIAGNOSIS — Z029 Encounter for administrative examinations, unspecified: Secondary | ICD-10-CM

## 2019-08-22 ENCOUNTER — Other Ambulatory Visit: Payer: Self-pay

## 2019-08-22 ENCOUNTER — Ambulatory Visit (INDEPENDENT_AMBULATORY_CARE_PROVIDER_SITE_OTHER): Payer: 59 | Admitting: Student

## 2019-08-22 VITALS — BP 120/58 | HR 74 | Wt 134.0 lb

## 2019-08-22 DIAGNOSIS — Z3480 Encounter for supervision of other normal pregnancy, unspecified trimester: Secondary | ICD-10-CM

## 2019-08-22 DIAGNOSIS — D696 Thrombocytopenia, unspecified: Secondary | ICD-10-CM | POA: Insufficient documentation

## 2019-08-22 DIAGNOSIS — Z3A29 29 weeks gestation of pregnancy: Secondary | ICD-10-CM

## 2019-08-22 DIAGNOSIS — R102 Pelvic and perineal pain: Secondary | ICD-10-CM

## 2019-08-22 DIAGNOSIS — O99113 Other diseases of the blood and blood-forming organs and certain disorders involving the immune mechanism complicating pregnancy, third trimester: Secondary | ICD-10-CM

## 2019-08-22 DIAGNOSIS — O26893 Other specified pregnancy related conditions, third trimester: Secondary | ICD-10-CM

## 2019-08-22 HISTORY — DX: Thrombocytopenia, unspecified: D69.6

## 2019-08-22 MED ORDER — COMFORT FIT MATERNITY SUPP SM MISC: 1.0000 [IU] | Freq: Every day | 0 refills | Status: DC | PRN

## 2019-08-22 NOTE — Progress Notes (Signed)
   PRENATAL VISIT NOTE  Subjective:  Michelle Velez is a 29 y.o. G3P1011 at 79w2dbeing seen today for ongoing prenatal care.  She is currently monitored for the following issues for this high-risk pregnancy and has Supervision of other normal pregnancy, antepartum; History of cesarean delivery; and Gestational thrombocytopenia without hemorrhage in third trimester ([facility] on their problem list.  Patient reports pelvic & leg pain. Worse at night. Feels like sharp pain in pelvis & vagina, cramping pain in legs..  Contractions: Not present. Vag. Bleeding: None.  Movement: Present. Denies leaking of fluid.   The following portions of the patient's history were reviewed and updated as appropriate: allergies, current medications, past family history, past medical history, past social history, past surgical history and problem list.   Objective:   Vitals:   08/22/19 1405  BP: (!) 120/58  Pulse: 74  Weight: 134 lb (60.8 kg)    Fetal Status: Fetal Heart Rate (bpm): 148   Movement: Present     General:  Alert, oriented and cooperative. Patient is in no acute distress.  Skin: Skin is warm and dry. No rash noted.   Cardiovascular: Normal heart rate noted  Respiratory: Normal respiratory effort, no problems with respiration noted  Abdomen: Soft, gravid, appropriate for gestational age.  Pain/Pressure: Absent     Pelvic: Cervical exam deferred        Extremities: Normal range of motion.  Edema: None  Mental Status: Normal mood and affect. Normal behavior. Normal judgment and thought content.   Assessment and Plan:  Pregnancy: G3P1011 at 265w2d. Gestational thrombocytopenia without hemorrhage in third trimester ([facility] - CBC - Comp Met (CMET) - Protein / creatinine ratio, urine - Fibrinogen - Protime-INR ( [facility] ONLY) - PTT  2. Supervision of other normal pregnancy, antepartum   3. Pelvic pain affecting pregnancy in third trimester, antepartum  - Elastic Bandages & Supports (COMFORT FIT  MATERNITY SUPP SM) MISC; 1 Units by Does not apply route daily as needed.  Dispense: 1 each; Refill: 0  Preterm labor symptoms and general obstetric precautions including but not limited to vaginal bleeding, contractions, leaking of fluid and fetal movement were reviewed in detail with the patient. Please refer to After Visit Summary for other counseling recommendations.   Return in about 2 weeks (around 09/05/2019) for Routine OB, with MD, can be virtual.  Future Appointments  Date Time Provider [place]5/24/2021 10:45 AM [facility]5/26/[facility]21  2:15 PM EcClarnce FlockMD [facility]  ErJorje GuildNP

## 2019-08-22 NOTE — Patient Instructions (Addendum)
PREGNANCY SUPPORT BELT: You are not alone, Seventy-five percent of women have some sort of abdominal or back pain at some point in their pregnancy. Your baby is growing at a fast pace, which means that your whole body is rapidly trying to adjust to the changes. As your uterus grows, your back may start feeling a bit under stress and this can result in back or abdominal pain that can go from mild, and therefore bearable, to severe pains that will not allow you to sit or lay down comfortably, When it comes to dealing with pregnancy-related pains and cramps, some pregnant women usually prefer natural remedies, which the market is filled with nowadays. For example, wearing a pregnancy support belt can help ease and lessen your discomfort and pain. WHAT ARE THE BENEFITS OF WEARING A PREGNANCY SUPPORT BELT? A pregnancy support belt provides support to the lower portion of the belly taking some of the weight of the growing uterus and distributing to the other parts of your body. It is designed make you comfortable and gives you extra support. Over the years, the pregnancy apparel market has been studying the needs and wants of pregnant women and they have come up with the most comfortable pregnancy support belts that woman could ever ask for. In fact, you will no longer have to wear a stretched-out or bulky pregnancy belt that is visible underneath your clothes and makes you feel even more uncomfortable. Nowadays, a pregnancy support belt is made of comfortable and stretchy materials that will not irritate your skin but will actually make you feel at ease and you will not even notice you are wearing it. They are easy to put on and adjust during the day and can be worn at night for additional support.  BENEFITS: . Relives Back pain . Relieves Abdominal Muscle and Leg Pain . Stabilizes the Pelvic Ring . Offers a Cushioned Abdominal Lift Pad . Relieves pressure on the Sciatic Nerve Within Minutes WHERE TO GET  YOUR PREGNANCY BELT: [employer] [phone] @[id number]  [address] [place], [place]     Thrombocytopenia Thrombocytopenia means that you have a low number of platelets in your blood. Platelets are tiny cells in the blood. When you bleed, they clump together at the cut or injury to stop the bleeding. This is called blood clotting. If you do not have enough platelets, it can cause bleeding problems. Some cases of this condition are mild while others are more severe. What are the causes? This condition may be caused by:  Your body not making enough platelets. This may be caused by: ? Your bone marrow not making blood cells (aplastic anemia). ? Cancer in the bone marrow. ? Certain medicines. ? Infection in the bone marrow. ? Drinking a lot of alcohol.  Your body destroying platelets too quickly. This may be caused by: ? Certain immune diseases. ? Certain medicines. ? Certain blood clotting disorders. ? Certain disorders that are passed from parent to child (inherited). ? Certain bleeding disorders. ? Pregnancy. ? Having a spleen that is larger than normal. What are the signs or symptoms?  Bleeding that is not normal.  Nosebleeds.  Heavy menstrual periods.  Blood in the pee (urine) or poop (stool).  A purple-like color to the skin (purpura).  Bruising.  A rash that looks like pinpoint, purple-red spots (petechiae). How is this treated?  Treatment of another condition that is causing the low platelet count.  Medicines to help protect your platelets from  being destroyed.  A replacement (transfusion) of platelets to stop or prevent bleeding.  Surgery to remove the spleen. Follow these instructions at home: Activity  Avoid activities that could cause you to get hurt or bruised. Follow instructions about how to prevent falls.  Take care not to cut yourself: ? When you shave. ? When you use scissors, needles, knives, or other tools.  Take care not  to burn yourself: ? When you use an iron. ? When you cook. General instructions   Check your skin and the inside of your mouth for bruises or blood as told by your doctor.  Check to see if there is blood in your spit (sputum), pee, and poop. Do this as told by your doctor.  Do not drink alcohol.  Take over-the-counter and prescription medicines only as told by your doctor.  Do not take any medicines that have aspirin or NSAIDs in them. These medicines can thin your blood and cause you to bleed.  Tell all of your doctors that you have this condition. Be sure to tell your dentist and eye doctor too. Contact a doctor if:  You have bruises and you do not know why. Get help right away if:  You are bleeding anywhere on your body.  You have blood in your spit, pee, or poop. Summary  Thrombocytopenia means that you have a low number of platelets in your blood.  Platelets are needed for blood clotting.  Symptoms of this condition include bleeding that is not normal, and bruising.  Take care not to cut or burn yourself. This information is not intended to replace advice given to you by your health care provider. Make sure you discuss any questions you have with your health care provider. Document Revised: 12/30/2017 Document Reviewed: 12/30/2017 Elsevier Patient Education  [place].

## 2019-08-23 LAB — COMPREHENSIVE METABOLIC PANEL
ALT: 9 IU/L (ref 0–32)
AST: 14 IU/L (ref 0–40)
Albumin/Globulin Ratio: 1.6 (ref 1.2–2.2)
Albumin: 3.8 g/dL — ABNORMAL LOW (ref 3.9–5.0)
Alkaline Phosphatase: 78 IU/L (ref 39–117)
BUN/Creatinine Ratio: 12 (ref 9–23)
BUN: 6 mg/dL (ref 6–20)
Bilirubin Total: <0.2 mg/dL (ref 0.0–1.2)
CO2: 22 mmol/L (ref 20–29)
Calcium: 9 mg/dL (ref 8.7–10.2)
Chloride: 102 mmol/L (ref 96–106)
Creatinine, Ser: 0.49 mg/dL — ABNORMAL LOW (ref 0.57–1.00)
GFR calc Af Amer: 151 mL/min/{1.73_m2} (ref >59)
GFR calc non Af Amer: 131 mL/min/{1.73_m2} (ref >59)
Globulin, Total: 2.4 g/dL (ref 1.5–4.5)
Glucose: 77 mg/dL (ref 65–99)
Potassium: 4 mmol/L (ref 3.5–5.2)
Sodium: 138 mmol/L (ref 134–144)
Total Protein: 6.2 g/dL (ref 6.0–8.5)

## 2019-08-23 LAB — CBC
Hematocrit: 39 % (ref 34.0–46.6)
Hemoglobin: 13.1 g/dL (ref 11.1–15.9)
MCH: 32.2 pg (ref 26.6–33.0)
MCHC: 33.6 g/dL (ref 31.5–35.7)
MCV: 96 fL (ref 79–97)
Platelets: 133 10*3/uL — ABNORMAL LOW (ref 150–450)
RBC: 4.07 x10E6/uL (ref 3.77–5.28)
RDW: 12.5 % (ref 11.7–15.4)
WBC: 10.1 10*3/uL (ref 3.4–10.8)

## 2019-08-23 LAB — PROTEIN / CREATININE RATIO, URINE
Creatinine, Urine: 61.2 mg/dL
Protein, Ur: 6.9 mg/dL
Protein/Creat Ratio: 113 mg/g creat (ref 0–200)

## 2019-08-23 LAB — PROTIME-INR
INR: 0.9 (ref 0.9–1.2)
Prothrombin Time: 10 s (ref 9.1–12.0)

## 2019-08-23 LAB — APTT: aPTT: 26 s (ref 24–33)

## 2019-08-23 LAB — FIBRINOGEN: Fibrinogen: 417 mg/dL (ref 193–507)

## 2019-08-31 NOTE — BH Specialist Note (Addendum)
Integrated Behavioral Health via Telemedicine Video Visit  08/31/2019 Michelle Velez [id number]  Number of [place] visits: 3 Session Start time: 10:59  Session End time: 11:23 Total time: 24  Referring Provider: Loma Boston, DO Type of Visit: Video Michelle/Family location: [facility] Provider location: [facility] [place] [facility]  All persons participating in visit: Michelle Velez and [place]    Confirmed Michelle's address: Yes  Confirmed Michelle's phone number: Yes  Any changes to demographics: No   Confirmed Michelle's insurance: Yes  Any changes to Michelle's insurance: No   Discussed confidentiality: At previous visit  I connected with Lajuana Ripple  by a video enabled telemedicine application and verified that I am speaking with the correct person using two identifiers.     I discussed the limitations of evaluation and management by telemedicine and the availability of in person appointments.  I discussed that the purpose of this visit is to provide behavioral health care while limiting exposure to the novel coronavirus.   Discussed there is a possibility of technology failure and discussed alternative modes of communication if that failure occurs.  I discussed that engaging in this video visit, they consent to the provision of behavioral healthcare and the services will be billed under their insurance.  Michelle and/or legal guardian expressed understanding and consented to video visit: Yes   PRESENTING CONCERNS: Michelle and/or family reports the following symptoms/concerns: Pt states her primary concern today is worry over possibility of having cesarean, along with continued leg pain at night, and inadequate support postpartum.  Pt requests script for breast pump. Duration of problem: Current pregnancy; Severity of problem: moderate  STRENGTHS (Protective Factors/Coping Skills): Supportive family, uses  daily self-coping strategies  GOALS ADDRESSED: Michelle will: 1.  Reduce symptoms of: anxiety and stress  2.  Increase knowledge and/or ability of: stress reduction  3.  Demonstrate ability to: Increase healthy adjustment to current life circumstances  INTERVENTIONS: Interventions utilized:  Solution-Focused Strategies Standardized Assessments completed: PHQ9/GAD7 given in past two weeks  ASSESSMENT: Michelle currently experiencing Anxiety disorder .   Michelle may benefit from continued psychoeducation and brief therapeutic interventions regarding coping with symptoms of anxiety  .  PLAN: 1. Follow up with behavioral health clinician on : One month; call Roselyn Reef prior to this time, as needed, at [phone] 2. Behavioral recommendations:  -Continue using self-coping strategies that have helped with anxiety in the past (crossword puzzles, relaxation breathing, worry time strategy) -Continue taking prenatal vitamin daily -Consider viewing Virtual tour of [facility] at [email or web address] -Accept referral to [facility] (make sure voicemail is set up and not full, to receive call to set up [facility]) -Contact Department of Social Services to apply for EBT (food stamps) as soon as able  -Discuss at medical visit with provider:     1-need for script for breast pump,     2-preference for VBAC(vaginal birth after cesarean)    3-continued pain in legs at night 3. Referral(s): [place] (In Clinic)  I discussed the assessment and treatment plan with the Michelle and/or parent/guardian. They were provided an opportunity to ask questions and all were answered. They agreed with the plan and demonstrated an understanding of the instructions.   They were advised to call back or seek an in-person evaluation if the symptoms worsen or if the condition fails to improve as anticipated.  Caroleen Hamman [facility]  Depression screen [facility] 2/9 08/22/2019 08/08/2019 08/07/2019 07/07/2019  06/08/2019  Decreased  Interest 1 1 1 1  0  Down, Depressed, Hopeless 1 1 1 1  0  PHQ - 2 Score 2 2 2 2  0  Altered sleeping 1 1 1 3 1   Tired, decreased energy 0 1 1 3  0  Change in appetite 1 1 3 1  0  Feeling bad or failure about yourself  0 1 1 0 0  Trouble concentrating 0 0 0 0 0  Moving slowly or fidgety/restless 0 0 0 0 0  Suicidal thoughts 0 0 0 0 0  PHQ-9 Score 4 6 8 9 1    GAD 7 : Generalized Anxiety Score 08/22/2019 08/08/2019 08/07/2019 07/07/2019  Nervous, Anxious, on Edge 1 1 1 2   Control/stop worrying 1 1 1 3   Worry too much - different things 1 1 1 3   Trouble relaxing 0 1 1 3   Restless 0 0 0 2  Easily annoyed or irritable 1 1 1 3   Afraid - awful might happen 1 1 1 3   Total GAD 7 Score 5 6 6  19

## 2019-09-04 ENCOUNTER — Other Ambulatory Visit: Payer: Self-pay

## 2019-09-04 ENCOUNTER — Ambulatory Visit (INDEPENDENT_AMBULATORY_CARE_PROVIDER_SITE_OTHER): Payer: 59 | Admitting: Clinical

## 2019-09-04 DIAGNOSIS — F419 Anxiety disorder, unspecified: Secondary | ICD-10-CM

## 2019-09-06 ENCOUNTER — Telehealth (INDEPENDENT_AMBULATORY_CARE_PROVIDER_SITE_OTHER): Payer: Self-pay | Admitting: Family Medicine

## 2019-09-06 VITALS — BP 125/67 | HR 78

## 2019-09-06 DIAGNOSIS — Z3480 Encounter for supervision of other normal pregnancy, unspecified trimester: Secondary | ICD-10-CM

## 2019-09-06 DIAGNOSIS — O99113 Other diseases of the blood and blood-forming organs and certain disorders involving the immune mechanism complicating pregnancy, third trimester: Secondary | ICD-10-CM

## 2019-09-06 DIAGNOSIS — O34219 Maternal care for unspecified type scar from previous cesarean delivery: Secondary | ICD-10-CM

## 2019-09-06 DIAGNOSIS — M79604 Pain in right leg: Secondary | ICD-10-CM

## 2019-09-06 DIAGNOSIS — Z3A31 31 weeks gestation of pregnancy: Secondary | ICD-10-CM

## 2019-09-06 DIAGNOSIS — D696 Thrombocytopenia, unspecified: Secondary | ICD-10-CM

## 2019-09-06 DIAGNOSIS — M79605 Pain in left leg: Secondary | ICD-10-CM

## 2019-09-06 DIAGNOSIS — O26891 Other specified pregnancy related conditions, first trimester: Secondary | ICD-10-CM

## 2019-09-06 DIAGNOSIS — Z98891 History of uterine scar from previous surgery: Secondary | ICD-10-CM

## 2019-09-06 NOTE — Progress Notes (Signed)
I connected with  [name] on 09/06/19 at 1419 by telephone and verified that I am speaking with the correct person using two identifiers.   I discussed the limitations, risks, security and privacy concerns of performing an evaluation and management service by telephone and the availability of in person appointments. I also discussed with the patient that there may be a patient responsible charge related to this service. The patient expressed understanding and agreed to proceed.  Marjo Bicker, RN 09/06/2019  2:19 PM

## 2019-09-06 NOTE — Patient Instructions (Signed)
 Contraception Choices Contraception, also called birth control, refers to methods or devices that prevent pregnancy. Hormonal methods Contraceptive implant  A contraceptive implant is a thin, plastic tube that contains a hormone. It is inserted into the upper part of the arm. It can remain in place for up to 3 years. Progestin-only injections Progestin-only injections are injections of progestin, a synthetic form of the hormone progesterone. They are given every 3 months by a health care provider. Birth control pills  Birth control pills are pills that contain hormones that prevent pregnancy. They must be taken once a day, preferably at the same time each day. Birth control patch  The birth control patch contains hormones that prevent pregnancy. It is placed on the skin and must be changed once a week for three weeks and removed on the fourth week. A prescription is needed to use this method of contraception. Vaginal ring  A vaginal ring contains hormones that prevent pregnancy. It is placed in the vagina for three weeks and removed on the fourth week. After that, the process is repeated with a new ring. A prescription is needed to use this method of contraception. Emergency contraceptive Emergency contraceptives prevent pregnancy after unprotected sex. They come in pill form and can be taken up to 5 days after sex. They work best the sooner they are taken after having sex. Most emergency contraceptives are available without a prescription. This method should not be used as your only form of birth control. Barrier methods Female condom  A female condom is a thin sheath that is worn over the penis during sex. Condoms keep sperm from going inside a woman's body. They can be used with a spermicide to increase their effectiveness. They should be disposed after a single use. Female condom  A female condom is a soft, loose-fitting sheath that is put into the vagina before sex. The condom keeps  sperm from going inside a woman's body. They should be disposed after a single use. Diaphragm  A diaphragm is a soft, dome-shaped barrier. It is inserted into the vagina before sex, along with a spermicide. The diaphragm blocks sperm from entering the uterus, and the spermicide kills sperm. A diaphragm should be left in the vagina for 6-8 hours after sex and removed within 24 hours. A diaphragm is prescribed and fitted by a health care provider. A diaphragm should be replaced every 1-2 years, after giving birth, after gaining more than 15 lb (6.8 kg), and after pelvic surgery. Cervical cap  A cervical cap is a round, soft latex or plastic cup that fits over the cervix. It is inserted into the vagina before sex, along with spermicide. It blocks sperm from entering the uterus. The cap should be left in place for 6-8 hours after sex and removed within 48 hours. A cervical cap must be prescribed and fitted by a health care provider. It should be replaced every 2 years. Sponge  A sponge is a soft, circular piece of polyurethane foam with spermicide on it. The sponge helps block sperm from entering the uterus, and the spermicide kills sperm. To use it, you make it wet and then insert it into the vagina. It should be inserted before sex, left in for at least 6 hours after sex, and removed and thrown away within 30 hours. Spermicides Spermicides are chemicals that kill or block sperm from entering the cervix and uterus. They can come as a cream, jelly, suppository, foam, or tablet. A spermicide should be inserted into   the vagina with an applicator at least 10-15 minutes before sex to allow time for it to work. The process must be repeated every time you have sex. Spermicides do not require a prescription. Intrauterine contraception Intrauterine device (IUD) An IUD is a T-shaped device that is put in a woman's uterus. There are two types:  Hormone IUD.This type contains progestin, a synthetic form of the  hormone progesterone. This type can stay in place for 3-5 years.  Copper IUD.This type is wrapped in copper wire. It can stay in place for 10 years.  Permanent methods of contraception Female tubal ligation In this method, a woman's fallopian tubes are sealed, tied, or blocked during surgery to prevent eggs from traveling to the uterus. Hysteroscopic sterilization In this method, a small, flexible insert is placed into each fallopian tube. The inserts cause scar tissue to form in the fallopian tubes and block them, so sperm cannot reach an egg. The procedure takes about 3 months to be effective. Another form of birth control must be used during those 3 months. Female sterilization This is a procedure to tie off the tubes that carry sperm (vasectomy). After the procedure, the man can still ejaculate fluid (semen). Natural planning methods Natural family planning In this method, a couple does not have sex on days when the woman could become pregnant. Calendar method This means keeping track of the length of each menstrual cycle, identifying the days when pregnancy can happen, and not having sex on those days. Ovulation method In this method, a couple avoids sex during ovulation. Symptothermal method This method involves not having sex during ovulation. The woman typically checks for ovulation by watching changes in her temperature and in the consistency of cervical mucus. Post-ovulation method In this method, a couple waits to have sex until after ovulation. Summary  Contraception, also called birth control, means methods or devices that prevent pregnancy.  Hormonal methods of contraception include implants, injections, pills, patches, vaginal rings, and emergency contraceptives.  Barrier methods of contraception can include female condoms, female condoms, diaphragms, cervical caps, sponges, and spermicides.  There are two types of IUDs (intrauterine devices). An IUD can be put in a woman's  uterus to prevent pregnancy for 3-5 years.  Permanent sterilization can be done through a procedure for males, females, or both.  Natural family planning methods involve not having sex on days when the woman could become pregnant. This information is not intended to replace advice given to you by your health care provider. Make sure you discuss any questions you have with your health care provider. Document Revised: 04/01/2017 Document Reviewed: 05/02/2016 Elsevier Patient Education  2020 [employer] Inc.   Breastfeeding  Choosing to breastfeed is one of the best decisions you can make for yourself and your baby. A change in hormones during pregnancy causes your breasts to make breast milk in your milk-producing glands. Hormones prevent breast milk from being released before your baby is born. They also prompt milk flow after birth. Once breastfeeding has begun, thoughts of your baby, as well as his or her sucking or crying, can stimulate the release of milk from your milk-producing glands. Benefits of breastfeeding Research shows that breastfeeding offers many health benefits for infants and mothers. It also offers a cost-free and convenient way to feed your baby. For your baby  Your first milk (colostrum) helps your baby's digestive system to function better.  Special cells in your milk (antibodies) help your baby to fight off infections.  Breastfed babies are   less likely to develop asthma, allergies, obesity, or type 2 diabetes. They are also at lower risk for sudden infant death syndrome (SIDS).  Nutrients in breast milk are better able to meet your baby's needs compared to infant formula.  Breast milk improves your baby's brain development. For you  Breastfeeding helps to create a very special bond between you and your baby.  Breastfeeding is convenient. Breast milk costs nothing and is always available at the correct temperature.  Breastfeeding helps to burn calories. It helps you  to lose the weight that you gained during pregnancy.  Breastfeeding makes your uterus return faster to its size before pregnancy. It also slows bleeding (lochia) after you give birth.  Breastfeeding helps to lower your risk of developing type 2 diabetes, osteoporosis, rheumatoid arthritis, cardiovascular disease, and breast, ovarian, uterine, and endometrial cancer later in life. Breastfeeding basics Starting breastfeeding  Find a comfortable place to sit or lie down, with your neck and back well-supported.  Place a pillow or a rolled-up blanket under your baby to bring him or her to the level of your breast (if you are seated). Nursing pillows are specially designed to help support your arms and your baby while you breastfeed.  Make sure that your baby's tummy (abdomen) is facing your abdomen.  Gently massage your breast. With your fingertips, massage from the outer edges of your breast inward toward the nipple. This encourages milk flow. If your milk flows slowly, you may need to continue this action during the feeding.  Support your breast with 4 fingers underneath and your thumb above your nipple (make the letter "C" with your hand). Make sure your fingers are well away from your nipple and your baby's mouth.  Stroke your baby's lips gently with your finger or nipple.  When your baby's mouth is open wide enough, quickly bring your baby to your breast, placing your entire nipple and as much of the areola as possible into your baby's mouth. The areola is the colored area around your nipple. ? More areola should be visible above your baby's upper lip than below the lower lip. ? Your baby's lips should be opened and extended outward (flanged) to ensure an adequate, comfortable latch. ? Your baby's tongue should be between his or her lower gum and your breast.  Make sure that your baby's mouth is correctly positioned around your nipple (latched). Your baby's lips should create a seal on your  breast and be turned out (everted).  It is common for your baby to suck about 2-3 minutes in order to start the flow of breast milk. Latching Teaching your baby how to latch onto your breast properly is very important. An improper latch can cause nipple pain, decreased milk supply, and poor weight gain in your baby. Also, if your baby is not latched onto your nipple properly, he or she may swallow some air during feeding. This can make your baby fussy. Burping your baby when you switch breasts during the feeding can help to get rid of the air. However, teaching your baby to latch on properly is still the best way to prevent fussiness from swallowing air while breastfeeding. Signs that your baby has successfully latched onto your nipple  Silent tugging or silent sucking, without causing you pain. Infant's lips should be extended outward (flanged).  Swallowing heard between every 3-4 sucks once your milk has started to flow (after your let-down milk reflex occurs).  Muscle movement above and in front of his or her   ears while sucking. Signs that your baby has not successfully latched onto your nipple  Sucking sounds or smacking sounds from your baby while breastfeeding.  Nipple pain. If you think your baby has not latched on correctly, slip your finger into the corner of your baby's mouth to break the suction and place it between your baby's gums. Attempt to start breastfeeding again. Signs of successful breastfeeding Signs from your baby  Your baby will gradually decrease the number of sucks or will completely stop sucking.  Your baby will fall asleep.  Your baby's body will relax.  Your baby will retain a small amount of milk in his or her mouth.  Your baby will let go of your breast by himself or herself. Signs from you  Breasts that have increased in firmness, weight, and size 1-3 hours after feeding.  Breasts that are softer immediately after breastfeeding.  Increased milk  volume, as well as a change in milk consistency and color by the fifth day of breastfeeding.  Nipples that are not sore, cracked, or bleeding. Signs that your baby is getting enough milk  Wetting at least 1-2 diapers during the first 24 hours after birth.  Wetting at least 5-6 diapers every 24 hours for the first week after birth. The urine should be clear or pale yellow by the age of 5 days.  Wetting 6-8 diapers every 24 hours as your baby continues to grow and develop.  At least 3 stools in a 24-hour period by the age of 5 days. The stool should be soft and yellow.  At least 3 stools in a 24-hour period by the age of 7 days. The stool should be seedy and yellow.  No loss of weight greater than 10% of birth weight during the first 3 days of life.  Average weight gain of 4-7 oz (113-198 g) per week after the age of 4 days.  Consistent daily weight gain by the age of 5 days, without weight loss after the age of 2 weeks. After a feeding, your baby may spit up a small amount of milk. This is normal. Breastfeeding frequency and duration Frequent feeding will help you make more milk and can prevent sore nipples and extremely full breasts (breast engorgement). Breastfeed when you feel the need to reduce the fullness of your breasts or when your baby shows signs of hunger. This is called "breastfeeding on demand." Signs that your baby is hungry include:  Increased alertness, activity, or restlessness.  Movement of the head from side to side.  Opening of the mouth when the corner of the mouth or cheek is stroked (rooting).  Increased sucking sounds, smacking lips, cooing, sighing, or squeaking.  Hand-to-mouth movements and sucking on fingers or hands.  Fussing or crying. Avoid introducing a pacifier to your baby in the first 4-6 weeks after your baby is born. After this time, you may choose to use a pacifier. Research has shown that pacifier use during the first year of a baby's life  decreases the risk of sudden infant death syndrome (SIDS). Allow your baby to feed on each breast as long as he or she wants. When your baby unlatches or falls asleep while feeding from the first breast, offer the second breast. Because newborns are often sleepy in the first few weeks of life, you may need to awaken your baby to get him or her to feed. Breastfeeding times will vary from baby to baby. However, the following rules can serve as a guide to   help you make sure that your baby is properly fed:  Newborns (babies 4 weeks of age or younger) may breastfeed every 1-3 hours.  Newborns should not go without breastfeeding for longer than 3 hours during the day or 5 hours during the night.  You should breastfeed your baby a minimum of 8 times in a 24-hour period. Breast milk pumping     Pumping and storing breast milk allows you to make sure that your baby is exclusively fed your breast milk, even at times when you are unable to breastfeed. This is especially important if you go back to work while you are still breastfeeding, or if you are not able to be present during feedings. Your lactation consultant can help you find a method of pumping that works best for you and give you guidelines about how long it is safe to store breast milk. Caring for your breasts while you breastfeed Nipples can become dry, cracked, and sore while breastfeeding. The following recommendations can help keep your breasts moisturized and healthy:  Avoid using soap on your nipples.  Wear a supportive bra designed especially for nursing. Avoid wearing underwire-style bras or extremely tight bras (sports bras).  Air-dry your nipples for 3-4 minutes after each feeding.  Use only cotton bra pads to absorb leaked breast milk. Leaking of breast milk between feedings is normal.  Use lanolin on your nipples after breastfeeding. Lanolin helps to maintain your skin's normal moisture barrier. Pure lanolin is not harmful (not  toxic) to your baby. You may also hand express a few drops of breast milk and gently massage that milk into your nipples and allow the milk to air-dry. In the first few weeks after giving birth, some women experience breast engorgement. Engorgement can make your breasts feel heavy, warm, and tender to the touch. Engorgement peaks within 3-5 days after you give birth. The following recommendations can help to ease engorgement:  Completely empty your breasts while breastfeeding or pumping. You may want to start by applying warm, moist heat (in the shower or with warm, water-soaked hand towels) just before feeding or pumping. This increases circulation and helps the milk flow. If your baby does not completely empty your breasts while breastfeeding, pump any extra milk after he or she is finished.  Apply ice packs to your breasts immediately after breastfeeding or pumping, unless this is too uncomfortable for you. To do this: ? Put ice in a plastic bag. ? Place a towel between your skin and the bag. ? Leave the ice on for 20 minutes, 2-3 times a day.  Make sure that your baby is latched on and positioned properly while breastfeeding. If engorgement persists after 48 hours of following these recommendations, contact your health care provider or a lactation consultant. Overall health care recommendations while breastfeeding  Eat 3 healthy meals and 3 snacks every day. Well-nourished mothers who are breastfeeding need an additional 450-500 calories a day. You can meet this requirement by increasing the amount of a balanced diet that you eat.  Drink enough water to keep your urine pale yellow or clear.  Rest often, relax, and continue to take your prenatal vitamins to prevent fatigue, stress, and low vitamin and mineral levels in your body (nutrient deficiencies).  Do not use any products that contain nicotine or tobacco, such as cigarettes and e-cigarettes. Your baby may be harmed by chemicals from  cigarettes that pass into breast milk and exposure to secondhand smoke. If you need help quitting, ask your   health care provider.  Avoid alcohol.  Do not use illegal drugs or marijuana.  Talk with your health care provider before taking any medicines. These include over-the-counter and prescription medicines as well as vitamins and herbal supplements. Some medicines that may be harmful to your baby can pass through breast milk.  It is possible to become pregnant while breastfeeding. If birth control is desired, ask your health care provider about options that will be safe while breastfeeding your baby. Where to find more information: [employer]: www.llli.org Contact a health care provider if:  You feel like you want to stop breastfeeding or have become frustrated with breastfeeding.  Your nipples are cracked or bleeding.  Your breasts are red, tender, or warm.  You have: ? Painful breasts or nipples. ? A swollen area on either breast. ? A fever or chills. ? Nausea or vomiting. ? Drainage other than breast milk from your nipples.  Your breasts do not become full before feedings by the fifth day after you give birth.  You feel sad and depressed.  Your baby is: ? Too sleepy to eat well. ? Having trouble sleeping. ? More than 1 week old and wetting fewer than 6 diapers in a 24-hour period. ? Not gaining weight by 5 days of age.  Your baby has fewer than 3 stools in a 24-hour period.  Your baby's skin or the white parts of his or her eyes become yellow. Get help right away if:  Your baby is overly tired (lethargic) and does not want to wake up and feed.  Your baby develops an unexplained fever. Summary  Breastfeeding offers many health benefits for infant and mothers.  Try to breastfeed your infant when he or she shows early signs of hunger.  Gently tickle or stroke your baby's lips with your finger or nipple to allow the baby to open his or her mouth.  Bring the baby to your breast. Make sure that much of the areola is in your baby's mouth. Offer one side and burp the baby before you offer the other side.  Talk with your health care provider or lactation consultant if you have questions or you face problems as you breastfeed. This information is not intended to replace advice given to you by your health care provider. Make sure you discuss any questions you have with your health care provider. Document Revised: 06/24/2017 Document Reviewed: 05/01/2016 Elsevier Patient Education  2020 Elsevier Inc.  

## 2019-09-06 NOTE — Progress Notes (Signed)
Patient ID: Michelle Velez, female   DOB: 24-Aug-1990, 29 y.o.   MRN: [id number]  I connected with@ on 09/07/19 at  2:15 PM EDT by: MyChart video and verified that I am speaking with the correct person using two identifiers.  Patient is located at home and provider is located at [facility].     The purpose of this virtual visit is to provide medical care while limiting exposure to the novel coronavirus. I discussed the limitations, risks, security and privacy concerns of performing an evaluation and management service by MyChart video and the availability of in person appointments. I also discussed with the patient that there may be a patient responsible charge related to this service. By engaging in this virtual visit, you consent to the provision of healthcare.  Additionally, you authorize for your insurance to be billed for the services provided during this visit.  The patient expressed understanding and agreed to proceed.  The following staff members participated in the virtual visit:  Venora Maples MD/MPH    PRENATAL VISIT NOTE  Subjective:  Michelle Velez is a 29 y.o. G3P1011 at [redacted]w[redacted]d  for phone visit for ongoing prenatal care.  She is currently monitored for the following issues for this high-risk pregnancy and has Supervision of other normal pregnancy, antepartum; History of cesarean delivery; and Gestational thrombocytopenia without hemorrhage in third trimester (HCC) on their problem list.  Patient reports bilateral leg pain at night.  Contractions: Not present. Vag. Bleeding: None.  Movement: Present. Denies leaking of fluid.   The following portions of the patient's history were reviewed and updated as appropriate: allergies, current medications, past family history, past medical history, past social history, past surgical history and problem list.   Objective:   Vitals:   09/06/19 1427  BP: 125/67  Pulse: 78   Self-Obtained  Fetal Status:     Movement: Present     Assessment and Plan:   Pregnancy: G3P1011 at [redacted]w[redacted]d 1. Supervision of other normal pregnancy, antepartum Stable BP/FHT normal  2. History of cesarean delivery VBAC consent signed last visit Per patient done at [facility] [place], records located in Care Everywhere Op note reviewed, LTCS with no complications  3. Gestational thrombocytopenia without hemorrhage in third trimester [facility]) Likely gestational though review of Care Everywhere records shows has intermittently had during non-pregnant times as well Most recent checks 129>115>91>133 Nadir of 74 during admission for last delivery Recheck at 36-37 wks, consider heme consult if again dips below 100k  4. Bilateral leg pain Patient reporting bilateral night time leg pain Denies any swelling Mother has had blood clots but she is not sure of the circumstances This issue has been ongoing for many months, reports she has mentioned it at prior OB visit in the past Reassured that likely secondary to varicosities/progression of pregnancy, but counseled on warning signs of DVT Patient to trial compression stockings to see if this improves symptoms  Preterm labor symptoms and general obstetric precautions including but not limited to vaginal bleeding, contractions, leaking of fluid and fetal movement were reviewed in detail with the patient.  Return in 2 weeks (on 09/20/2019) for [facility], in person.  Future Appointments  Date Time Provider Department Center  09/26/2019  2:15 PM Currie Paris, NP [facility]  10/02/2019  1:15 PM [facility]     Time spent on virtual visit: 12 minutes  Venora Maples, MD

## 2019-09-19 NOTE — BH Specialist Note (Signed)
Integrated Behavioral Health via Telemedicine Video Visit  09/19/2019 [name] [id number]  Number of [place] visits: 4 Session Start time: 1:15  Session End time: 1:49 Total time: 34  Referring Provider: Loma Boston, DO Type of Visit: Video Patient/Family location: Home [facility] Provider location: [facility] [place] [facility]  All persons participating in visit: Patient Michelle Velez and Michelle Velez    Confirmed patient's address: Yes  Confirmed patient's phone number: Yes  Any changes to demographics: No   Confirmed patient's insurance: Yes  Any changes to patient's insurance: No   Discussed confidentiality: at previous visit  I connected with Michelle Velez by a video enabled telemedicine application (Her[employer]) and verified that I am speaking with the correct person using two identifiers.     I discussed the limitations of evaluation and management by telemedicine and the availability of in person appointments.  I discussed that the purpose of this visit is to provide behavioral health care while limiting exposure to the novel coronavirus.   Discussed there is a possibility of technology failure and discussed alternative modes of communication if that failure occurs.  I discussed that engaging in this video visit, they consent to the provision of behavioral healthcare and the services will be billed under their insurance.  Patient and/or legal guardian expressed understanding and consented to video visit: Yes   PRESENTING CONCERNS: Patient and/or family reports the following symptoms/concerns: Pt states her primary concern today is life stress, attributed to inconsistent custody arrangement; helps to talk through feelings today.  Duration of problem: Current pregnancy; Severity of problem: moderate  STRENGTHS (Protective Factors/Coping Skills): Supportive family, implements daily self-coping strategies  GOALS  ADDRESSED: Patient will: 1.  Reduce symptoms of: anxiety and stress  2.  Increase knowledge and/or ability of: stress reduction  3.  Demonstrate ability to: Increase healthy adjustment to current life circumstances and Increase adequate support systems for patient/family  INTERVENTIONS: Interventions utilized:  Supportive Counseling and Link to [employer] Standardized Assessments completed: Not given today  ASSESSMENT: Patient currently experiencing Anxiety disorder, unspecified .   Patient may benefit from continued psychoeducation and brief therapeutic interventions regarding coping with symptoms of anxiety related to current life stressors .  PLAN: 1. Follow up with behavioral health clinician on : Six weeks for postpartum mood check; call Roselyn Reef at [phone] if needed earlier than scheduled visit 2. Behavioral recommendations:  -Prioritize healthy self-care and "nesting", as discussed, for the remainder of pregnancy -Continue using daily self-coping strategies (crossword puzzles, relaxation breathing, worry time strategy, etc.) -Continue taking prenatal vitamin daily [facility] at [email or web address] -Read through Postpartum Planner (on After Visit Summary)  3. Referral(s): Integrated Orthoptist (In Clinic) and Community Resources:  PP Planner  I discussed the assessment and treatment plan with the patient and/or parent/guardian. They were provided an opportunity to ask questions and all were answered. They agreed with the plan and demonstrated an understanding of the instructions.   They were advised to call back or seek an in-person evaluation if the symptoms worsen or if the condition fails to improve as anticipated.  Caroleen Hamman [facility]  Depression screen [facility] 2/9 09/06/2019 08/22/2019 08/08/2019 08/07/2019 07/07/2019  Decreased Interest 0 1 1 1 1   Down, Depressed, Hopeless 0 1 1 1 1   PHQ - 2 Score 0 2 2 2 2   Altered  sleeping 3 1 1 1 3   Tired, decreased energy 0 0 1 1 3  Change in appetite 0 1 1 3 1   Feeling bad or failure about yourself  0 0 1 1 0  Trouble concentrating 0 0 0 0 0  Moving slowly or fidgety/restless 0 0 0 0 0  Suicidal thoughts 0 0 0 0 0  PHQ-9 Score 3 4 6 8 9    GAD 7 : Generalized Anxiety Score 09/06/2019 08/22/2019 08/08/2019 08/07/2019  Nervous, Anxious, on Edge 1 1 1 1   Control/stop worrying 1 1 1 1   Worry too much - different things 1 1 1 1   Trouble relaxing 0 0 1 1  Restless 0 0 0 0  Easily annoyed or irritable 1 1 1 1   Afraid - awful might happen 0 1 1 1   Total GAD 7 Score 4 5 6  6

## 2019-09-24 ENCOUNTER — Other Ambulatory Visit: Payer: Self-pay

## 2019-09-24 ENCOUNTER — Inpatient Hospital Stay (HOSPITAL_COMMUNITY)
Admission: AD | Admit: 2019-09-24 | Discharge: 2019-09-24 | Disposition: A | Discharge status: Home / Self Care | Payer: 59 | Attending: Family Medicine | Admitting: Family Medicine

## 2019-09-24 ENCOUNTER — Encounter (HOSPITAL_COMMUNITY): Payer: Self-pay | Admitting: Family Medicine

## 2019-09-24 DIAGNOSIS — Z3689 Encounter for other specified antenatal screening: Secondary | ICD-10-CM

## 2019-09-24 DIAGNOSIS — R102 Pelvic and perineal pain: Secondary | ICD-10-CM

## 2019-09-24 DIAGNOSIS — R109 Unspecified abdominal pain: Secondary | ICD-10-CM | POA: Diagnosis not present

## 2019-09-24 DIAGNOSIS — M419 Scoliosis, unspecified: Secondary | ICD-10-CM | POA: Insufficient documentation

## 2019-09-24 DIAGNOSIS — O26893 Other specified pregnancy related conditions, third trimester: Secondary | ICD-10-CM | POA: Insufficient documentation

## 2019-09-24 DIAGNOSIS — Z3A34 34 weeks gestation of pregnancy: Secondary | ICD-10-CM | POA: Diagnosis not present

## 2019-09-24 LAB — URINALYSIS, ROUTINE W REFLEX MICROSCOPIC
Bilirubin Urine: NEGATIVE
Glucose, UA: NEGATIVE mg/dL
Hgb urine dipstick: NEGATIVE
Ketones, ur: NEGATIVE mg/dL
Leukocytes,Ua: NEGATIVE
Nitrite: NEGATIVE
Protein, ur: NEGATIVE mg/dL
Specific Gravity, Urine: 1.009 (ref 1.005–1.030)
pH: 7 (ref 5.0–8.0)

## 2019-09-24 LAB — WET PREP, GENITAL
Clue Cells Wet Prep HPF POC: NONE SEEN
Sperm: NONE SEEN
Trich, Wet Prep: NONE SEEN
Yeast Wet Prep HPF POC: NONE SEEN

## 2019-09-24 MED ORDER — CYCLOBENZAPRINE HCL 10 MG PO TABS: 10.0000 mg | ORAL_TABLET | Freq: Two times a day (BID) | ORAL | 0 refills | Status: DC | PRN

## 2019-09-24 MED ORDER — ACETAMINOPHEN 500 MG PO TABS
1000.0000 mg | ORAL_TABLET | Freq: Once | ORAL | Status: AC
  Administered 2019-09-24: 1000 mg via ORAL
  Filled 2019-09-24: qty 2

## 2019-09-24 NOTE — MAU Note (Signed)
.   Michelle Velez is a 29 y.o. at [redacted]w[redacted]d here in MAU reporting: lower abdominal cramping on and off since yesterday. Reports an increase in thick white vaginal discharge.   Onset of complaint: ongoing since yesterday Pain score: 7 Vitals:   09/24/19 0837  BP: 118/70  Pulse: 73  Resp: 16  Temp: 97.8 F (36.6 C)     FHT:135 Lab orders placed from triage: UA

## 2019-09-24 NOTE — Discharge Instructions (Signed)

## 2019-09-24 NOTE — MAU Provider Note (Signed)
History     CSN: [id number]  Arrival date and time: 09/24/19 4854   First Provider Initiated Contact with Patient 09/24/19 0844      Chief Complaint  Patient presents with  . Abdominal Pain   HPI Michelle Velez is a 29 y.o. G3P1011 at 103w0d who presents to MAU with chief complaint of abdominal pressure. This is a new problem, onset last night 09/23/2019 around 1700 hours. Patient states she did a lot of walking and shopping yesterday, and her discomfort gradually worsened throughout the evening. She was unable to sleep last night. She denies aggravating or alleviating factors. She has not taken medication or tried other treatments for this complaint. She denies vaginal bleeding, leaking of fluid, decreased fetal movement, fever, falls, or recent illness.  She is remote from sexual intercourse.  Patient states she was previously prescribed a maternity belt but is not sure where to obtain one.  She receives prenatal care with [facility] and her next appointment is 09/26/2019.   OB History    Gravida  3   Para  1   Term  1   Preterm      AB  1   Living  1     SAB  1   TAB      Ectopic      Multiple      Live Births  1           Past Medical History:  Diagnosis Date  . Anemia   . Inguinal hernia 2019  . Scoliosis 2006    Past Surgical History:  Procedure Laterality Date  . CESAREAN SECTION      History reviewed. No pertinent family history.  Social History   Tobacco Use  . Smoking status: Never Smoker  . Smokeless tobacco: Never Used  Vaping Use  . Vaping Use: Never used  Substance Use Topics  . Alcohol use: Never  . Drug use: Never    Allergies: No Known Allergies  Medications Prior to Admission  Medication Sig Dispense Refill Last Dose  . Prenatal Vit-Fe Fumarate-FA (MULTIVITAMIN-PRENATAL) 27-0.8 MG TABS tablet Take 1 tablet by mouth daily at 12 noon.   09/24/2019 at Unknown time  . Elastic Bandages & Supports (COMFORT FIT  MATERNITY SUPP SM) MISC 1 Units by Does not apply route daily as needed. (Patient not taking: Reported on 09/06/2019) 1 each 0   . ferrous sulfate 325 (65 FE) MG tablet Take 325 mg by mouth every other day.       Review of Systems  Gastrointestinal: Positive for abdominal pain.  Genitourinary: Negative for dysuria, flank pain and vaginal bleeding.  All other systems reviewed and are negative.  Physical Exam   Blood pressure 118/70, pulse 73, temperature 97.8 F (36.6 C), resp. rate 16, height 5\' 2"  (1.575 m), weight 60.8 kg, last menstrual period 01/29/2019.  Physical Exam  Nursing note and vitals reviewed. Respiratory: Effort normal and breath sounds normal.  GI:  Gravid  Genitourinary:    Genitourinary Comments: Pelvic exam: External genitalia normal, vaginal walls pink and well rugated, cervix visually closed, no lesions noted. Thick white discharge noted throughout vault. Closed cervix confirmed with digital exam.     Neurological: She is alert.  Psychiatric: Mood and thought content normal.    MAU Course/MDM  Procedures  --OB history term primary cesarean. Indication: failure to progress s/p PDIOL at [redacted] weeks GA --Cervix closed on initial exam --IV fluid bolus declined by patient during initial  discussion --Patient drove herself to MAU and is unable to coordinate alternate transportation. Not eligible for Flexeril  0948 --Return to bedside for updated pain assessment. Patient now reporting pain score 4/10 --Patient declines IV fluid bolus  1020 --RN at bedside, patient sleeping  1130 --CNM at bedside, cervix remains closed/thick.posterior  Reactive tracing: baseline 120, mod variability, positive accels, no decels Toco: Irregular contractions q 9-10 min, resolving with PO hydration  Patient Vitals for the past 24 hrs:  BP Temp Pulse Resp SpO2 Height Weight  09/24/19 1122 117/75 -- 64 -- -- -- --  09/24/19 0910 -- -- -- -- 97 % -- --  09/24/19 0909 -- -- -- -- 97  % -- --  09/24/19 0837 118/70 97.8 F (36.6 C) 73 16 -- 5\' 2"  (1.575 m) 60.8 kg   Results for orders placed or performed during the hospital encounter of 09/24/19 (from the past 24 hour(s))  Urinalysis, Routine w reflex microscopic     Status: None   Collection Time: 09/24/19  8:17 AM  Result Value Ref Range   Color, Urine YELLOW YELLOW   APPearance CLEAR CLEAR   Specific Gravity, Urine 1.009 1.005 - 1.030   pH 7.0 5.0 - 8.0   Glucose, UA NEGATIVE NEGATIVE mg/dL   Hgb urine dipstick NEGATIVE NEGATIVE   Bilirubin Urine NEGATIVE NEGATIVE   Ketones, ur NEGATIVE NEGATIVE mg/dL   Protein, ur NEGATIVE NEGATIVE mg/dL   Nitrite NEGATIVE NEGATIVE   Leukocytes,Ua NEGATIVE NEGATIVE  Wet prep, genital     Status: Abnormal   Collection Time: 09/24/19  8:55 AM  Result Value Ref Range   Yeast Wet Prep HPF POC NONE SEEN NONE SEEN   Trich, Wet Prep NONE SEEN NONE SEEN   Clue Cells Wet Prep HPF POC NONE SEEN NONE SEEN   WBC, Wet Prep HPF POC MANY (A) NONE SEEN   Sperm NONE SEEN    Meds ordered this encounter  Medications  . acetaminophen (TYLENOL) tablet 1,000 mg  . cyclobenzaprine (FLEXERIL) 10 MG tablet    Sig: Take 1 tablet (10 mg total) by mouth 2 (two) times daily as needed for muscle spasms.    Dispense:  20 tablet    Refill:  0    Order Specific Question:   Supervising Provider    Answer:   09/26/19 [[id number]]   Assessment and Plan  --29 y.o. G3P1011 at [redacted]w[redacted]d  --Reactive tracing --Cervix remains closed s/p 3 hours continuous monitoring --Outpatient rx Flexeril --Given contact information for rx maternity belt as well as retail options --Discharge home in stable condition  F/U: --[facility]. Next appt is 09/26/2019  09/28/2019 , CNM 09/24/2019, 12:30 PM

## 2019-09-26 ENCOUNTER — Other Ambulatory Visit: Payer: Self-pay

## 2019-09-26 ENCOUNTER — Ambulatory Visit (INDEPENDENT_AMBULATORY_CARE_PROVIDER_SITE_OTHER): Payer: 59 | Admitting: Nurse Practitioner

## 2019-09-26 ENCOUNTER — Encounter: Payer: Self-pay | Admitting: Nurse Practitioner

## 2019-09-26 VITALS — BP 112/76 | HR 68 | Wt 141.3 lb

## 2019-09-26 DIAGNOSIS — Z3A34 34 weeks gestation of pregnancy: Secondary | ICD-10-CM

## 2019-09-26 DIAGNOSIS — R21 Rash and other nonspecific skin eruption: Secondary | ICD-10-CM

## 2019-09-26 DIAGNOSIS — R102 Pelvic and perineal pain: Secondary | ICD-10-CM

## 2019-09-26 DIAGNOSIS — Z3480 Encounter for supervision of other normal pregnancy, unspecified trimester: Secondary | ICD-10-CM

## 2019-09-26 DIAGNOSIS — O26899 Other specified pregnancy related conditions, unspecified trimester: Secondary | ICD-10-CM

## 2019-09-26 DIAGNOSIS — O26893 Other specified pregnancy related conditions, third trimester: Secondary | ICD-10-CM

## 2019-09-26 MED ORDER — BREAST PUMP MISC: 0 refills | Status: DC

## 2019-09-26 MED ORDER — NYSTATIN-TRIAMCINOLONE 100000-0.1 UNIT/GM-% EX OINT: 1.0000 "application " | TOPICAL_OINTMENT | Freq: Two times a day (BID) | CUTANEOUS | 0 refills | Status: DC

## 2019-09-26 NOTE — Progress Notes (Signed)
    Subjective:  Michelle Velez is a 29 y.o. G3P1011 at [redacted]w[redacted]d being seen today for ongoing prenatal care.  She is currently monitored for the following issues for this low-risk pregnancy and has Supervision of other normal pregnancy, antepartum; History of cesarean delivery; and Gestational thrombocytopenia without hemorrhage in third trimester (HCC) on their problem list.  Patient reports pressure in lower midline.  Contractions: Irritability. Vag. Bleeding: None.  Movement: Present. Denies leaking of fluid.  Seen in MAU this week - note reviewed.  The following portions of the patient's history were reviewed and updated as appropriate: allergies, current medications, past family history, past medical history, past social history, past surgical history and problem list. Problem list updated.  Objective:   Vitals:   09/26/19 1447  BP: 112/76  Pulse: 68  Weight: 141 lb 4.8 oz (64.1 kg)    Fetal Status: Fetal Heart Rate (bpm): 141 Fundal Height: 33 cm Movement: Present     General:  Alert, oriented and cooperative. Patient is in no acute distress.  Skin: Skin is warm and dry. No rash noted.   Cardiovascular: Normal heart rate noted  Respiratory: Normal respiratory effort, no problems with respiration noted  Abdomen: Soft, gravid, appropriate for gestational age. Pain/Pressure: Present     Pelvic:  Cervical exam deferred        Extremities: Normal range of motion.  Edema: None  Mental Status: Normal mood and affect. Normal behavior. Normal judgment and thought content.   Urinalysis:      Assessment and Plan:  Pregnancy: G3P1011 at [redacted]w[redacted]d  1. Supervision of other normal pregnancy, antepartum Doing well.  No back pain.  Still has increased pressure which may be normal in this second pregnancy.  Has not gotten flexeril filled.  2. Pelvic pressure in pregnancy, antepartum   3.  Rash Spotty hyperpigmented, slightly dry rash between breasts and across upper shoulders.  Prescribed mild  steriod cream.  Expect the rash to go away after the baby is born.  Preterm labor symptoms and general obstetric precautions including but not limited to vaginal bleeding, contractions, leaking of fluid and fetal movement were reviewed in detail with the patient. Please refer to After Visit Summary for other counseling recommendations.  Return in 2 weeks (on 10/10/2019) for in person ROB for vaginal swabs.  Nolene Bernheim, RN, MSN, NP-BC Nurse Practitioner, [facility] for [employer], [facility] 09/26/2019 5:05 PM

## 2019-09-27 ENCOUNTER — Encounter: Payer: Self-pay | Admitting: Family Medicine

## 2019-10-02 ENCOUNTER — Other Ambulatory Visit: Payer: Self-pay

## 2019-10-02 ENCOUNTER — Ambulatory Visit (INDEPENDENT_AMBULATORY_CARE_PROVIDER_SITE_OTHER): Payer: 59 | Admitting: Clinical

## 2019-10-02 DIAGNOSIS — O99340 Other mental disorders complicating pregnancy, unspecified trimester: Secondary | ICD-10-CM | POA: Diagnosis not present

## 2019-10-02 DIAGNOSIS — F419 Anxiety disorder, unspecified: Secondary | ICD-10-CM | POA: Diagnosis not present

## 2019-10-02 NOTE — Patient Instructions (Addendum)
[facility] [address] [place], [place] [address] [phone] (main office) [phone] (Cristofer Yaffe's office)     BRAINSTORMING  Develop a Plan Goals: . Provide a way to start conversation about your new life with a baby . Assist parents in recognizing and using resources within their reach . Help pave the way before birth for an easier period of transition afterwards.  Make a list of the following information to keep in a central location: . Full name of Mom and Partner: _____________________________________________ . Baby's full name and Date of Birth: ___________________________________________ . Home Address: ___________________________________________________________ ________________________________________________________________________ . Home Phone: ____________________________________________________________ . Parents' cell numbers: _____________________________________________________ ________________________________________________________________________ . Name and contact info for OB: ______________________________________________ . Name and contact info for Pediatrician:________________________________________ . Contact info for Lactation Consultants: ________________________________________  REST and SLEEP *You each need at least 4-5 hours of uninterrupted sleep every day. Write specific names and contact information.* . How are you going to rest in the postpartum period? While partner's home? When partner returns to work? When you both return to work? . Where will your baby sleep? . Who is available to help during the day? Evening? Night? . Who could move in for a period to help support you? . What are some ideas to help you get enough  sleep? __________________________________________________________________________________________________________________________________________________________________________________________________________________________________________ NUTRITIOUS FOOD AND DRINK *Plan for meals before your baby is born so you can have healthy food to eat during the immediate postpartum period.* . Who will look after breakfast? Lunch? Dinner? List names and contact information. Brainstorm quick, healthy ideas for each meal. . What can you do before baby is born to prepare meals for the postpartum period? . How can others help you with meals? . Which grocery stores provide online shopping and delivery? . Which restaurants offer take-out or delivery options? ______________________________________________________________________________________________________________________________________________________________________________________________________________________________________________________________________________________________________________________________________________________________________________________________________  CARE FOR MOM *It's important that mom is cared for and pampered in the postpartum period. Remember, the most important ways new mothers need care are: sleep, nutrition, gentle exercise, and time off.* . Who can come take care of mom during this period? Make a list of people with their contact information. . List some activities that make you feel cared for, rested, and energized? Who can make sure you have opportunities to do these things? . Does mom have a space of her very own within your home that's just for her? Make a "Mama Cave" where she can be comfortable, rest, and renew herself  daily. ______________________________________________________________________________________________________________________________________________________________________________________________________________________________________________________________________________________________________________________________________________________________________________________________________    CARE FOR AND FEEDING BABY *Knowledgeable and encouraging people will offer the best support with regard to feeding your baby.* . Educate yourself and choose the best feeding option for your baby. . Make a list of people who will guide, support, and be a resource for you as your care for and feed your baby. (Friends that have breastfed or are currently breastfeeding, lactation consultants, breastfeeding support groups, etc.) . Consider a postpartum doula. (These websites can give you information: dona.org & padanc.org) . Seek out local breastfeeding resources like the breastfeeding support group at [facility] or [employer]. ______________________________________________________________________________________________________________________________________________________________________________________________________________________________________________________________________________________________________________________________________________________________________________________________________  CHORES AND ERRANDS . Who can help with a thorough cleaning before baby is born? . Make a list of people who will help with housekeeping and chores, like laundry, light cleaning, dishes, bathrooms, etc. . Who can run some errands for you? . What can you do to make sure you are stocked with basic supplies before baby is born? . Who is going to do the    shopping? ______________________________________________________________________________________________________________________________________________________________________________________________________________________________________________________________________________________________________________________________________________________________________________________________________     Family Adjustment *Nurture yourselves.it helps parents be more loving and allows for better bonding with their child.* . What sorts of things do you and partner enjoy doing together? Which activities help you to connect and strengthen your relationship? Make a list of those things. Make a list of people whom you trust to care for your baby so you can have some time together as a couple. . What types of things help partner feel connected to Mom? Make a list. . What needs will partner have in order to bond with baby? . Other children? Who will care for them when you go into labor and while you are in the hospital? . Think about what the needs of your older children might be. Who can help you meet those needs? In what ways are you helping them prepare for bringing baby home? List some specific strategies you have for family adjustment. _______________________________________________________________________________________________________________________________________________________________________________________________________________________________________________________________________________________________________________________________________________  SUPPORT *Someone who can empathize with experiences normalizes your problems and makes them more bearable.* . Make a list of other friends, neighbors, and/or co-workers you know with infants (and small children, if applicable) with whom you can connect. . Make a list of local or online support groups, mom groups, etc. in which you can be  involved. ______________________________________________________________________________________________________________________________________________________________________________________________________________________________________________________________________________________________________________________________________________________________________________________________________  Childcare Plans . Investigate and plan for childcare if mom is returning to work. . Talk about mom's concerns about her transition back to work. . Talk about partner's concerns regarding this transition.  Mental Health *Your mental health is one of the highest priorities for a pregnant or postpartum mom.* . 1 in 5 women experience anxiety and/or depression from the time of conception through the first year after birth. . Postpartum Mood Disorders are the #1 complication of pregnancy and childbirth and the suffering experienced by these mothers is not necessary! These illnesses are temporary and respond well to treatment, which often includes self-care, social support, talk therapy, and medication when needed. . Women experiencing anxiety and depression often say things like: "I'm supposed to be happy.why do I feel so sad?", "Why can't I snap out of it?", "I'm having thoughts that scare me." . There is no need to be embarrassed if you are feeling these symptoms: o Overwhelmed, anxious, angry, sad, guilty, irritable, hopeless, exhausted but can't sleep o You are NOT alone. You are NOT to blame. With help, you WILL be well. . Where can I find help? Medical professionals such as your OB, midwife, gynecologist, family practitioner, primary care provider, pediatrician, or mental health providers; [facility] support groups: Feelings After Birth, Breastfeeding Support Group, Baby and Me Group, and Fit 4 Two exercise classes. . You have permission to ask for help. It will confirm your feelings, validate your  experiences, share/learn coping strategies, and gain support and encouragement as you heal. You are important! BRAINSTORM . Make a list of local resources, including resources for mom and for partner. . Identify support groups. . Identify people to call late at night - include names and contact info. . Talk with partner about perinatal mood and anxiety disorders. . Talk with your OB, midwife, and doula about baby blues and about perinatal mood and anxiety disorders. . Talk with your pediatrician about perinatal mood and anxiety disorders.   Support & Sanity Savers   What do you really need?  . Basics . In preparing for a new baby, many expectant parents spend hours shopping for baby clothes, decorating the nursery, and deciding which car seat to   buy. Yet most don't think much about what the reality of parenting a newborn will be like, and what they need to make it through that. So, here is the advice of experienced parents. We know you'll read this, and think "they're exaggerating, I don't really need that." Just trust us on these, OK? Plan for all of this, and if it turns out you don't need it, come back and teach us how you did it!  . Must-Haves (Once baby's survival needs are met, make sure you attend to your own survival needs!) . Sleep . An average newborn sleeps 16-18 hours per day, over 6-7 sleep periods, rarely more than three hours at a time. It is normal and healthy for a newborn to wake throughout the night... but really hard on parents!! . Naps. Prioritize sleep above any responsibilities like: cleaning house, visiting friends, running errands, etc.  Sleep whenever baby sleeps. If you can't nap, at least have restful times when baby eats. The more rest you get, the more patient you will be, the more emotionally stable, and better at solving problems.  . Food . You may not have realized it would be difficult to eat when you have a newborn. Yet, when we talk to . countless new  parents, they say things like "it may be 2:00 pm when I realize I haven't had breakfast yet." Or "every time we sit down to dinner, baby needs to eat, and my food gets cold, so I don't bother to eat it." . Finger food. Before your baby is born, stock up with one months' worth of food that: 1) you can eat with one hand while holding a baby, 2) doesn't need to be prepped, 3) is good hot or cold, 4) doesn't spoil when left out for a few hours, and 5) you like to eat. Think about: nuts, dried fruit, Clif bars, pretzels, jerky, gogurt, baby carrots, apples, bananas, crackers, cheez-n-crackers, string cheese, hot pockets or frozen burritos to microwave, garden burgers and breakfast pastries to put in the toaster, yogurt drinks, etc. . Restaurant Menus. Make lists of your favorite restaurants & menu items. When family/friends want to help, you can give specific information without much thought. They can either bring you the food or send gift cards for just the right meals. . Freezer Meals.  Take some time to make a few meals to put in the freezer ahead of time.  Easy to freeze meals can be anything such as soup, lasagna, chicken pie, or spaghetti sauce. . Set up a Meal Schedule.  Ask friends and family to sign up to bring you meals during the first few weeks of being home. (It can be passed around at baby showers!) You have no idea how helpful this will be until you are in the throes of parenting.  www.takethemameal.com is a great website to check out. . Emotional Support . Know who to call when you're stressed out. Parenting a newborn is very challenging work. There are times when it totally overwhelms your normal coping abilities. EVERY NEW PARENT NEEDS TO HAVE A PLAN FOR WHO TO CALL WHEN THEY JUST CAN'T COPE ANY MORE. (And it has to be someone other than the baby's other parent!) Before your baby is born, come up with at least one person you can call for support - write their phone number down and post it on the  refrigerator. . Anxiety & Sadness. Baby blues are normal after pregnancy; however, there are more severe types of anxiety &   sadness which can occur and should not be ignored.  They are always treatable, but you have to take the first step by reaching out for help. [facility] offers a "Mom Talk" group which meets every Tuesday from 10 am - 11 am.  This group is for new moms who need support and connection after their babies are born.  Call [phone].  . Really, Really Helpful (Plan for them! Make sure these happen often!!) . Physical Support with Taking Care of Yourselves . Asking friends and family. Before your baby is born, set up a schedule of people who can come and visit and help out (or ask a friend to schedule for you). Any time someone says "let me know what I can do to help," sign them up for a day. When they get there, their job is not to take care of the baby (that's your job and your joy). Their job is to take care of you!  . Postpartum doulas. If you don't have anyone you can call on for support, look into postpartum doulas:  professionals at helping parents with caring for baby, caring for themselves, getting breastfeeding started, and helping with household tasks. www.padanc.org is a helpful website for learning about doulas in our area. . Peer Support / Parent Groups . Why: One of the greatest ideas for new parents is to be around other new parents. Parent groups give you a chance to share and listen to others who are going through the same season of life, get a sense of what is normal infant development by watching several babies learn and grow, share your stories of triumph and struggles with empathetic ears, and forgive your own mistakes when you realize all parents are learning by trial and error. . Where to find: There are many places you can meet other new parents throughout our community.  [facility] offers the following classes for new moms and their little ones:  Baby  and Me (Birth to Crawling) and Breastfeeding Support Group. Go to www.conehealthybaby.com or call [phone] for more information. . Time for your Relationship . It's easy to get so caught up in meeting baby's immediate needs that it's hard to find time to connect with your partner, and meet the needs of your relationship. It's also easy to forget what "quality time with your partner" actually looks like. If you take your baby on a date, you'd be amazed how much of your couple time is spent feeding the baby, diapering the baby, admiring the baby, and talking about the baby. . Dating: Try to take time for just the two of you. Babysitter tip: Sometimes when moms are breastfeeding a newborn, they find it hard to figure out how to schedule outings around baby's unpredictable feeding schedules. Have the babysitter come for a three hour period. When she comes over, if baby has just eaten, you can leave right away, and come back in two hours. If baby hasn't fed recently, you start the date at home. Once baby gets hungry and gets a good feeding in, you can head out for the rest of your date time. . Date Nights at Home: If you can't get out, at least set aside one evening a week to prioritize your relationship: whenever baby dozes off or doesn't have any immediate needs, spend a little time focusing on each other. . Potential conflicts: The main relationship conflicts that come up for new parents are: issues related to sexuality, financial stresses, a feeling of an unfair division   of household tasks, and conflicts in parenting styles. The more you can work on these issues before baby arrives, the better!  . Fun and Frills (Don't forget these. and don't feel guilty for indulging in them!) . Everyone has something in life that is a fun little treat that they do just for themselves. It may be: reading the morning paper, or going for a daily jog, or having coffee with a friend once a week, or going to a movie on Friday  nights, or fine chocolates, or bubble baths, or curling up with a good book. . Unless you do fun things for yourself every now and then, it's hard to have the energy for fun with your baby. Whatever your "special" treats are, make sure you find a way to continue to indulge in them after your baby is born. These special moments can recharge you, and allow you to return to baby with a new joy   PERINATAL MOOD DISORDERS: MATERNAL MENTAL HEALTH FROM CONCEPTION THROUGH THE POSTPARTUM PERIOD   Emergency and Crisis Resources:  If you are an imminent risk to self or others, are experiencing intense personal distress, and/or have noticed significant changes in activities of daily living, call:  . 911 . [facility]: [phone] . Mobile Crisis: 877-626-1772 . National Suicide Hotline: 1-800-273-8255 Or visit the following crisis centers: . Local Emergency Departments . [facility]: [address], [place] [phone]. Hours: 8:30AM-5PM. Insurance Accepted: Medicaid, Medicare, and Uninsured.  . [facility]  [address], [place] Mon-Friday 8am-3pm  [phone]                                                                                    Non-Crisis Resources: To identify specific providers that are covered by your insurance, contact your insurance company or local agencies: [facility]: 1-800-256-2452 [facility]: 888-581-9988 [employer]: 1-800-939-5911 Postpartum Support International- [place] 1-800-944-4773                                                      Outpatient therapy and medication management providers:  [facility] [phone] Hours: 9AM-5PM  Insurance Accepted: AARP, Aetna, BCBS, Cigna, [employer], Humana, Medicare  [facility] [phone] Hours: 8AM-5PM  nsurance Accepted: All insurances EXCEPT AARP, Aetna, [employer], and  [employer] [facility]: [phone]             Hours: 8AM-8PM Insurance Accepted: Aetna, BCBS, Cigna, [employer], Medicaid, Medicare, Uninsured [facility]: [phone] [facility]: [phone] Hours: 8:30AM-7PM Insurance Accepted: Aetna, BCBS, Medicaid, Medicare, Tricare, [employer] [facility]:  [phone]              Hours:9AM-5PM Insurance Accepted:  Aetna, BCBS, [facility], Medicaid, United Health Care  [facility] [phone] Hours: 9AM-5:30PM Insurance Accepted: AARP, Aetna, BCBS, Cigna, and Medicaid, Medicare, United Health Care [facility]:  [phone] Hours: 9am-5pm Insurance Accepted: BCBS; they do not accept Medicaid/Medicare   [facility]: [phone] Hours: 9am-9pm Insurance Accepted: All major insurance including Medicaid and Medicare [facility]: [phone] Hours: 9AM-5:30PM Insurance Accepted: All insurances EXCEPT Medicaid and Medicare. [facility]: [phone]                                                                       Parenting Support Groups [facility]: [phone] [facility]:  [phone] Family Support Network (support for children in the NICU and/or with special needs), [phone]                                                                   Mental Health Support Groups [employer]: 336-373-1402                                                                                     Online Resources: Postpartum Support International: http://www.postpartum.net/  800-944-4PPD 2Moms Supporting Moms:  www.momssupportingmoms.net     

## 2019-10-12 ENCOUNTER — Encounter: Payer: Self-pay | Admitting: Medical

## 2019-10-12 ENCOUNTER — Other Ambulatory Visit (HOSPITAL_COMMUNITY)
Admission: RE | Admit: 2019-10-12 | Discharge: 2019-10-12 | Disposition: A | Discharge status: Home / Self Care | Payer: PRIVATE HEALTH INSURANCE | Source: Ambulatory Visit | Attending: Medical | Admitting: Medical

## 2019-10-12 ENCOUNTER — Other Ambulatory Visit: Payer: Self-pay

## 2019-10-12 ENCOUNTER — Ambulatory Visit (INDEPENDENT_AMBULATORY_CARE_PROVIDER_SITE_OTHER): Payer: 59 | Admitting: Medical

## 2019-10-12 VITALS — BP 121/62 | HR 71 | Wt 144.2 lb

## 2019-10-12 DIAGNOSIS — Z3480 Encounter for supervision of other normal pregnancy, unspecified trimester: Secondary | ICD-10-CM

## 2019-10-12 DIAGNOSIS — D696 Thrombocytopenia, unspecified: Secondary | ICD-10-CM

## 2019-10-12 DIAGNOSIS — Z98891 History of uterine scar from previous surgery: Secondary | ICD-10-CM

## 2019-10-12 DIAGNOSIS — Z3A36 36 weeks gestation of pregnancy: Secondary | ICD-10-CM

## 2019-10-12 DIAGNOSIS — O99113 Other diseases of the blood and blood-forming organs and certain disorders involving the immune mechanism complicating pregnancy, third trimester: Secondary | ICD-10-CM

## 2019-10-12 DIAGNOSIS — Z3483 Encounter for supervision of other normal pregnancy, third trimester: Secondary | ICD-10-CM

## 2019-10-12 NOTE — Patient Instructions (Signed)
Fetal Movement Counts Patient Name: ________________________________________________ Patient Due Date: ____________________ What is a fetal movement count?  A fetal movement count is the number of times that you feel your baby move during a certain amount of time. This may also be called a fetal kick count. A fetal movement count is recommended for every pregnant woman. You may be asked to start counting fetal movements as early as week 28 of your pregnancy. Pay attention to when your baby is most active. You may notice your baby's sleep and wake cycles. You may also notice things that make your baby move more. You should do a fetal movement count:  When your baby is normally most active.  At the same time each day. A good time to count movements is while you are resting, after having something to eat and drink. How do I count fetal movements? 1. Find a quiet, comfortable area. Sit, or lie down on your side. 2. Write down the date, the start time and stop time, and the number of movements that you felt between those two times. Take this information with you to your health care visits. 3. Write down your start time when you feel the first movement. 4. Count kicks, flutters, swishes, rolls, and jabs. You should feel at least 10 movements. 5. You may stop counting after you have felt 10 movements, or if you have been counting for 2 hours. Write down the stop time. 6. If you do not feel 10 movements in 2 hours, contact your health care provider for further instructions. Your health care provider may want to do additional tests to assess your baby's well-being. Contact a health care provider if:  You feel fewer than 10 movements in 2 hours.  Your baby is not moving like he or she usually does. Date: ____________ Start time: ____________ Stop time: ____________ Movements: ____________ Date: ____________ Start time: ____________ Stop time: ____________ Movements: ____________ Date: ____________  Start time: ____________ Stop time: ____________ Movements: ____________ Date: ____________ Start time: ____________ Stop time: ____________ Movements: ____________ Date: ____________ Start time: ____________ Stop time: ____________ Movements: ____________ Date: ____________ Start time: ____________ Stop time: ____________ Movements: ____________ Date: ____________ Start time: ____________ Stop time: ____________ Movements: ____________ Date: ____________ Start time: ____________ Stop time: ____________ Movements: ____________ Date: ____________ Start time: ____________ Stop time: ____________ Movements: ____________ This information is not intended to replace advice given to you by your health care provider. Make sure you discuss any questions you have with your health care provider. Document Revised: 11/17/2018 Document Reviewed: 11/17/2018 Elsevier Patient Education  2020 Else[employer]. Braxton Hicks Contractions Contractions of the uterus can occur throughout pregnancy, but they are not always a sign that you are in labor. You may have practice contractions called Braxton Hicks contractions. These false labor contractions are sometimes confused with true labor. What are Braxton Hicks contractions? Braxton Hicks contractions are tightening movements that occur in the muscles of the uterus before labor. Unlike true labor contractions, these contractions do not result in opening (dilation) and thinning of the cervix. Toward the end of pregnancy (32-34 weeks), Braxton Hicks contractions can happen more often and may become stronger. These contractions are sometimes difficult to tell apart from true labor because they can be very uncomfortable. You should not feel embarrassed if you go to the hospital with false labor. Sometimes, the only way to tell if you are in true labor is for your health care provider to look for changes in the cervix. The health care provider   will do a physical exam and may  monitor your contractions. If you are not in true labor, the exam should show that your cervix is not dilating and your water has not broken. If there are no other health problems associated with your pregnancy, it is completely safe for you to be sent home with false labor. You may continue to have Braxton Hicks contractions until you go into true labor. How to tell the difference between true labor and false labor True labor  Contractions last 30-70 seconds.  Contractions become very regular.  Discomfort is usually felt in the top of the uterus, and it spreads to the lower abdomen and low back.  Contractions do not go away with walking.  Contractions usually become more intense and increase in frequency.  The cervix dilates and gets thinner. False labor  Contractions are usually shorter and not as strong as true labor contractions.  Contractions are usually irregular.  Contractions are often felt in the front of the lower abdomen and in the groin.  Contractions may go away when you walk around or change positions while lying down.  Contractions get weaker and are shorter-lasting as time goes on.  The cervix usually does not dilate or become thin. Follow these instructions at home:   Take over-the-counter and prescription medicines only as told by your health care provider.  Keep up with your usual exercises and follow other instructions from your health care provider.  Eat and drink lightly if you think you are going into labor.  If Braxton Hicks contractions are making you uncomfortable: ? Change your position from lying down or resting to walking, or change from walking to resting. ? Sit and rest in a tub of warm water. ? Drink enough fluid to keep your urine pale yellow. Dehydration may cause these contractions. ? Do slow and deep breathing several times an hour.  Keep all follow-up prenatal visits as told by your health care provider. This is important. Contact a  health care provider if:  You have a fever.  You have continuous pain in your abdomen. Get help right away if:  Your contractions become stronger, more regular, and closer together.  You have fluid leaking or gushing from your vagina.  You pass blood-tinged mucus (bloody show).  You have bleeding from your vagina.  You have low back pain that you never had before.  You feel your baby's head pushing down and causing pelvic pressure.  Your baby is not moving inside you as much as it used to. Summary  Contractions that occur before labor are called Braxton Hicks contractions, false labor, or practice contractions.  Braxton Hicks contractions are usually shorter, weaker, farther apart, and less regular than true labor contractions. True labor contractions usually become progressively stronger and regular, and they become more frequent.  Manage discomfort from Braxton Hicks contractions by changing position, resting in a warm bath, drinking plenty of water, or practicing deep breathing. This information is not intended to replace advice given to you by your health care provider. Make sure you discuss any questions you have with your health care provider. Document Revised: 03/12/2017 Document Reviewed: 08/13/2016 Elsevier Patient Education  2020 Elsevier Inc.  

## 2019-10-12 NOTE — Progress Notes (Signed)
   PRENATAL VISIT NOTE  Subjective:  Michelle Velez is a 29 y.o. G3P1011 at [redacted]w[redacted]d being seen today for ongoing prenatal care.  She is currently monitored for the following issues for this high-risk pregnancy and has Supervision of other normal pregnancy, antepartum; History of cesarean delivery; and Gestational thrombocytopenia without hemorrhage in third trimester (HCC) on their problem list.  Patient reports occasional contractions.  Contractions: Irritability. Vag. Bleeding: None.  Movement: Present. Denies leaking of fluid.   The following portions of the patient's history were reviewed and updated as appropriate: allergies, current medications, past family history, past medical history, past social history, past surgical history and problem list.   Objective:   Vitals:   10/12/19 1428  BP: 121/62  Pulse: 71  Weight: 144 lb 3.2 oz (65.4 kg)    Fetal Status: Fetal Heart Rate (bpm): 140 Fundal Height: 36 cm Movement: Present     General:  Alert, oriented and cooperative. Patient is in no acute distress.  Skin: Skin is warm and dry. No rash noted.   Cardiovascular: Normal heart rate noted  Respiratory: Normal respiratory effort, no problems with respiration noted  Abdomen: Soft, gravid, appropriate for gestational age.  Pain/Pressure: Present     Pelvic: Cervical exam performed in the presence of a chaperone Dilation: 1 Effacement (%): Thick Station: -3  Extremities: Normal range of motion.  Edema: None  Mental Status: Normal mood and affect. Normal behavior. Normal judgment and thought content.   Assessment and Plan:  Pregnancy: G3P1011 at [redacted]w[redacted]d 1. Supervision of other normal pregnancy, antepartum - GC/Chlamydia probe amp ([place])not at [facility] - Culture, beta strep (group b only) - Planning to use [facility] - Condoms only for MOC  2. Gestational thrombocytopenia without hemorrhage in third trimester (HCC) - CBC  3. History of cesarean delivery - Planning TOLAC, consent  signed previously   Preterm labor symptoms and general obstetric precautions including but not limited to vaginal bleeding, contractions, leaking of fluid and fetal movement were reviewed in detail with the patient. Please refer to After Visit Summary for other counseling recommendations.   Return in about 1 week (around 10/19/2019) for LOB, any provider, In-Person.  Future Appointments  Date Time Provider Department Center  10/19/2019  2:15 PM Kathlene Cote [facility]  10/26/2019  3:15 PM Rasch, Harolyn Rutherford, NP [facility]  11/02/2019  2:15 PM Kathlene Cote [facility]  11/20/2019 10:45 AM [facility]    Vonzella Nipple, [place]

## 2019-10-13 ENCOUNTER — Other Ambulatory Visit: Payer: Self-pay | Admitting: Medical

## 2019-10-13 ENCOUNTER — Telehealth: Payer: Self-pay | Admitting: Lactation Services

## 2019-10-13 ENCOUNTER — Telehealth: Payer: Self-pay | Admitting: Hematology

## 2019-10-13 DIAGNOSIS — D696 Thrombocytopenia, unspecified: Secondary | ICD-10-CM

## 2019-10-13 LAB — CBC
Hematocrit: 39.1 % (ref 34.0–46.6)
Hemoglobin: 13.4 g/dL (ref 11.1–15.9)
MCH: 31.3 pg (ref 26.6–33.0)
MCHC: 34.3 g/dL (ref 31.5–35.7)
MCV: 91 fL (ref 79–97)
Platelets: 70 10*3/uL — CL (ref 150–450)
RBC: 4.28 x10E6/uL (ref 3.77–5.28)
RDW: 12.1 % (ref 11.7–15.4)
WBC: 7.1 10*3/uL (ref 3.4–10.8)

## 2019-10-13 LAB — GC/CHLAMYDIA PROBE AMP (~~LOC~~) NOT AT ARMC
Chlamydia: NEGATIVE
Comment: NEGATIVE
Comment: NORMAL
Neisseria Gonorrhea: NEGATIVE

## 2019-10-13 NOTE — Telephone Encounter (Signed)
Received a new hem referral from [facility] for thrombocytopenia in pregnancy. Michelle Velez has been cld and scheduled to see Dr. Candise Che on 7/13 at 11am. Pt aware to arrive 15 minutes early.

## 2019-10-13 NOTE — Telephone Encounter (Signed)
Called [facility]. Left message for Sue Lush, referral coordinator to get patient scheduled ASAP for Thrombocytopenia.

## 2019-10-13 NOTE — Telephone Encounter (Signed)
-----   Message from Marny Lowenstein, PA-C sent at 10/13/2019  8:36 AM EDT ----- Patient needs referral to Hematology Oncology ASAP. Plt 70 yesterday (10/12/19) and she is already [redacted]w[redacted]d. Referral order placed. Needs to be seen as soon as possible.   Vonzella Nipple, PA-C 10/13/2019 8:36 AM

## 2019-10-16 LAB — CULTURE, BETA STREP (GROUP B ONLY): Strep Gp B Culture: NEGATIVE

## 2019-10-17 ENCOUNTER — Telehealth: Payer: Self-pay | Admitting: Lactation Services

## 2019-10-17 NOTE — Telephone Encounter (Signed)
-----   Message from Julie N Wenzel, PA-C sent at 10/13/2019  8:36 AM EDT ----- Patient needs referral to Hematology Oncology ASAP. Plt 70 yesterday (10/12/19) and she is already [redacted]w[redacted]d. Referral order placed. Needs to be seen as soon as possible.   Julie Wenzel, PA-C 10/13/2019 8:36 AM    

## 2019-10-17 NOTE — Telephone Encounter (Signed)
Patient being seen 7/13 at 11:00 am.

## 2019-10-19 ENCOUNTER — Other Ambulatory Visit: Payer: Self-pay

## 2019-10-19 ENCOUNTER — Ambulatory Visit (INDEPENDENT_AMBULATORY_CARE_PROVIDER_SITE_OTHER): Payer: 59 | Admitting: Medical

## 2019-10-19 VITALS — BP 125/66 | HR 66 | Wt 144.3 lb

## 2019-10-19 DIAGNOSIS — O368130 Decreased fetal movements, third trimester, not applicable or unspecified: Secondary | ICD-10-CM | POA: Diagnosis not present

## 2019-10-19 DIAGNOSIS — O0993 Supervision of high risk pregnancy, unspecified, third trimester: Secondary | ICD-10-CM

## 2019-10-19 DIAGNOSIS — Z3A37 37 weeks gestation of pregnancy: Secondary | ICD-10-CM

## 2019-10-19 DIAGNOSIS — Z98891 History of uterine scar from previous surgery: Secondary | ICD-10-CM

## 2019-10-19 DIAGNOSIS — O0990 Supervision of high risk pregnancy, unspecified, unspecified trimester: Secondary | ICD-10-CM

## 2019-10-19 DIAGNOSIS — D696 Thrombocytopenia, unspecified: Secondary | ICD-10-CM | POA: Diagnosis not present

## 2019-10-19 DIAGNOSIS — O99113 Other diseases of the blood and blood-forming organs and certain disorders involving the immune mechanism complicating pregnancy, third trimester: Secondary | ICD-10-CM

## 2019-10-19 NOTE — Patient Instructions (Addendum)
Fetal Movement Counts Patient Name: ________________________________________________ Patient Due Date: ____________________ What is a fetal movement count?  A fetal movement count is the number of times that you feel your baby move during a certain amount of time. This may also be called a fetal kick count. A fetal movement count is recommended for every pregnant woman. You may be asked to start counting fetal movements as early as week 28 of your pregnancy. Pay attention to when your baby is most active. You may notice your baby's sleep and wake cycles. You may also notice things that make your baby move more. You should do a fetal movement count:  When your baby is normally most active.  At the same time each day. A good time to count movements is while you are resting, after having something to eat and drink. How do I count fetal movements? 1. Find a quiet, comfortable area. Sit, or lie down on your side. 2. Write down the date, the start time and stop time, and the number of movements that you felt between those two times. Take this information with you to your health care visits. 3. Write down your start time when you feel the first movement. 4. Count kicks, flutters, swishes, rolls, and jabs. You should feel at least 10 movements. 5. You may stop counting after you have felt 10 movements, or if you have been counting for 2 hours. Write down the stop time. 6. If you do not feel 10 movements in 2 hours, contact your health care provider for further instructions. Your health care provider may want to do additional tests to assess your baby's well-being. Contact a health care provider if:  You feel fewer than 10 movements in 2 hours.  Your baby is not moving like he or she usually does. Date: ____________ Start time: ____________ Stop time: ____________ Movements: ____________ Date: ____________ Start time: ____________ Stop time: ____________ Movements: ____________ Date: ____________  Start time: ____________ Stop time: ____________ Movements: ____________ Date: ____________ Start time: ____________ Stop time: ____________ Movements: ____________ Date: ____________ Start time: ____________ Stop time: ____________ Movements: ____________ Date: ____________ Start time: ____________ Stop time: ____________ Movements: ____________ Date: ____________ Start time: ____________ Stop time: ____________ Movements: ____________ Date: ____________ Start time: ____________ Stop time: ____________ Movements: ____________ Date: ____________ Start time: ____________ Stop time: ____________ Movements: ____________ This information is not intended to replace advice given to you by your health care provider. Make sure you discuss any questions you have with your health care provider. Document Revised: 11/17/2018 Document Reviewed: 11/17/2018 Elsevier Patient Education  2020 Elsevier Inc. Braxton Hicks Contractions Contractions of the uterus can occur throughout pregnancy, but they are not always a sign that you are in labor. You may have practice contractions called Braxton Hicks contractions. These false labor contractions are sometimes confused with true labor. What are Braxton Hicks contractions? Braxton Hicks contractions are tightening movements that occur in the muscles of the uterus before labor. Unlike true labor contractions, these contractions do not result in opening (dilation) and thinning of the cervix. Toward the end of pregnancy (32-34 weeks), Braxton Hicks contractions can happen more often and may become stronger. These contractions are sometimes difficult to tell apart from true labor because they can be very uncomfortable. You should not feel embarrassed if you go to the hospital with false labor. Sometimes, the only way to tell if you are in true labor is for your health care provider to look for changes in the cervix. The health care provider   will do a physical exam and may  monitor your contractions. If you are not in true labor, the exam should show that your cervix is not dilating and your water has not broken. If there are no other health problems associated with your pregnancy, it is completely safe for you to be sent home with false labor. You may continue to have Braxton Hicks contractions until you go into true labor. How to tell the difference between true labor and false labor True labor  Contractions last 30-70 seconds.  Contractions become very regular.  Discomfort is usually felt in the top of the uterus, and it spreads to the lower abdomen and low back.  Contractions do not go away with walking.  Contractions usually become more intense and increase in frequency.  The cervix dilates and gets thinner. False labor  Contractions are usually shorter and not as strong as true labor contractions.  Contractions are usually irregular.  Contractions are often felt in the front of the lower abdomen and in the groin.  Contractions may go away when you walk around or change positions while lying down.  Contractions get weaker and are shorter-lasting as time goes on.  The cervix usually does not dilate or become thin. Follow these instructions at home:   Take over-the-counter and prescription medicines only as told by your health care provider.  Keep up with your usual exercises and follow other instructions from your health care provider.  Eat and drink lightly if you think you are going into labor.  If Braxton Hicks contractions are making you uncomfortable: ? Change your position from lying down or resting to walking, or change from walking to resting. ? Sit and rest in a tub of warm water. ? Drink enough fluid to keep your urine pale yellow. Dehydration may cause these contractions. ? Do slow and deep breathing several times an hour.  Keep all follow-up prenatal visits as told by your health care provider. This is important. Contact a  health care provider if:  You have a fever.  You have continuous pain in your abdomen. Get help right away if:  Your contractions become stronger, more regular, and closer together.  You have fluid leaking or gushing from your vagina.  You pass blood-tinged mucus (bloody show).  You have bleeding from your vagina.  You have low back pain that you never had before.  You feel your baby's head pushing down and causing pelvic pressure.  Your baby is not moving inside you as much as it used to. Summary  Contractions that occur before labor are called Braxton Hicks contractions, false labor, or practice contractions.  Braxton Hicks contractions are usually shorter, weaker, farther apart, and less regular than true labor contractions. True labor contractions usually become progressively stronger and regular, and they become more frequent.  Manage discomfort from Braxton Hicks contractions by changing position, resting in a warm bath, drinking plenty of water, or practicing deep breathing. This information is not intended to replace advice given to you by your health care provider. Make sure you discuss any questions you have with your health care provider. Document Revised: 03/12/2017 Document Reviewed: 08/13/2016 Elsevier Patient Education  2020 Elsevier Inc.  

## 2019-10-19 NOTE — Progress Notes (Signed)
   PRENATAL VISIT NOTE  Subjective:  Michelle Velez is a 29 y.o. G3P1011 at [redacted]w[redacted]d being seen today for ongoing prenatal care.  She is currently monitored for the following issues for this high-risk pregnancy and has Supervision of high risk pregnancy, antepartum; History of cesarean delivery; and Gestational thrombocytopenia without hemorrhage in third trimester (HCC) on their problem list.  Patient reports occasional contractions.  Contractions: Irritability. Vag. Bleeding: None.  Movement: (!) Decreased. Denies leaking of fluid.   The following portions of the patient's history were reviewed and updated as appropriate: allergies, current medications, past family history, past medical history, past social history, past surgical history and problem list.   Objective:   Vitals:   10/19/19 1414  BP: 125/66  Pulse: 66  Weight: 144 lb 4.8 oz (65.5 kg)    Fetal Status: Fetal Heart Rate (bpm): 135 Fundal Height: 38 cm Movement: (!) Decreased     General:  Alert, oriented and cooperative. Patient is in no acute distress.  Skin: Skin is warm and dry. No rash noted.   Cardiovascular: Normal heart rate noted  Respiratory: Normal respiratory effort, no problems with respiration noted  Abdomen: Soft, gravid, appropriate for gestational age.  Pain/Pressure: Present     Pelvic: Cervical exam deferred        Extremities: Normal range of motion.  Edema: None  Mental Status: Normal mood and affect. Normal behavior. Normal judgment and thought content.   Assessment and Plan:  Pregnancy: G3P1011 at [redacted]w[redacted]d 1. Supervision of high risk pregnancy, antepartum - Doing well - Decreased fetal movement - NST today  - Negative GBS and GC/Chlamydia from last visit discussed with patient   2. History of cesarean delivery - Planning TOLAC, consent signed previously   3. Gestational thrombocytopenia without hemorrhage in third trimester [facility]) - Referred to Hematology Oncology after last visit, appointment made  for 10/24/19 due to worsening thrombocytopenia    Fetal Monitoring: Baseline: 130 bpm Variability: moderate Accelerations: 15 x 15 Decelerations: none Contractions: UI  Term labor symptoms and general obstetric precautions including but not limited to vaginal bleeding, contractions, leaking of fluid and fetal movement were reviewed in detail with the patient. Please refer to After Visit Summary for other counseling recommendations.   Return in about 1 week (around 10/26/2019) for In-Person, [facility] MD only.  Future Appointments  Date Time Provider Department Center  10/24/2019 11:00 AM Johney Maine, MD [facility] None  10/24/2019 11:45 AM [facility] ONLY [facility] None  10/26/2019  3:15 PM Rasch, Harolyn Rutherford, NP [facility]  11/02/2019  2:15 PM Kathlene Cote [facility]  11/20/2019 10:45 AM [facility]    Vonzella Nipple, PA-C

## 2019-10-24 ENCOUNTER — Other Ambulatory Visit: Payer: Self-pay

## 2019-10-24 ENCOUNTER — Inpatient Hospital Stay: Payer: 59

## 2019-10-24 ENCOUNTER — Inpatient Hospital Stay: Payer: 59 | Attending: Hematology | Admitting: Hematology

## 2019-10-24 VITALS — BP 127/76 | HR 78 | Temp 97.9°F | Resp 16 | Ht 62.0 in | Wt 145.9 lb

## 2019-10-24 DIAGNOSIS — D696 Thrombocytopenia, unspecified: Secondary | ICD-10-CM

## 2019-10-24 DIAGNOSIS — D509 Iron deficiency anemia, unspecified: Secondary | ICD-10-CM | POA: Diagnosis not present

## 2019-10-24 DIAGNOSIS — O99113 Other diseases of the blood and blood-forming organs and certain disorders involving the immune mechanism complicating pregnancy, third trimester: Secondary | ICD-10-CM | POA: Diagnosis present

## 2019-10-24 DIAGNOSIS — D6959 Other secondary thrombocytopenia: Secondary | ICD-10-CM | POA: Diagnosis not present

## 2019-10-24 DIAGNOSIS — M419 Scoliosis, unspecified: Secondary | ICD-10-CM | POA: Insufficient documentation

## 2019-10-24 DIAGNOSIS — Z3A38 38 weeks gestation of pregnancy: Secondary | ICD-10-CM | POA: Insufficient documentation

## 2019-10-24 DIAGNOSIS — K4090 Unilateral inguinal hernia, without obstruction or gangrene, not specified as recurrent: Secondary | ICD-10-CM | POA: Diagnosis not present

## 2019-10-24 DIAGNOSIS — Z832 Family history of diseases of the blood and blood-forming organs and certain disorders involving the immune mechanism: Secondary | ICD-10-CM | POA: Diagnosis not present

## 2019-10-24 LAB — CMP (CANCER CENTER ONLY)
ALT: 16 U/L (ref 0–44)
AST: 17 U/L (ref 15–41)
Albumin: 3 g/dL — ABNORMAL LOW (ref 3.5–5.0)
Alkaline Phosphatase: 107 U/L (ref 38–126)
Anion gap: 9 (ref 5–15)
BUN: 5 mg/dL — ABNORMAL LOW (ref 6–20)
CO2: 21 mmol/L — ABNORMAL LOW (ref 22–32)
Calcium: 8.8 mg/dL — ABNORMAL LOW (ref 8.9–10.3)
Chloride: 107 mmol/L (ref 98–111)
Creatinine: 0.58 mg/dL (ref 0.44–1.00)
GFR, Est AFR Am: >60 mL/min (ref >60)
GFR, Estimated: >60 mL/min (ref >60)
Glucose, Bld: 86 mg/dL (ref 70–99)
Potassium: 3.9 mmol/L (ref 3.5–5.1)
Sodium: 137 mmol/L (ref 135–145)
Total Bilirubin: 0.3 mg/dL (ref 0.3–1.2)
Total Protein: 6.5 g/dL (ref 6.5–8.1)

## 2019-10-24 LAB — CBC WITH DIFFERENTIAL/PLATELET
Abs Immature Granulocytes: 0.05 10*3/uL (ref 0.00–0.07)
Basophils Absolute: 0 10*3/uL (ref 0.0–0.1)
Basophils Relative: 0 %
Eosinophils Absolute: 0.1 10*3/uL (ref 0.0–0.5)
Eosinophils Relative: 1 %
HCT: 40.5 % (ref 36.0–46.0)
Hemoglobin: 13.6 g/dL (ref 12.0–15.0)
Immature Granulocytes: 1 %
Lymphocytes Relative: 12 %
Lymphs Abs: 0.9 10*3/uL (ref 0.7–4.0)
MCH: 31 pg (ref 26.0–34.0)
MCHC: 33.6 g/dL (ref 30.0–36.0)
MCV: 92.3 fL (ref 80.0–100.0)
Monocytes Absolute: 0.9 10*3/uL (ref 0.1–1.0)
Monocytes Relative: 12 %
Neutro Abs: 5.3 10*3/uL (ref 1.7–7.7)
Neutrophils Relative %: 74 %
Platelets: 78 10*3/uL — ABNORMAL LOW (ref 150–400)
RBC: 4.39 MIL/uL (ref 3.87–5.11)
RDW: 12.4 % (ref 11.5–15.5)
WBC: 7.2 10*3/uL (ref 4.0–10.5)
nRBC: 0 % (ref 0.0–0.2)

## 2019-10-24 LAB — IMMATURE PLATELET FRACTION: Immature Platelet Fraction: 20.2 % — ABNORMAL HIGH (ref 1.2–8.6)

## 2019-10-24 LAB — VITAMIN B12: Vitamin B-12: 643 pg/mL (ref 180–914)

## 2019-10-24 LAB — IRON AND TIBC
Iron: 153 ug/dL — ABNORMAL HIGH (ref 41–142)
Saturation Ratios: 31 % (ref 21–57)
TIBC: 499 ug/dL — ABNORMAL HIGH (ref 236–444)
UIBC: 346 ug/dL (ref 120–384)

## 2019-10-24 LAB — FERRITIN: Ferritin: 25 ng/mL (ref 11–307)

## 2019-10-24 LAB — PLATELET BY CITRATE

## 2019-10-24 NOTE — Progress Notes (Signed)
HEMATOLOGY/ONCOLOGY CONSULTATION NOTE  Date of Service: 10/24/2019  Patient Care Team: Patient, No Pcp Per as PCP - General (General Practice)  CHIEF COMPLAINTS/PURPOSE OF CONSULTATION:  Thrombocytopenia in pregnancy  HISTORY OF PRESENTING ILLNESS:   Michelle Velez is a wonderful 29 y.o. female who has been referred to [place] by Center for [facility] for evaluation and management of thrombocytopenia in pregnancy. The pt reports that she is doing well overall.   The pt reports that this is her second pregnancy. Her first pregnancy was four years ago and her platelets became low during the latter part of this pregnancy. At their lowest they were 86K. Pt did not require any treatments or interventions during her first pregnancy for her low platelets, but was able to have an Epidural and C-section. Pt has had no thrombocytopenia outside of pregnancy. She has a history of iron deficiency anemia, but has had no other vitamin deficiencies. She denies heavy menstrual cycles between pregnancies. Pt is currently taking pre-natal vitamins daily and is trying to take PO Ferrous Sulfate regularly. She does not have any signs of pre-eclampsia or proteinuria with her current pregnancy. She has had some gum bleeding while brushing her teeth, but denies any other abnormal or excessive bleeding.   Pt has mild scoliosis that has never required surgery. Her mother has a history of blood clots. No known hereditary blood clotting or blood disorders have been found. She has an inguinal hernia on the right side and surgery has been suggested, but has declined at this time.  Most recent lab results (10/12/2019) of CBC is as follows: all values are WNL except for PLT at 70K.  On review of systems, pt reports gum bleeds and denies nose bleeds, bruising, abdominal pain, leg swelling and any other symptoms.   On PMHx the pt reports C-section, Iron deficiency Anemia, Thrombocytopenia, Scoliosis, Inguinal hernia.  On  Social Hx the pt reports that she is a non-smoker and does not drink alcohol.  On Family Hx the pt reports her mother has a significant history of blood clots.   MEDICAL HISTORY:  Past Medical History:  Diagnosis Date  . Anemia   . Inguinal hernia 2019  . Scoliosis 2006    SURGICAL HISTORY: Past Surgical History:  Procedure Laterality Date  . CESAREAN SECTION      SOCIAL HISTORY: Social History   Socioeconomic History  . Marital status: Single    Spouse name: Not on file  . Number of children: Not on file  . Years of education: Not on file  . Highest education level: Not on file  Occupational History  . Not on file  Tobacco Use  . Smoking status: Never Smoker  . Smokeless tobacco: Never Used  Vaping Use  . Vaping Use: Never used  Substance and Sexual Activity  . Alcohol use: Never  . Drug use: Never  . Sexual activity: Yes    Birth control/protection: None  Other Topics Concern  . Not on file  Social History Narrative  . Not on file   Social Determinants of Health   Financial Resource Strain:   . Difficulty of Paying Living Expenses:   Food Insecurity: No Food Insecurity  . Worried About Programme researcher, broadcasting/film/video in the Last Year: Never true  . Ran Out of Food in the Last Year: Never true  Transportation Needs: No Transportation Needs  . Lack of Transportation (Medical): No  . Lack of Transportation (Non-Medical): No  Physical Activity:   .  Days of Exercise per Week:   . Minutes of Exercise per Session:   Stress:   . Feeling of Stress :   Social Connections:   . Frequency of Communication with Friends and Family:   . Frequency of Social Gatherings with Friends and Family:   . Attends Religious Services:   . Active Member of Clubs or Organizations:   . Attends Banker Meetings:   Marland Kitchen Marital Status:   Intimate Partner Violence:   . Fear of Current or Ex-Partner:   . Emotionally Abused:   Marland Kitchen Physically Abused:   . Sexually Abused:     FAMILY  HISTORY: No family history on file.  ALLERGIES:  has No Known Allergies.  MEDICATIONS:  Current Outpatient Medications  Medication Sig Dispense Refill  . Prenatal Vit-Fe Fumarate-FA (MULTIVITAMIN-PRENATAL) 27-0.8 MG TABS tablet Take 1 tablet by mouth daily at 12 noon.    . cyclobenzaprine (FLEXERIL) 10 MG tablet Take 1 tablet (10 mg total) by mouth 2 (two) times daily as needed for muscle spasms. (Patient not taking: Reported on 10/12/2019) 20 tablet 0  . Elastic Bandages & Supports (COMFORT FIT MATERNITY SUPP SM) MISC 1 Units by Does not apply route daily as needed. (Patient not taking: Reported on 09/06/2019) 1 each 0  . ferrous sulfate 325 (65 FE) MG tablet Take 325 mg by mouth every other day. (Patient not taking: Reported on 09/26/2019)    . Misc. Devices (BREAST PUMP) MISC Electric double pump - Breast pump for client to use while breastfeeding. 1 each 0  . nystatin-triamcinolone ointment (MYCOLOG) Apply 1 application topically 2 (two) times daily. 30 g 0   No current facility-administered medications for this visit.    REVIEW OF SYSTEMS:    10 Point review of Systems was done is negative except as noted above.  PHYSICAL EXAMINATION: ECOG PERFORMANCE STATUS: 0 - Asymptomatic  . Vitals:   10/24/19 1128  BP: 127/76  Pulse: 78  Resp: 16  Temp: 97.9 F (36.6 C)  SpO2: 100%   Filed Weights   10/24/19 1128  Weight: 145 lb 14.4 oz (66.2 kg)   .Body mass index is 26.69 kg/m.  GENERAL:alert, in no acute distress and comfortable SKIN: no acute rashes, no significant lesions EYES: conjunctiva are pink and non-injected, sclera anicteric OROPHARYNX: MMM, no exudates, no oropharyngeal erythema or ulceration NECK: supple, no JVD LYMPH:  no palpable lymphadenopathy in the cervical, axillary or inguinal regions LUNGS: clear to auscultation b/l with normal respiratory effort HEART: regular rate & rhythm ABDOMEN:  normoactive bowel sounds , non tender, not distended. Extremity: no  pedal edema PSYCH: alert & oriented x 3 with fluent speech NEURO: no focal motor/sensory deficits  LABORATORY DATA:  I have reviewed the data as listed  . CBC Latest Ref Rng & Units 10/24/2019 10/12/2019 08/22/2019  WBC 4.0 - 10.5 K/uL 7.2 7.1 10.1  Hemoglobin 12.0 - 15.0 g/dL 94.1 74.0 81.4  Hematocrit 36 - 46 % 40.5 39.1 39.0  Platelets 150 - 400 K/uL 78(L) 70(LL) 133(L)    . CMP Latest Ref Rng & Units 10/24/2019 08/22/2019  Glucose 70 - 99 mg/dL 86 77  BUN 6 - 20 mg/dL 5(L) 6  Creatinine 4.81 - 1.00 mg/dL 8.56 3.14(H)  Sodium 702 - 145 mmol/L 137 138  Potassium 3.5 - 5.1 mmol/L 3.9 4.0  Chloride 98 - 111 mmol/L 107 102  CO2 22 - 32 mmol/L 21(L) 22  Calcium 8.9 - 10.3 mg/dL 6.3(Z) 9.0  Total Protein 6.5 -  8.1 g/dL 6.5 6.2  Total Bilirubin 0.3 - 1.2 mg/dL 0.3 <9.4  Alkaline Phos 38 - 126 U/L 107 78  AST 15 - 41 U/L 17 14  ALT 0 - 44 U/L 16 9     RADIOGRAPHIC STUDIES: I have personally reviewed the radiological images as listed and agreed with the findings in the report. No results found.  ASSESSMENT & PLAN:   29 yo with   1) Thrombocytopenia in 3rd pregnancy (patient is currently at term @ 38 weeks) Patient reports gestational thrombocytopenia with platelets in the 80k range with her previous pregnancy. She notes normal PLT between pregnancies suggesting against ITP. Her previous labs showed ex vivo platelet aggregation suggestive of an element of pseudothrombocytopenia PLAN: -Discussed patient's most recent labs from 10/12/2019, all values are WNL except for PLT at 70K. -Advised pt that during the course of pregnancy it is natural for PLT to decline but with gestational thrombocytopenia alone they seldowm drop below 70k -Advised pt that in addition to gestational thrombocytopenia there is some element of pseudothrombocytopenia, as there was significant PLT clumping on last labs.  -Unlikely pt has immune thrombocytopenia as low PLT have only been associated with pregnancy.   -Advised pt that we would want PLT >70-80K for an Epidural and C section with epidural/spinal anesthesia -Recommend pt elevate feet, wear compression socks, and sleep in the left lateral position.  -Continue pre-natal vitamins  -Will get labs today (run in a citrate tube)    FOLLOW UP: RTC with Dr Candise Che as per labs  . Orders Placed This Encounter  Procedures  . CBC with Differential/Platelet    Standing Status:   Future    Number of Occurrences:   1    Standing Expiration Date:   10/23/2020  . CMP (Cancer Center only)    Standing Status:   Future    Number of Occurrences:   1    Standing Expiration Date:   10/23/2020  . Platelet by Citrate    Standing Status:   Future    Number of Occurrences:   1    Standing Expiration Date:   10/23/2020  . Vitamin B12    Standing Status:   Future    Number of Occurrences:   1    Standing Expiration Date:   10/23/2020  . Ferritin    Standing Status:   Future    Number of Occurrences:   1    Standing Expiration Date:   10/23/2020  . Iron and TIBC    Standing Status:   Future    Number of Occurrences:   1    Standing Expiration Date:   10/23/2020  . Folate RBC    Standing Status:   Future    Number of Occurrences:   1    Standing Expiration Date:   10/23/2020  . Immature Platelet Fraction    Standing Status:   Future    Number of Occurrences:   1    Standing Expiration Date:   10/23/2020     All of the patients questions were answered with apparent satisfaction. The patient knows to call the clinic with any problems, questions or concerns.  I spent 30 mins counseling the patient face to face. The total time spent in the appointment was 45 minutes and more than 50% was on counseling and direct patient cares.    Wyvonnia Lora MD MS AAHIVMS [facility] Hematology/Oncology Physician [facility]  (Office):  [phone] (Work cell):  [phone] (Fax):           [phone]  10/24/2019 1:04 PM  I, Carollee HerterJazzmine Knight, am  acting as a scribe for Dr. Wyvonnia LoraGautam Ladarrian Asencio.   .I have reviewed the above documentation for accuracy and completeness, and I agree with the above. Johney Maine.Jackelyne Sayer Kishore Ozzy Bohlken MD   ADDENDUM   . CBC Latest Ref Rng & Units 10/24/2019 10/24/2019 10/12/2019  WBC 4.0 - 10.5 K/uL 7.2 - 7.1  Hemoglobin 12.0 - 15.0 g/dL 52.813.6 - 41.313.4  Hematocrit 34.0 - 46.6 % 41.1 40.5 39.1  Platelets 150 - 400 K/uL 78(L) - 70(LL)    . CMP Latest Ref Rng & Units 10/24/2019 08/22/2019  Glucose 70 - 99 mg/dL 86 77  BUN 6 - 20 mg/dL 5(L) 6  Creatinine 2.440.44 - 1.00 mg/dL 0.100.58 2.72(Z0.49(L)  Sodium 366135 - 145 mmol/L 137 138  Potassium 3.5 - 5.1 mmol/L 3.9 4.0  Chloride 98 - 111 mmol/L 107 102  CO2 22 - 32 mmol/L 21(L) 22  Calcium 8.9 - 10.3 mg/dL 4.4(I8.8(L) 9.0  Total Protein 6.5 - 8.1 g/dL 6.5 6.2  Total Bilirubin 0.3 - 1.2 mg/dL 0.3 <3.4<0.2  Alkaline Phos 38 - 126 U/L 107 78  AST 15 - 41 U/L 17 14  ALT 0 - 44 U/L 16 9   Component     Latest Ref Rng & Units 10/24/2019  Iron     41 - 142 ug/dL 742153 (H)  TIBC     595236 - 444 ug/dL 638499 (H)  Saturation Ratios     21 - 57 % 31  UIBC     120 - 384 ug/dL 756346  Folate, Hemolysate     Not Estab. ng/mL 474.0  HCT     34.0 - 46.6 % 41.1  Folate, RBC     >498 ng/mL 1,153  Immature Platelet Fraction     1.2 - 8.6 % 20.2 (H)  Ferritin     11 - 307 ng/mL 25  Vitamin B12     180 - 914 pg/mL 643  Platelet CT in Citrate      EDTA platelet count consistent with citrate.    A_ Platelets counts are today @ 78K Plan - findings consistent with gestation thrombocytopenia with some element of pseudothrombocytopenia -no acute intervention recommended for thrombocytopenia at this time. -no indication for steroids or IVIG -would recommend subsequent CBC/diff in citrate or heparin tube for accurate platelet counts -anticipate platelets to normalize after delivery. -continue prenatal vitamins -continue f/u with OBGyn team. -call if questions.

## 2019-10-24 NOTE — Patient Instructions (Signed)
Thank you for choosing [facility] to provide your oncology and hematology care.   Should you have questions after your visit to the [facility] ([facility]), please contact this office at [phone] between 8:30 AM and 4:30 PM.  Voice mails left after 4:00 PM may not be returned until the following business day.  Calls received after 4:30 PM will be answered by an off-site Nurse Triage Line.    Prescription Refills:  Please have your pharmacy contact us directly for most prescription requests.  Contact the office directly for refills of narcotics (pain medications). Allow 48-72 hours for refills.  Appointments: Please contact the [facility] scheduling department [phone] for questions regarding [facility] appointment scheduling.  Contact the schedulers with any scheduling changes so that your appointment can be rescheduled in a timely manner.   Central Scheduling for [place] [phone] - Call to schedule procedures such as PET scans, CT scans, MRI, Ultrasound, etc.  To afford each patient quality time with our providers, please arrive 30 minutes before your scheduled appointment time.  If you arrive late for your appointment, you may be asked to reschedule.  We strive to give you quality time with our providers, and arriving late affects you and other patients whose appointments are after yours. If you are a no show for multiple scheduled visits, you may be dismissed from the clinic at the providers discretion.     Resources: [facility] Social Workers 336-832-0950 for additional information on assistance programs or assistance connecting with community support programs   [place] DSS  336-641-3447: Information regarding food stamps, Medicaid, and utility assistance [place] 336-333-6589   [place] [employer]'s shared-ride transportation service for eligible riders who have a disability that prevents them from riding the fixed route bus.   Medicare Rights Center  800-333-4114 Helps people with Medicare understand their rights and benefits, navigate the Medicare system, and secure the quality healthcare they deserve [employer] 800-227-2345 Assists patients locate various types of support and financial assistance Cancer Care: 1-800-813-HOPE (4673) Provides financial assistance, online support groups, medication/co-pay assistance.   Transportation Assistance for appointments at [facility]: Transportation Coordinator [phone]  Again, thank you for choosing [facility] for your care.       

## 2019-10-25 LAB — FOLATE RBC
Folate, Hemolysate: 474 ng/mL
Folate, RBC: 1153 ng/mL (ref >498)
Hematocrit: 41.1 % (ref 34.0–46.6)

## 2019-10-26 ENCOUNTER — Other Ambulatory Visit: Payer: Self-pay

## 2019-10-26 ENCOUNTER — Ambulatory Visit (INDEPENDENT_AMBULATORY_CARE_PROVIDER_SITE_OTHER): Payer: 59 | Admitting: Obstetrics and Gynecology

## 2019-10-26 VITALS — BP 125/67 | HR 78 | Wt 147.0 lb

## 2019-10-26 DIAGNOSIS — D696 Thrombocytopenia, unspecified: Secondary | ICD-10-CM | POA: Diagnosis not present

## 2019-10-26 DIAGNOSIS — Z98891 History of uterine scar from previous surgery: Secondary | ICD-10-CM

## 2019-10-26 DIAGNOSIS — Z3A38 38 weeks gestation of pregnancy: Secondary | ICD-10-CM

## 2019-10-26 DIAGNOSIS — O34219 Maternal care for unspecified type scar from previous cesarean delivery: Secondary | ICD-10-CM

## 2019-10-26 DIAGNOSIS — O0993 Supervision of high risk pregnancy, unspecified, third trimester: Secondary | ICD-10-CM | POA: Diagnosis not present

## 2019-10-26 DIAGNOSIS — O99113 Other diseases of the blood and blood-forming organs and certain disorders involving the immune mechanism complicating pregnancy, third trimester: Secondary | ICD-10-CM

## 2019-10-26 DIAGNOSIS — O0990 Supervision of high risk pregnancy, unspecified, unspecified trimester: Secondary | ICD-10-CM

## 2019-10-26 LAB — POCT URINALYSIS DIP (DEVICE)
Bilirubin Urine: NEGATIVE
Glucose, UA: NEGATIVE mg/dL
Hgb urine dipstick: NEGATIVE
Ketones, ur: NEGATIVE mg/dL
Leukocytes,Ua: NEGATIVE
Nitrite: NEGATIVE
Protein, ur: NEGATIVE mg/dL
Specific Gravity, Urine: 1.02 (ref 1.005–1.030)
Urobilinogen, UA: 0.2 mg/dL (ref 0.0–1.0)
pH: 7 (ref 5.0–8.0)

## 2019-10-26 NOTE — Progress Notes (Signed)
   PRENATAL VISIT NOTE  Subjective:  Michelle Velez is a 29 y.o. G3P1011 at [redacted]w[redacted]d being seen today for ongoing prenatal care.  She is currently monitored for the following issues for this low-risk pregnancy and has Supervision of high risk pregnancy, antepartum; History of cesarean delivery; and Gestational thrombocytopenia without hemorrhage in third trimester [facility]) on their problem list.  Patient reports no complaints.  Contractions: Not present. Vag. Bleeding: None.  Movement: Present. Denies leaking of fluid. Urgency and frequency.   The following portions of the patient's history were reviewed and updated as appropriate: allergies, current medications, past family history, past medical history, past social history, past surgical history and problem list.   Objective:   Vitals:   10/26/19 1540  BP: 125/67  Pulse: 78  Weight: 147 lb (66.7 kg)    Fetal Status: Fetal Heart Rate (bpm): 135   Movement: Present     General:  Alert, oriented and cooperative. Patient is in no acute distress.  Skin: Skin is warm and dry. No rash noted.   Cardiovascular: Normal heart rate noted  Respiratory: Normal respiratory effort, no problems with respiration noted  Abdomen: Soft, gravid, appropriate for gestational age.  Pain/Pressure: Present     Pelvic: Cervical exam performed in the presence of a chaperone        Extremities: Normal range of motion.  Edema: None  Mental Status: Normal mood and affect. Normal behavior. Normal judgment and thought content.   Assessment and Plan:  Pregnancy: G3P1011 at [redacted]w[redacted]d  1. Supervision of high risk pregnancy, antepartum  Plans for TOLAC, however concerned about platelet count and epidural.    2. Gestational thrombocytopenia without hemorrhage in third trimester [facility])  Recently saw hematologist. She is unsure what the plan is. Providers note is incomplete at this time. Encouraged patient to call the office for f/u from labs drawn with them.  Last platelet  count on 7/13: 78 Next visit with MD to discuss delivery plan.    3. History of cesarean delivery  TOLAC- consent signed.   Term labor symptoms and general obstetric precautions including but not limited to vaginal bleeding, contractions, leaking of fluid and fetal movement were reviewed in detail with the patient. Please refer to After Visit Summary for other counseling recommendations.   Return in about 1 week (around 11/02/2019) for With MD only for plan and discussion of delivery .  Future Appointments  Date Time Provider Department Center  11/02/2019  9:15 AM Warden Fillers, MD [facility]  11/09/2019  1:15 PM [facility]  11/09/2019  2:15 PM Kristain Hu, Harolyn Rutherford, NP [facility]  11/20/2019 10:45 AM [facility]    Venia Carbon, NP

## 2019-10-26 NOTE — Patient Instructions (Signed)

## 2019-10-30 ENCOUNTER — Telehealth: Payer: Self-pay | Admitting: *Deleted

## 2019-10-30 NOTE — Telephone Encounter (Signed)
Patient called for results from lab work on 7/13. Dr. Candise Che informed.  Patient contacted with Dr. Clyda Greener response: Platelets counts on rpt labs a little better @ 78K  Plan  - findings consistent with gestation thrombocytopenia with some element of pseudothrombocytopenia  -no acute intervention recommended for thrombocytopenia at this time.  -no indication for steroids or IVIG -would recommend subsequent CBC/diff in citrate or heparin tube for accurate platelet counts  -anticipate platelets to normalize after delivery.  -continue prenatal vitamins  -continue f/u with OBGyn team. Dr. Candise Che sent note to referring provider  Patient verbalized understanding and states she will f/u with her OB

## 2019-11-02 ENCOUNTER — Ambulatory Visit (INDEPENDENT_AMBULATORY_CARE_PROVIDER_SITE_OTHER): Payer: 59 | Admitting: Obstetrics and Gynecology

## 2019-11-02 ENCOUNTER — Encounter (HOSPITAL_COMMUNITY): Payer: Self-pay | Admitting: *Deleted

## 2019-11-02 ENCOUNTER — Other Ambulatory Visit: Payer: Self-pay

## 2019-11-02 ENCOUNTER — Encounter: Payer: Self-pay | Admitting: Medical

## 2019-11-02 ENCOUNTER — Telehealth (HOSPITAL_COMMUNITY): Payer: Self-pay | Admitting: *Deleted

## 2019-11-02 VITALS — BP 122/70 | HR 80 | Wt 147.3 lb

## 2019-11-02 DIAGNOSIS — Z98891 History of uterine scar from previous surgery: Secondary | ICD-10-CM

## 2019-11-02 DIAGNOSIS — Z3A39 39 weeks gestation of pregnancy: Secondary | ICD-10-CM

## 2019-11-02 DIAGNOSIS — O99113 Other diseases of the blood and blood-forming organs and certain disorders involving the immune mechanism complicating pregnancy, third trimester: Secondary | ICD-10-CM

## 2019-11-02 DIAGNOSIS — O0993 Supervision of high risk pregnancy, unspecified, third trimester: Secondary | ICD-10-CM

## 2019-11-02 DIAGNOSIS — D696 Thrombocytopenia, unspecified: Secondary | ICD-10-CM

## 2019-11-02 DIAGNOSIS — O0990 Supervision of high risk pregnancy, unspecified, unspecified trimester: Secondary | ICD-10-CM

## 2019-11-02 NOTE — Patient Instructions (Signed)

## 2019-11-02 NOTE — Addendum Note (Signed)
Addended by: Warden Fillers on: 11/02/2019 11:02 AM   Modules accepted: Orders, SmartSet

## 2019-11-02 NOTE — Progress Notes (Signed)
   PRENATAL VISIT NOTE  Subjective:  Michelle Velez is a 29 y.o. G3P1011 at [redacted]w[redacted]d being seen today for ongoing prenatal care.  She is currently monitored for the following issues for this high-risk pregnancy and has Supervision of high risk pregnancy, antepartum; History of cesarean delivery; and Gestational thrombocytopenia without hemorrhage in third trimester ([facility]CC) on their problem list.  Patient doing well with no acute concerns today. She reports no complaints.  Contractions: Irritability. Vag. Bleeding: None.  Movement: Present. Denies leaking of fluid.   The following portions of the patient's history were reviewed and updated as appropriate: allergies, current medications, past family history, past medical history, past social history, past surgical history and problem list. Problem list updated.  Objective:   Vitals:   11/02/19 0953  BP: 122/70  Pulse: 80  Weight: 147 lb 4.8 oz (66.8 kg)    Fetal Status: Fetal Heart Rate (bpm): 141   Movement: Present     General:  Alert, oriented and cooperative. Patient is in no acute distress.  Skin: Skin is warm and dry. No rash noted.   Cardiovascular: Normal heart rate noted  Respiratory: Normal respiratory effort, no problems with respiration noted  Abdomen: Soft, gravid, appropriate for gestational age.  Pain/Pressure: Present     Pelvic: Cervical exam performed Dilation: Fingertip Effacement (%): 50 Station: -3  Extremities: Normal range of motion.  Edema: None  Mental Status:  Normal mood and affect. Normal behavior. Normal judgment and thought content.   Assessment and Plan:  Pregnancy: G3P1011 at [redacted]w[redacted]d  1. Gestational thrombocytopenia without hemorrhage in third trimester (HCC) Last platelets 78, per oncology no intervention at this time, see note, recommends cbc in citrate for accurate platelet count  2. Supervision of high risk pregnancy, antepartum   3. History of cesarean delivery Pt desires VBAC, discussed IOL in  detail, will schedule on or after 7/30  Term labor symptoms and general obstetric precautions including but not limited to vaginal bleeding, contractions, leaking of fluid and fetal movement were reviewed in detail with the patient.  Please refer to After Visit Summary for other counseling recommendations.   Return in 1 week (on 11/09/2019) for [facility], in person.   Mariel Aloe, MD

## 2019-11-02 NOTE — Telephone Encounter (Signed)
Preadmission screen  

## 2019-11-04 ENCOUNTER — Other Ambulatory Visit: Payer: Self-pay | Admitting: Family Medicine

## 2019-11-05 ENCOUNTER — Inpatient Hospital Stay (HOSPITAL_COMMUNITY)
Admission: AD | Admit: 2019-11-05 | Discharge: 2019-11-10 | DRG: 786 | Disposition: A | Discharge status: Home / Self Care | Payer: 59 | Attending: Obstetrics and Gynecology | Admitting: Obstetrics and Gynecology

## 2019-11-05 ENCOUNTER — Encounter (HOSPITAL_COMMUNITY): Payer: Self-pay | Admitting: Obstetrics & Gynecology

## 2019-11-05 ENCOUNTER — Other Ambulatory Visit: Payer: Self-pay

## 2019-11-05 ENCOUNTER — Inpatient Hospital Stay (HOSPITAL_COMMUNITY): Payer: 59 | Admitting: Anesthesiology

## 2019-11-05 DIAGNOSIS — O99284 Endocrine, nutritional and metabolic diseases complicating childbirth: Secondary | ICD-10-CM | POA: Diagnosis present

## 2019-11-05 DIAGNOSIS — O1404 Mild to moderate pre-eclampsia, complicating childbirth: Secondary | ICD-10-CM | POA: Diagnosis present

## 2019-11-05 DIAGNOSIS — E876 Hypokalemia: Secondary | ICD-10-CM | POA: Diagnosis present

## 2019-11-05 DIAGNOSIS — Z98891 History of uterine scar from previous surgery: Secondary | ICD-10-CM

## 2019-11-05 DIAGNOSIS — D6959 Other secondary thrombocytopenia: Secondary | ICD-10-CM | POA: Diagnosis present

## 2019-11-05 DIAGNOSIS — O9963 Diseases of the digestive system complicating the puerperium: Secondary | ICD-10-CM | POA: Diagnosis not present

## 2019-11-05 DIAGNOSIS — O1400 Mild to moderate pre-eclampsia, unspecified trimester: Secondary | ICD-10-CM | POA: Diagnosis not present

## 2019-11-05 DIAGNOSIS — O411230 Chorioamnionitis, third trimester, not applicable or unspecified: Secondary | ICD-10-CM | POA: Diagnosis present

## 2019-11-05 DIAGNOSIS — K567 Ileus, unspecified: Secondary | ICD-10-CM | POA: Diagnosis not present

## 2019-11-05 DIAGNOSIS — Z20822 Contact with and (suspected) exposure to covid-19: Secondary | ICD-10-CM | POA: Diagnosis present

## 2019-11-05 DIAGNOSIS — K9189 Other postprocedural complications and disorders of digestive system: Secondary | ICD-10-CM | POA: Diagnosis not present

## 2019-11-05 DIAGNOSIS — D696 Thrombocytopenia, unspecified: Secondary | ICD-10-CM | POA: Diagnosis present

## 2019-11-05 DIAGNOSIS — O34211 Maternal care for low transverse scar from previous cesarean delivery: Principal | ICD-10-CM | POA: Diagnosis present

## 2019-11-05 DIAGNOSIS — R140 Abdominal distension (gaseous): Secondary | ICD-10-CM

## 2019-11-05 DIAGNOSIS — Z3A40 40 weeks gestation of pregnancy: Secondary | ICD-10-CM

## 2019-11-05 DIAGNOSIS — O34219 Maternal care for unspecified type scar from previous cesarean delivery: Secondary | ICD-10-CM | POA: Diagnosis present

## 2019-11-05 DIAGNOSIS — O26893 Other specified pregnancy related conditions, third trimester: Secondary | ICD-10-CM | POA: Diagnosis present

## 2019-11-05 DIAGNOSIS — O0990 Supervision of high risk pregnancy, unspecified, unspecified trimester: Secondary | ICD-10-CM

## 2019-11-05 DIAGNOSIS — Z4659 Encounter for fitting and adjustment of other gastrointestinal appliance and device: Secondary | ICD-10-CM

## 2019-11-05 DIAGNOSIS — O1494 Unspecified pre-eclampsia, complicating childbirth: Secondary | ICD-10-CM | POA: Diagnosis not present

## 2019-11-05 DIAGNOSIS — O9912 Other diseases of the blood and blood-forming organs and certain disorders involving the immune mechanism complicating childbirth: Secondary | ICD-10-CM | POA: Diagnosis present

## 2019-11-05 DIAGNOSIS — O411290 Chorioamnionitis, unspecified trimester, not applicable or unspecified: Secondary | ICD-10-CM | POA: Diagnosis not present

## 2019-11-05 LAB — CBC
HCT: 42.6 % (ref 36.0–46.0)
Hemoglobin: 14 g/dL (ref 12.0–15.0)
MCH: 30.7 pg (ref 26.0–34.0)
MCHC: 32.9 g/dL (ref 30.0–36.0)
MCV: 93.4 fL (ref 80.0–100.0)
Platelets: 77 10*3/uL — ABNORMAL LOW (ref 150–400)
RBC: 4.56 MIL/uL (ref 3.87–5.11)
RDW: 12.6 % (ref 11.5–15.5)
WBC: 8.1 10*3/uL (ref 4.0–10.5)
nRBC: 0 % (ref 0.0–0.2)

## 2019-11-05 LAB — RPR: RPR Ser Ql: NONREACTIVE

## 2019-11-05 LAB — COMPREHENSIVE METABOLIC PANEL
ALT: 16 U/L (ref 0–44)
AST: 22 U/L (ref 15–41)
Albumin: 2.9 g/dL — ABNORMAL LOW (ref 3.5–5.0)
Alkaline Phosphatase: 105 U/L (ref 38–126)
Anion gap: 11 (ref 5–15)
BUN: 5 mg/dL — ABNORMAL LOW (ref 6–20)
CO2: 24 mmol/L (ref 22–32)
Calcium: 9.1 mg/dL (ref 8.9–10.3)
Chloride: 104 mmol/L (ref 98–111)
Creatinine, Ser: 0.7 mg/dL (ref 0.44–1.00)
GFR calc Af Amer: >60 mL/min (ref >60)
GFR calc non Af Amer: >60 mL/min (ref >60)
Glucose, Bld: 84 mg/dL (ref 70–99)
Potassium: 3.7 mmol/L (ref 3.5–5.1)
Sodium: 139 mmol/L (ref 135–145)
Total Bilirubin: 0.8 mg/dL (ref 0.3–1.2)
Total Protein: 6 g/dL — ABNORMAL LOW (ref 6.5–8.1)

## 2019-11-05 LAB — GLOBAL TEG PANEL
CFF Max Amplitude: 20.7 mm (ref 15–32)
CK with Heparinase (R): 6.5 min (ref 4.3–8.3)
Citrated Functional Fibrinogen: 377.7 mg/dL (ref 278–581)
Citrated Kaolin (K): 1.4 min (ref 0.8–2.1)
Citrated Kaolin (MA): 60.7 mm (ref 52–69)
Citrated Kaolin (R): 6.1 min (ref 4.6–9.1)
Citrated Kaolin Angle: 71.7 deg (ref 63–78)
Citrated Rapid TEG (MA): 62.2 mm (ref 52–70)

## 2019-11-05 LAB — PROTEIN / CREATININE RATIO, URINE
Creatinine, Urine: 93.02 mg/dL
Protein Creatinine Ratio: 0.38 mg/mg{Cre} — ABNORMAL HIGH (ref 0.00–0.15)
Total Protein, Urine: 35 mg/dL

## 2019-11-05 LAB — TYPE AND SCREEN
ABO/RH(D): O POS
Antibody Screen: NEGATIVE

## 2019-11-05 LAB — SARS CORONAVIRUS 2 BY RT PCR (HOSPITAL ORDER, PERFORMED IN ~~LOC~~ HOSPITAL LAB): SARS Coronavirus 2: NEGATIVE

## 2019-11-05 MED ORDER — LACTATED RINGERS IV SOLN: INTRAVENOUS | Status: DC

## 2019-11-05 MED ORDER — OXYTOCIN-SODIUM CHLORIDE 30-0.9 UT/500ML-% IV SOLN: 2.5000 [IU]/h | INTRAVENOUS | Status: DC

## 2019-11-05 MED ORDER — PHENYLEPHRINE 40 MCG/ML (10ML) SYRINGE FOR IV PUSH (FOR BLOOD PRESSURE SUPPORT): 80.0000 ug | PREFILLED_SYRINGE | INTRAVENOUS | Status: DC | PRN

## 2019-11-05 MED ORDER — OXYCODONE-ACETAMINOPHEN 5-325 MG PO TABS: 1.0000 | ORAL_TABLET | ORAL | Status: DC | PRN

## 2019-11-05 MED ORDER — LACTATED RINGERS IV SOLN: 500.0000 mL | Freq: Once | INTRAVENOUS | Status: DC

## 2019-11-05 MED ORDER — CALCIUM CARBONATE ANTACID 500 MG PO CHEW
2.0000 | CHEWABLE_TABLET | Freq: Four times a day (QID) | ORAL | Status: DC | PRN
  Administered 2019-11-05: 400 mg via ORAL
  Filled 2019-11-05: qty 2

## 2019-11-05 MED ORDER — DIPHENHYDRAMINE HCL 50 MG/ML IJ SOLN: 12.5000 mg | INTRAMUSCULAR | Status: DC | PRN

## 2019-11-05 MED ORDER — ACETAMINOPHEN 325 MG PO TABS
650.0000 mg | ORAL_TABLET | ORAL | Status: DC | PRN
  Administered 2019-11-06: 650 mg via ORAL
  Filled 2019-11-05: qty 2

## 2019-11-05 MED ORDER — DIPHENHYDRAMINE HCL 50 MG/ML IJ SOLN
25.0000 mg | Freq: Once | INTRAMUSCULAR | Status: AC
  Administered 2019-11-05: 25 mg via INTRAVENOUS
  Filled 2019-11-05: qty 1

## 2019-11-05 MED ORDER — FENTANYL CITRATE (PF) 100 MCG/2ML IJ SOLN: 100.0000 ug | INTRAMUSCULAR | Status: DC | PRN

## 2019-11-05 MED ORDER — OXYTOCIN BOLUS FROM INFUSION: 333.0000 mL | Freq: Once | INTRAVENOUS | Status: DC

## 2019-11-05 MED ORDER — ONDANSETRON HCL 4 MG/2ML IJ SOLN
4.0000 mg | Freq: Four times a day (QID) | INTRAMUSCULAR | Status: DC | PRN
  Administered 2019-11-06: 4 mg via INTRAVENOUS
  Filled 2019-11-05: qty 2

## 2019-11-05 MED ORDER — LIDOCAINE HCL (PF) 1 % IJ SOLN
INTRAMUSCULAR | Status: DC | PRN
  Administered 2019-11-05 (×2): 4 mL via EPIDURAL

## 2019-11-05 MED ORDER — OXYCODONE-ACETAMINOPHEN 5-325 MG PO TABS: 2.0000 | ORAL_TABLET | ORAL | Status: DC | PRN

## 2019-11-05 MED ORDER — LIDOCAINE HCL (PF) 1 % IJ SOLN: 30.0000 mL | INTRAMUSCULAR | Status: DC | PRN

## 2019-11-05 MED ORDER — EPHEDRINE 5 MG/ML INJ: 10.0000 mg | INTRAVENOUS | Status: DC | PRN

## 2019-11-05 MED ORDER — OXYTOCIN-SODIUM CHLORIDE 30-0.9 UT/500ML-% IV SOLN
1.0000 m[IU]/min | INTRAVENOUS | Status: DC
  Administered 2019-11-05: 1 m[IU]/min via INTRAVENOUS
  Filled 2019-11-05: qty 500

## 2019-11-05 MED ORDER — LACTATED RINGERS IV SOLN: 500.0000 mL | INTRAVENOUS | Status: DC | PRN

## 2019-11-05 MED ORDER — FENTANYL-BUPIVACAINE-NACL 0.5-0.125-0.9 MG/250ML-% EP SOLN
12.0000 mL/h | EPIDURAL | Status: DC | PRN
  Filled 2019-11-05: qty 250

## 2019-11-05 MED ORDER — SODIUM CHLORIDE (PF) 0.9 % IJ SOLN
INTRAMUSCULAR | Status: DC | PRN
  Administered 2019-11-05: 11 mL/h via EPIDURAL

## 2019-11-05 MED ORDER — TERBUTALINE SULFATE 1 MG/ML IJ SOLN: 0.2500 mg | Freq: Once | INTRAMUSCULAR | Status: DC | PRN

## 2019-11-05 MED ORDER — SOD CITRATE-CITRIC ACID 500-334 MG/5ML PO SOLN
30.0000 mL | ORAL | Status: DC | PRN
  Administered 2019-11-06: 30 mL via ORAL
  Filled 2019-11-05: qty 30

## 2019-11-05 NOTE — Progress Notes (Signed)
Labor Progress Note Michelle Velez is a 29 y.o. G3P1011 at [redacted]w[redacted]d presented for SOL.  S:  Comfortable at this time. No concerns.   O:  BP 120/66   Pulse 74   Temp 97.6 F (36.4 C) (Oral)   Resp 18   Ht 5\' 2"  (1.575 m)   Wt 66.8 kg   LMP 01/29/2019   BMI 26.94 kg/m    CVE: Dilation: 3 Effacement (%): 80 Cervical Position: Posterior Station: -2 Presentation: Vertex Exam by:: Dr. [email or web address]   FHT: FHR 140 bpm, moderate variabilty, + accels, - decels.  UC irregular    A&P: Michelle Velez is a 29 y.o. G3P1011 [redacted]w[redacted]d here for SOL with hx of TOLAC. #Labor: Continue expectant management. Discussed with patient the option of starting pitocin and patient would like to think about it for 30 minutes before moving forward.  #Pain: per patient request #FWB: Category I #GBS negative  #TOLAC: due to arrest of dilation at 9 cm. ROT infant 6#11. uncomplicated LTCS. #Gestational Thrombocytopenia: per chart review from heme/onc note( 10/24/19) recommend need PLT > 70-80 K for epidural/spinal 2/2 rCS. Concern for pseudothrombocytopenia. Dr. 10/26/19 has already discussed with patient if repeat CS then the possibility of general anesthesia pending platelets. CBC AM: platelets 77 otherwise unremarkable.    Salomon Mast, DO 9:03 AM

## 2019-11-05 NOTE — MAU Note (Signed)
Pt here with reports of contractions every 5-6 minutes since around 1045. She denies LOF or vaginal bleeding. Reports good fetal movement. Cervix 1cm on Thursday.

## 2019-11-05 NOTE — H&P (Signed)
OBSTETRIC ADMISSION HISTORY AND PHYSICAL  Michelle Velez is a 29 y.o. female G3P1011 with IUP at [redacted]w[redacted]d presenting for SOL. She reports +FMs. No LOF, VB, blurry vision, headaches, peripheral edema, or RUQ pain. She plans on breast feeding. She requests condoms for birth control.  She received her PNC at [facility]  Dating: By LMP --->  Estimated Date of Delivery: 11/05/19  Sono:   @[redacted]w[redacted]d , normal anatomy, variable presentation, 246g, 45%ile, EFW 0#9 -posterior placenta  Prenatal History/Complications: H/o Cesarean Delivery (2016) Gestational Thrombocytopenia  Past Medical History: Past Medical History:  Diagnosis Date  . Anemia   . Gestational thrombocytopenia (HCC)   . Inguinal hernia 2019  . Scoliosis 2006    Past Surgical History: Past Surgical History:  Procedure Laterality Date  . CESAREAN SECTION      Obstetrical History: OB History    Gravida  3   Para  1   Term  1   Preterm      AB  1   Living  1     SAB  1   TAB      Ectopic      Multiple      Live Births  1           Social History: Social History   Socioeconomic History  . Marital status: Single    Spouse name: Not on file  . Number of children: Not on file  . Years of education: Not on file  . Highest education level: Not on file  Occupational History  . Not on file  Tobacco Use  . Smoking status: Never Smoker  . Smokeless tobacco: Never Used  Vaping Use  . Vaping Use: Never used  Substance and Sexual Activity  . Alcohol use: Never  . Drug use: Never  . Sexual activity: Yes    Birth control/protection: None  Other Topics Concern  . Not on file  Social History Narrative  . Not on file   Social Determinants of Health   Financial Resource Strain:   . Difficulty of Paying Living Expenses:   Food Insecurity: No Food Insecurity  . Worried About 2007 in the Last Year: Never true  . Ran Out of Food in the Last Year: Never true  Transportation Needs: No  Transportation Needs  . Lack of Transportation (Medical): No  . Lack of Transportation (Non-Medical): No  Physical Activity:   . Days of Exercise per Week:   . Minutes of Exercise per Session:   Stress:   . Feeling of Stress :   Social Connections:   . Frequency of Communication with Friends and Family:   . Frequency of Social Gatherings with Friends and Family:   . Attends Religious Services:   . Active Member of Clubs or Organizations:   . Attends Programme researcher, broadcasting/film/video Meetings:   Banker Marital Status:     Family History: Family History  Problem Relation Age of Onset  . Asthma Mother   . Asthma Brother   . Diabetes Maternal Grandmother     Allergies: No Known Allergies  Medications Prior to Admission  Medication Sig Dispense Refill Last Dose  . Misc. Devices (BREAST PUMP) MISC Electric double pump - Breast pump for client to use while breastfeeding. (Patient not taking: Reported on 10/26/2019) 1 each 0   . Prenatal Vit-Fe Fumarate-FA (MULTIVITAMIN-PRENATAL) 27-0.8 MG TABS tablet Take 1 tablet by mouth daily at 12 noon.        Review of Systems:  All systems reviewed and negative except as stated in HPI  PE: Blood pressure 125/71, pulse 77, temperature 98.4 F (36.9 C), resp. rate 20, last menstrual period 01/29/2019. General appearance: alert, cooperative and breathing through contractions Lungs: regular rate and effort Heart: regular rate  Abdomen: soft, non-tender Extremities: Homans sign is negative, no sign of DVT Presentation: cephalic EFM: 140 bpm, moderate variability, 10x10/15x15 accels, no decels Toco: CTX q69m Dilation: 2.5 Effacement (%): 80 Station: -3 Exam by:: Camelia Eng, RN  Prenatal labs: ABO, Rh: O/Positive/-- (12/31 1059) Antibody: Negative (12/31 1059) Rubella: 3.13 (12/31 1059) RPR: Non Reactive (04/27 0948)  HBsAg: Negative (12/31 1059)  HIV: Non Reactive (04/27 0948)  GBS: Negative/-- (07/01 1429)  2 hr GTT 71/131/133  Prenatal  Transfer Tool  Maternal Diabetes: No Genetic Screening: Normal Maternal Ultrasounds/Referrals: Normal Fetal Ultrasounds or other Referrals:  Other: Heme/Onc 2/2 gestational thrombocytopenia Maternal Substance Abuse:  No Significant Maternal Medications:  None Significant Maternal Lab Results: Group B Strep negative  No results found for this or any previous visit (from the past 24 hour(s)).  Patient Active Problem List   Diagnosis Date Noted  . Gestational thrombocytopenia without hemorrhage in third trimester (HCC) 08/22/2019  . Supervision of high risk pregnancy, antepartum 04/13/2019  . History of cesarean delivery 04/13/2019    Assessment: Michelle Velez is a 29 y.o. G3P1011 at [redacted]w[redacted]d here for SOL/TOLAC  1. Labor: expectant management, consider augmentation if appropriate 2. FWB: Cat I 3. Pain: per patient request. Patient does plan for epidural, recommended dry-cath 2/2 gestational thrombocytopenia 4. GBS: Negative 5. TOLAC: Op Note reviewed in [facility] (03/15/15). Arrest of dilation at 9 cm, ROT infant, 6#11, uncomplicated LTCS. 6. Gestational Thrombocytopenia. Hem Onc appt 10/24/19 - notes state need PLT > 70-80K for epidural/spinal 2/2 rC/S, possible pseudothrombocytopnia (see note in Epic). Discussed if RCS needed, may have to be under general anestheisa pending platelets. CBC pending.   Plan: Admit to L&D.  Reviewed risks/benefits of TOLAC versus RCS in detail. Patient counseled regarding potential vaginal delivery, chance of success, future implications, possible uterine rupture and need for urgent/emergent repeat cesarean. Counseled regarding potential need for repeat c-section for reasons unrelated to first c-section. Counseled regarding scheduled repeat cesarean including risks of bleeding, infection, damage to surrounding tissue, abnormal placentation, implications for future pregnancies. All questions answered.  Patient desires TOLAC, consent signed 08/08/19.  Michelle Velez  L Michelle Janowiak, DO  11/05/2019, 4:03 AM

## 2019-11-05 NOTE — Progress Notes (Signed)
Call to OB anesthesia to make aware of platelet count of 78 and previous c/s due to FTP.

## 2019-11-05 NOTE — Progress Notes (Signed)
Labor Progress Note Michelle Velez is a 29 y.o. G3P1011 at [redacted]w[redacted]d presented for SOL.  S:  Comfortable at this time.    O:  BP (!) 137/85   Pulse 73   Temp 97.6 F (36.4 C) (Oral)   Resp 16   Ht 5\' 2"  (1.575 m)   Wt 66.8 kg   LMP 01/29/2019   SpO2 100%   BMI 26.94 kg/m    CVE: Dilation: 7 Effacement (%): 80 Cervical Position: Posterior Station: 0 Presentation: Vertex Exam by:: Dr. [email or web address]   FHT: FHR 130 bpm, moderate variabilty, + accels, + decels (few early's) TOCO: q2-3 mins   A&P: Michelle Velez is a 29 y.o. 26 [redacted]w[redacted]d here for SOL with hx of TOLAC. #Labor: Continue expectant management. Continue titrating pitocin. AROM @ 1654. Placed IUPC at this time. #Pain: per patient request #FWB: Category I #GBS negative  #TOLAC: due to arrest of dilation at 9 cm. ROT infant 6#11. uncomplicated LTCS. #Gestational Thrombocytopenia: per chart review from heme/onc note( 10/24/19) recommend need PLT > 70-80 K for epidural/spinal 2/2 rCS. Concern for pseudothrombocytopenia. Dr. 10/26/19 has already discussed with patient if repeat CS then the possibility of general anesthesia pending platelets. CBC AM: platelets 77 otherwise unremarkable.  #PreEclampsia, Mild: Cr/Protein ratio 0.38, CMP unremarkable. Continue to monitor BP.    Salomon Mast, DO 6:52 PM

## 2019-11-05 NOTE — Anesthesia Procedure Notes (Signed)
Epidural Patient location during procedure: OB Start time: 11/05/2019 12:29 PM End time: 11/05/2019 12:32 PM  Staffing Anesthesiologist: Kaylyn Layer, MD Performed: anesthesiologist   Preanesthetic Checklist Completed: patient identified, IV checked, risks and benefits discussed, monitors and equipment checked, pre-op evaluation and timeout performed  Epidural Patient position: sitting Prep: DuraPrep and site prepped and draped Patient monitoring: continuous pulse ox, blood pressure and heart rate Approach: midline Location: L3-L4 Injection technique: LOR air  Needle:  Needle type: Tuohy  Needle gauge: 17 G Needle length: 9 cm Needle insertion depth: 5 cm Catheter type: closed end flexible Catheter size: 19 Gauge Catheter at skin depth: 10 cm Test dose: negative and Other (1% lidocaine)  Assessment Events: blood not aspirated, injection not painful, no injection resistance, no paresthesia and negative IV test  Additional Notes Patient identified. Risks, benefits, and alternatives discussed with patient including but not limited to bleeding, infection, nerve damage, paralysis, failed block, incomplete pain control, headache, blood pressure changes, nausea, vomiting, reactions to medication, itching, and postpartum back pain. Confirmed with bedside nurse the patient's most recent platelet count. Confirmed with patient that they are not currently taking any anticoagulation, have any bleeding history, or any family history of bleeding disorders. Patient expressed understanding and wished to proceed. All questions were answered. Sterile technique was used throughout the entire procedure. Please see nursing notes for vital signs.   Crisp LOR on first pass. Test dose was given through epidural catheter and negative prior to continuing to dose epidural or start infusion. Warning signs of high block given to the patient including shortness of breath, tingling/numbness in hands, complete  motor block, or any concerning symptoms with instructions to call for help. Patient was given instructions on fall risk and not to get out of bed. All questions and concerns addressed with instructions to call with any issues or inadequate analgesia.  Reason for block:procedure for pain

## 2019-11-05 NOTE — Progress Notes (Signed)
Michelle Velez is a 29 y.o. G3P1011 at [redacted]w[redacted]d admitted for SOL/TOLAC  Subjective: Comfortable with epidural  Objective: BP (!) 148/69   Pulse 73   Temp 98.1 F (36.7 C) (Oral)   Resp 18   Ht 5\' 2"  (1.575 m)   Wt 66.8 kg   LMP 01/29/2019   SpO2 100%   BMI 26.94 kg/m  Total I/O In: -  Out: 350 [Urine:350]  FHT:  FHR: 125 bpm, variability: moderate,  accelerations:  Present,  decelerations:  Present (Early and occasional variables) UC:   regular, every 2-3 minutes, MVUs 240-250 BSUS: OP Bedside Foley catheter is pink tinged- no gross blood  SVE:   Dilation: 7 Effacement (%): 80 Station: 0, Plus 1 Exam by:: [email or web address] RN   Pitocin @ 7 mu/min  Labs: Lab Results  Component Value Date   WBC 8.1 11/05/2019   HGB 14.0 11/05/2019   HCT 42.6 11/05/2019   MCV 93.4 11/05/2019   PLT 77 (L) 11/05/2019    Assessment / Plan: Michelle Velez is a 29 y.o. G3P1011 [redacted]w[redacted]d here for SOL with hx of TOLAC.  #Labor: S/p AROM and on pitocin since 0945. 7 cm since 1840. Benadryl given for cervical swelling. MVUs adequate. Baby is OP on BSUS, will do aggressive position changes in an effort to spin the infant into a more optimal position for delivery. #Pain: comfortable with epidural #FWB: Category I #GBS negative  #TOLAC: due to arrest of dilation at 9 cm. ROT infant 6#11. Uncomplicated LTCS. #Gestational Thrombocytopenia: platelets 77, TEG normal. Now with pink-tinged urine. #PreEclampsia without Severe Features: P:Cr ratio 0.38, CMP unremarkable. Continue to monitor BP. Asymptomatic at this time.  Guarded for vaginal delivery, CS as appropriate  0946 DO OB Fellow, Faculty Practice 11/05/2019, 8:56 PM

## 2019-11-05 NOTE — Progress Notes (Signed)
Labor Progress Note Michelle Velez is a 29 y.o. G3P1011 at [redacted]w[redacted]d presented for SOL.  S:  Comfortable at this time. Feeling sleepy.   O:  BP 124/79   Pulse 57   Temp 97.9 F (36.6 C) (Oral)   Resp 16   Ht 5\' 2"  (1.575 m)   Wt 66.8 kg   LMP 01/29/2019   SpO2 100%   BMI 26.94 kg/m    CVE: Dilation: 4 Effacement (%): 90 Cervical Position: Posterior Station: -2, -1 Presentation: Vertex Exam by:: [email or web address], RNC   FHT: FHR 140 bpm, moderate variabilty, + accels, variable decels, improved with repositioning.  UC q2-3 min   A&P: Michelle Velez is a 29 y.o. 26 [redacted]w[redacted]d here for SOL with hx of TOLAC. #Labor: Pitocin started at 0945, currently running at 3  #Pain: s/p epidural  #FWB: Category I #GBS negative  #TOLAC: due to arrest of dilation at 9 cm. ROT infant 6#11. uncomplicated LTCS. #Gestational Thrombocytopenia: seen by heme/onc note( 10/24/19). Concern for pseudothrombocytopenia.  CBC AM: platelets 77 otherwise unremarkable. S/p Epidural placement.    10/26/19, MD [facility] Fellow, [facility] for [facility], [facility]  1:45 PM

## 2019-11-05 NOTE — Anesthesia Preprocedure Evaluation (Signed)
Anesthesia Evaluation  Patient identified by MRN, date of birth, ID band Patient awake    Reviewed: Allergy & Precautions, Patient's Chart, lab work & pertinent test results  History of Anesthesia Complications Negative for: history of anesthetic complications  Airway Mallampati: II  TM Distance: >3 FB Neck ROM: Full    Dental no notable dental hx.    Pulmonary neg pulmonary ROS,    Pulmonary exam normal        Cardiovascular negative cardio ROS Normal cardiovascular exam     Neuro/Psych negative neurological ROS  negative psych ROS   GI/Hepatic negative GI ROS, Neg liver ROS,   Endo/Other  negative endocrine ROS  Renal/GU negative Renal ROS  negative genitourinary   Musculoskeletal negative musculoskeletal ROS (+)   Abdominal   Peds  Hematology  (+) anemia , Hgb 14.0, plt 77k   Anesthesia Other Findings Day of surgery medications reviewed with patient.  Reproductive/Obstetrics (+) Pregnancy                             Anesthesia Physical Anesthesia Plan  ASA: II  Anesthesia Plan: Epidural   Post-op Pain Management:    Induction:   PONV Risk Score and Plan: Treatment may vary due to age or medical condition  Airway Management Planned: Natural Airway  Additional Equipment:   Intra-op Plan:   Post-operative Plan:   Informed Consent: I have reviewed the patients History and Physical, chart, labs and discussed the procedure including the risks, benefits and alternatives for the proposed anesthesia with the patient or authorized representative who has indicated his/her understanding and acceptance.       Plan Discussed with:   Anesthesia Plan Comments: (Patient with gestational thrombocytopenia, plts stable in high 70s recently (some lower counts seen outside of pregnancy as well). Has seen hematology who determined no other cause of thrombocytopenia is involved. Mild gum  bleeding on occasion with teeth brushing. TEG obtained which was normal. Discussed slightly increased risk of epidural hematoma as being concern with low platelets but reassuring labs with normal TEG and plts consistently >70k. Patient gives consent for procedure. Stephannie Peters, MD)        Anesthesia Quick Evaluation

## 2019-11-05 NOTE — Progress Notes (Signed)
Labor Progress Note Michelle Velez is a 29 y.o. G3P1011 at [redacted]w[redacted]d presented for SOL.  S:  Comfortable at this time. Drinking tea.   O:  BP (!) 138/69   Pulse 59   Temp 97.9 F (36.6 C) (Oral)   Resp 16   Ht 5\' 2"  (1.575 m)   Wt 66.8 kg   LMP 01/29/2019   SpO2 100%   BMI 26.94 kg/m    CVE: Dilation: 7 Effacement (%): 80 Cervical Position: Posterior Station: -1 Presentation: Vertex Exam by:: Dr. [email or web address]   FHT: FHR 130 bpm, moderate variabilty, + accels, - decels.  TOCO: q2-3 mins   A&P: Michelle Velez is a 29 y.o. 26 [redacted]w[redacted]d here for SOL with hx of TOLAC. #Labor: Continue expectant management. Continue titrating pitocin. AROM @ 1654 #Pain: per patient request #FWB: Category I #GBS negative  #TOLAC: due to arrest of dilation at 9 cm. ROT infant 6#11. uncomplicated LTCS. #Gestational Thrombocytopenia: per chart review from heme/onc note( 10/24/19) recommend need PLT > 70-80 K for epidural/spinal 2/2 rCS. Concern for pseudothrombocytopenia. Dr. 10/26/19 has already discussed with patient if repeat CS then the possibility of general anesthesia pending platelets. CBC AM: platelets 77 otherwise unremarkable.    Salomon Mast, DO 4:58 PM

## 2019-11-05 NOTE — Progress Notes (Signed)
Michelle Velez is a 29 y.o. G3P1011 at [redacted]w[redacted]d admitted for SOL/TOLAC  Subjective: Has been on hands and knees for 30-40 minutes  Objective: BP (!) 112/60   Pulse 72   Temp 98 F (36.7 C) (Oral)   Resp 18   Ht 5\' 2"  (1.575 m)   Wt 66.8 kg   LMP 01/29/2019   SpO2 100%   BMI 26.94 kg/m  Total I/O In: -  Out: 450 [Urine:450]  FHT:  FHR: 135 bpm, variability: moderate,  accelerations:  Present,  decelerations:  Present (early, occasional variable UC:   regular, every 2-3 minutes, MVUs 240  SVE:   Dilation: 7 Effacement (%): 80 Station: Plus 1 Exam by:: Dr. [email or web address]  Pitocin @ 7 mu/min  Labs: Lab Results  Component Value Date   WBC 8.1 11/05/2019   HGB 14.0 11/05/2019   HCT 42.6 11/05/2019   MCV 93.4 11/05/2019   PLT 77 (L) 11/05/2019    Assessment / Plan: Michelle Velez is a29 y.o.G3P1011 [redacted]w[redacted]d here for SOL with hx of TOLAC.  #Labor: S/p AROM and on pitocin since 0945. 7 cm since 1840. Benadryl given for cervical swelling- cervix much softer on exam. MVUs adequate. Fetal rotation attempted- quarter turn from ROP to ROT. Pelvis feels adequate on exam. Continue aggressive position changes in an effort to spin the infant into a more optimal position for delivery. #Pain: comfortable with epidural #FWB: Category I #GBS negative #TOLAC: due to arrest of dilation at 9 cm. ROT infant 6#11. Uncomplicated LTCS. #Gestational Thrombocytopenia: platelets 77, TEG normal. Now with pink-tinged urine. #PreEclampsia without Severe Features: P:Cr ratio 0.38, CMP unremarkable. Continue to monitor BP, no severe range pressures.Asymptomatic at this time.  Guarded for vaginal delivery, CS as appropriate  0946 DO OB Fellow, Faculty Practice 11/05/2019, 10:49 PM

## 2019-11-06 ENCOUNTER — Encounter (HOSPITAL_COMMUNITY): Payer: Self-pay | Admitting: Obstetrics and Gynecology

## 2019-11-06 ENCOUNTER — Encounter (HOSPITAL_COMMUNITY)
Admission: AD | Disposition: A | Discharge status: Home / Self Care | Payer: Self-pay | Source: Home / Self Care | Attending: Obstetrics and Gynecology

## 2019-11-06 DIAGNOSIS — O411230 Chorioamnionitis, third trimester, not applicable or unspecified: Secondary | ICD-10-CM

## 2019-11-06 DIAGNOSIS — Z3A40 40 weeks gestation of pregnancy: Secondary | ICD-10-CM

## 2019-11-06 DIAGNOSIS — O1400 Mild to moderate pre-eclampsia, unspecified trimester: Secondary | ICD-10-CM | POA: Diagnosis not present

## 2019-11-06 DIAGNOSIS — O1494 Unspecified pre-eclampsia, complicating childbirth: Secondary | ICD-10-CM

## 2019-11-06 DIAGNOSIS — O411290 Chorioamnionitis, unspecified trimester, not applicable or unspecified: Secondary | ICD-10-CM | POA: Diagnosis not present

## 2019-11-06 HISTORY — DX: Mild to moderate pre-eclampsia, unspecified trimester: O14.00

## 2019-11-06 LAB — CBC
HCT: 40.2 % (ref 36.0–46.0)
HCT: 39.2 % (ref 36.0–46.0)
Hemoglobin: 13.3 g/dL (ref 12.0–15.0)
Hemoglobin: 12.8 g/dL (ref 12.0–15.0)
MCH: 31.6 pg (ref 26.0–34.0)
MCH: 30.9 pg (ref 26.0–34.0)
MCHC: 33.1 g/dL (ref 30.0–36.0)
MCHC: 32.7 g/dL (ref 30.0–36.0)
MCV: 95.5 fL (ref 80.0–100.0)
MCV: 94.7 fL (ref 80.0–100.0)
Platelets: 70 10*3/uL — ABNORMAL LOW (ref 150–400)
Platelets: 67 10*3/uL — ABNORMAL LOW (ref 150–400)
RBC: 4.21 MIL/uL (ref 3.87–5.11)
RBC: 4.14 MIL/uL (ref 3.87–5.11)
RDW: 13.1 % (ref 11.5–15.5)
RDW: 12.9 % (ref 11.5–15.5)
WBC: 14.4 10*3/uL — ABNORMAL HIGH (ref 4.0–10.5)
WBC: 14.6 10*3/uL — ABNORMAL HIGH (ref 4.0–10.5)
nRBC: 0 % (ref 0.0–0.2)
nRBC: 0 % (ref 0.0–0.2)

## 2019-11-06 LAB — GLOBAL TEG PANEL
CFF Max Amplitude: 21 mm (ref 15–32)
CK with Heparinase (R): 4.9 min (ref 4.3–8.3)
Citrated Functional Fibrinogen: 383.2 mg/dL (ref 278–581)
Citrated Kaolin (K): 1.2 min (ref 0.8–2.1)
Citrated Kaolin (MA): 61.1 mm (ref 52–69)
Citrated Kaolin (R): 4.3 min — ABNORMAL LOW (ref 4.6–9.1)
Citrated Kaolin Angle: 75 deg (ref 63–78)
Citrated Rapid TEG (MA): 60.6 mm (ref 52–70)

## 2019-11-06 SURGERY — Surgical Case
Anesthesia: Epidural

## 2019-11-06 MED ORDER — COCONUT OIL OIL: 1.0000 "application " | TOPICAL_OIL | Status: DC | PRN

## 2019-11-06 MED ORDER — SODIUM CHLORIDE 0.9% IV SOLUTION: Freq: Once | INTRAVENOUS | Status: DC

## 2019-11-06 MED ORDER — SCOPOLAMINE 1 MG/3DAYS TD PT72
MEDICATED_PATCH | TRANSDERMAL | Status: AC
  Filled 2019-11-06: qty 1

## 2019-11-06 MED ORDER — ONDANSETRON HCL 4 MG/2ML IJ SOLN
INTRAMUSCULAR | Status: DC | PRN
  Administered 2019-11-06: 4 mg via INTRAVENOUS

## 2019-11-06 MED ORDER — ONDANSETRON HCL 4 MG/2ML IJ SOLN
INTRAMUSCULAR | Status: AC
  Filled 2019-11-06: qty 2

## 2019-11-06 MED ORDER — DIPHENHYDRAMINE HCL 25 MG PO CAPS: 25.0000 mg | ORAL_CAPSULE | Freq: Four times a day (QID) | ORAL | Status: DC | PRN

## 2019-11-06 MED ORDER — MORPHINE SULFATE (PF) 0.5 MG/ML IJ SOLN
INTRAMUSCULAR | Status: AC
  Filled 2019-11-06: qty 10

## 2019-11-06 MED ORDER — LACTATED RINGERS IV SOLN: INTRAVENOUS | Status: DC

## 2019-11-06 MED ORDER — KETOROLAC TROMETHAMINE 30 MG/ML IJ SOLN
INTRAMUSCULAR | Status: AC
  Filled 2019-11-06: qty 1

## 2019-11-06 MED ORDER — MEPERIDINE HCL 25 MG/ML IJ SOLN: 6.2500 mg | INTRAMUSCULAR | Status: DC | PRN

## 2019-11-06 MED ORDER — PRENATAL MULTIVITAMIN CH
1.0000 | ORAL_TABLET | Freq: Every day | ORAL | Status: DC
  Administered 2019-11-06: 1 via ORAL
  Filled 2019-11-06: qty 1

## 2019-11-06 MED ORDER — OXYCODONE HCL 5 MG PO TABS
5.0000 mg | ORAL_TABLET | ORAL | Status: DC | PRN
  Administered 2019-11-06: 5 mg via ORAL
  Filled 2019-11-06: qty 1

## 2019-11-06 MED ORDER — WITCH HAZEL-GLYCERIN EX PADS: 1.0000 "application " | MEDICATED_PAD | CUTANEOUS | Status: DC | PRN

## 2019-11-06 MED ORDER — EPINEPHRINE PF 1 MG/ML IJ SOLN
INTRAMUSCULAR | Status: AC
  Filled 2019-11-06: qty 1

## 2019-11-06 MED ORDER — ACETAMINOPHEN 500 MG PO TABS: 1000.0000 mg | ORAL_TABLET | Freq: Four times a day (QID) | ORAL | Status: DC

## 2019-11-06 MED ORDER — SCOPOLAMINE 1 MG/3DAYS TD PT72
1.0000 | MEDICATED_PATCH | Freq: Once | TRANSDERMAL | Status: AC
  Administered 2019-11-06: 1.5 mg via TRANSDERMAL

## 2019-11-06 MED ORDER — DIPHENHYDRAMINE HCL 50 MG/ML IJ SOLN: 12.5000 mg | INTRAMUSCULAR | Status: DC | PRN

## 2019-11-06 MED ORDER — NALBUPHINE HCL 10 MG/ML IJ SOLN: 5.0000 mg | INTRAMUSCULAR | Status: DC | PRN

## 2019-11-06 MED ORDER — TRANEXAMIC ACID-NACL 1000-0.7 MG/100ML-% IV SOLN
1000.0000 mg | INTRAVENOUS | Status: AC
  Administered 2019-11-06: 1000 mg via INTRAVENOUS

## 2019-11-06 MED ORDER — NALBUPHINE HCL 10 MG/ML IJ SOLN: 5.0000 mg | Freq: Once | INTRAMUSCULAR | Status: DC | PRN

## 2019-11-06 MED ORDER — SIMETHICONE 80 MG PO CHEW: 80.0000 mg | CHEWABLE_TABLET | ORAL | Status: DC | PRN

## 2019-11-06 MED ORDER — STERILE WATER FOR IRRIGATION IR SOLN
Status: DC | PRN
  Administered 2019-11-06: 1000 mL

## 2019-11-06 MED ORDER — ACETAMINOPHEN 10 MG/ML IV SOLN
1000.0000 mg | Freq: Once | INTRAVENOUS | Status: DC | PRN
  Administered 2019-11-06: 1000 mg via INTRAVENOUS

## 2019-11-06 MED ORDER — HYDROMORPHONE HCL 1 MG/ML IJ SOLN: 0.2500 mg | INTRAMUSCULAR | Status: DC | PRN

## 2019-11-06 MED ORDER — LACTATED RINGERS IV SOLN
INTRAVENOUS | Status: DC | PRN
Start: 2019-11-06 — End: 2019-11-06

## 2019-11-06 MED ORDER — GENTAMICIN SULFATE 40 MG/ML IJ SOLN
5.0000 mg/kg | INTRAVENOUS | Status: DC
  Administered 2019-11-06: 330 mg via INTRAVENOUS
  Filled 2019-11-06: qty 8.25

## 2019-11-06 MED ORDER — DIPHENHYDRAMINE HCL 25 MG PO CAPS: 25.0000 mg | ORAL_CAPSULE | ORAL | Status: DC | PRN

## 2019-11-06 MED ORDER — SODIUM CHLORIDE 0.9 % IV SOLN
2.0000 g | Freq: Four times a day (QID) | INTRAVENOUS | Status: DC
  Administered 2019-11-06: 2 g via INTRAVENOUS
  Filled 2019-11-06: qty 2000

## 2019-11-06 MED ORDER — TETANUS-DIPHTH-ACELL PERTUSSIS 5-2.5-18.5 LF-MCG/0.5 IM SUSP: 0.5000 mL | Freq: Once | INTRAMUSCULAR | Status: DC

## 2019-11-06 MED ORDER — DIBUCAINE (PERIANAL) 1 % EX OINT: 1.0000 "application " | TOPICAL_OINTMENT | CUTANEOUS | Status: DC | PRN

## 2019-11-06 MED ORDER — OXYTOCIN-SODIUM CHLORIDE 30-0.9 UT/500ML-% IV SOLN
2.5000 [IU]/h | INTRAVENOUS | Status: AC
  Administered 2019-11-06: 2.5 [IU]/h via INTRAVENOUS
  Filled 2019-11-06: qty 500

## 2019-11-06 MED ORDER — ONDANSETRON HCL 4 MG/2ML IJ SOLN
4.0000 mg | Freq: Three times a day (TID) | INTRAMUSCULAR | Status: DC | PRN
  Administered 2019-11-06: 4 mg via INTRAVENOUS
  Filled 2019-11-06: qty 2

## 2019-11-06 MED ORDER — LIDOCAINE HCL (PF) 2 % IJ SOLN
INTRAMUSCULAR | Status: AC
  Filled 2019-11-06: qty 10

## 2019-11-06 MED ORDER — NALOXONE HCL 4 MG/10ML IJ SOLN: 1.0000 ug/kg/h | INTRAVENOUS | Status: DC | PRN

## 2019-11-06 MED ORDER — SIMETHICONE 80 MG PO CHEW
80.0000 mg | CHEWABLE_TABLET | ORAL | Status: DC
  Filled 2019-11-06: qty 1

## 2019-11-06 MED ORDER — LIDOCAINE-EPINEPHRINE (PF) 2 %-1:200000 IJ SOLN
INTRAMUSCULAR | Status: DC | PRN
  Administered 2019-11-06: 2 mL via EPIDURAL
  Administered 2019-11-06: 7 mL via EPIDURAL
  Administered 2019-11-06: 3 mL via EPIDURAL

## 2019-11-06 MED ORDER — SODIUM CHLORIDE 0.9 % IR SOLN
Status: DC | PRN
  Administered 2019-11-06: 1

## 2019-11-06 MED ORDER — OXYTOCIN-SODIUM CHLORIDE 30-0.9 UT/500ML-% IV SOLN
INTRAVENOUS | Status: DC | PRN
  Administered 2019-11-06: 400 mL via INTRAVENOUS

## 2019-11-06 MED ORDER — SODIUM CHLORIDE 0.9 % IV SOLN
INTRAVENOUS | Status: AC
  Filled 2019-11-06: qty 500

## 2019-11-06 MED ORDER — HYDROMORPHONE HCL 1 MG/ML IJ SOLN: 0.2000 mg | INTRAMUSCULAR | Status: DC | PRN

## 2019-11-06 MED ORDER — MEPERIDINE HCL 25 MG/ML IJ SOLN
INTRAMUSCULAR | Status: AC
  Filled 2019-11-06: qty 1

## 2019-11-06 MED ORDER — ACETAMINOPHEN 500 MG PO TABS
1000.0000 mg | ORAL_TABLET | Freq: Four times a day (QID) | ORAL | Status: DC
  Administered 2019-11-06: 1000 mg via ORAL
  Filled 2019-11-06 (×5): qty 2

## 2019-11-06 MED ORDER — SODIUM CHLORIDE 0.9 % IV SOLN
2.0000 g | Freq: Four times a day (QID) | INTRAVENOUS | Status: AC
  Administered 2019-11-06 – 2019-11-07 (×4): 2 g via INTRAVENOUS
  Filled 2019-11-06 (×4): qty 2000

## 2019-11-06 MED ORDER — MEPERIDINE HCL 25 MG/ML IJ SOLN
INTRAMUSCULAR | Status: DC | PRN
  Administered 2019-11-06 (×2): 12.5 mg via INTRAVENOUS

## 2019-11-06 MED ORDER — MORPHINE SULFATE (PF) 0.5 MG/ML IJ SOLN
INTRAMUSCULAR | Status: DC | PRN
  Administered 2019-11-06: 3 mg via EPIDURAL

## 2019-11-06 MED ORDER — PROMETHAZINE HCL 25 MG/ML IJ SOLN: 6.2500 mg | INTRAMUSCULAR | Status: DC | PRN

## 2019-11-06 MED ORDER — OXYTOCIN-SODIUM CHLORIDE 30-0.9 UT/500ML-% IV SOLN
INTRAVENOUS | Status: AC
  Filled 2019-11-06: qty 500

## 2019-11-06 MED ORDER — SENNOSIDES-DOCUSATE SODIUM 8.6-50 MG PO TABS
2.0000 | ORAL_TABLET | ORAL | Status: DC
  Filled 2019-11-06: qty 2

## 2019-11-06 MED ORDER — ZOLPIDEM TARTRATE 5 MG PO TABS: 5.0000 mg | ORAL_TABLET | Freq: Every evening | ORAL | Status: DC | PRN

## 2019-11-06 MED ORDER — AMLODIPINE BESYLATE 5 MG PO TABS
5.0000 mg | ORAL_TABLET | Freq: Every day | ORAL | Status: DC
  Administered 2019-11-06: 5 mg via ORAL
  Filled 2019-11-06 (×3): qty 1

## 2019-11-06 MED ORDER — SODIUM CHLORIDE 0.9 % IV SOLN
500.0000 mg | Freq: Once | INTRAVENOUS | Status: AC
  Administered 2019-11-06: 500 mg via INTRAVENOUS

## 2019-11-06 MED ORDER — ACETAMINOPHEN 10 MG/ML IV SOLN
INTRAVENOUS | Status: AC
  Filled 2019-11-06: qty 100

## 2019-11-06 MED ORDER — NALOXONE HCL 0.4 MG/ML IJ SOLN: 0.4000 mg | INTRAMUSCULAR | Status: DC | PRN

## 2019-11-06 MED ORDER — SODIUM CHLORIDE 0.9% FLUSH
3.0000 mL | INTRAVENOUS | Status: DC | PRN
  Administered 2019-11-07: 3 mL via INTRAVENOUS

## 2019-11-06 MED ORDER — SIMETHICONE 80 MG PO CHEW
80.0000 mg | CHEWABLE_TABLET | Freq: Three times a day (TID) | ORAL | Status: DC
  Administered 2019-11-06 – 2019-11-07 (×3): 80 mg via ORAL
  Filled 2019-11-06 (×5): qty 1

## 2019-11-06 MED ORDER — MENTHOL 3 MG MT LOZG: 1.0000 | LOZENGE | OROMUCOSAL | Status: DC | PRN

## 2019-11-06 SURGICAL SUPPLY — 41 items
ADH SKN CLS APL DERMABOND .7 (GAUZE/BANDAGES/DRESSINGS) ×1
ADH SKN CLS LQ APL DERMABOND (GAUZE/BANDAGES/DRESSINGS) ×1
APL SKNCLS STERI-STRIP NONHPOA (GAUZE/BANDAGES/DRESSINGS) ×1
BENZOIN TINCTURE PRP APPL 2/3 (GAUZE/BANDAGES/DRESSINGS) ×3 IMPLANT
CLOSURE WOUND 1/2 X4 (GAUZE/BANDAGES/DRESSINGS)
CLOTH BEACON ORANGE TIMEOUT ST (SAFETY) ×3 IMPLANT
DERMABOND ADHESIVE PROPEN (GAUZE/BANDAGES/DRESSINGS) ×2
DERMABOND ADVANCED (GAUZE/BANDAGES/DRESSINGS) ×2
DERMABOND ADVANCED .7 DNX12 (GAUZE/BANDAGES/DRESSINGS) ×1 IMPLANT
DERMABOND ADVANCED .7 DNX6 (GAUZE/BANDAGES/DRESSINGS) ×1 IMPLANT
DRSG OPSITE POSTOP 4X10 (GAUZE/BANDAGES/DRESSINGS) ×3 IMPLANT
ELECT REM PT RETURN 9FT ADLT (ELECTROSURGICAL) ×3
ELECTRODE REM PT RTRN 9FT ADLT (ELECTROSURGICAL) ×1 IMPLANT
EXTRACTOR VACUUM KIWI (MISCELLANEOUS) IMPLANT
GAUZE SPONGE 4X4 12PLY STRL LF (GAUZE/BANDAGES/DRESSINGS) ×6 IMPLANT
GLOVE BIOGEL PI IND STRL 7.0 (GLOVE) ×1 IMPLANT
GLOVE BIOGEL PI INDICATOR 7.0 (GLOVE) ×2
GLOVE SURG ORTHO 8.0 STRL STRW (GLOVE) ×3 IMPLANT
GOWN STRL REUS W/TWL LRG LVL3 (GOWN DISPOSABLE) ×6 IMPLANT
HEMOSTAT ARISTA ABSORB 3G PWDR (HEMOSTASIS) ×3 IMPLANT
KIT ABG SYR 3ML LUER SLIP (SYRINGE) IMPLANT
NEEDLE HYPO 25X5/8 SAFETYGLIDE (NEEDLE) IMPLANT
NS IRRIG 1000ML POUR BTL (IV SOLUTION) ×3 IMPLANT
PACK C SECTION WH (CUSTOM PROCEDURE TRAY) ×3 IMPLANT
PAD ABD 7.5X8 STRL (GAUZE/BANDAGES/DRESSINGS) ×3 IMPLANT
PAD OB MATERNITY 4.3X12.25 (PERSONAL CARE ITEMS) ×3 IMPLANT
PENCIL SMOKE EVAC W/HOLSTER (ELECTROSURGICAL) ×3 IMPLANT
RTRCTR C-SECT PINK 25CM LRG (MISCELLANEOUS) IMPLANT
SPONGE GAUZE 4X4 12PLY STER LF (GAUZE/BANDAGES/DRESSINGS) ×6 IMPLANT
STRIP CLOSURE SKIN 1/2X4 (GAUZE/BANDAGES/DRESSINGS) IMPLANT
SUT CHROMIC 1 CTX 36 (SUTURE) ×6 IMPLANT
SUT MNCRL AB 3-0 PS2 27 (SUTURE) ×3 IMPLANT
SUT PLAIN 0 NONE (SUTURE) IMPLANT
SUT VIC AB 0 CT1 27 (SUTURE) ×6
SUT VIC AB 0 CT1 27XBRD ANBCTR (SUTURE) ×2 IMPLANT
SUT VIC AB 2-0 CT1 27 (SUTURE) ×3
SUT VIC AB 2-0 CT1 TAPERPNT 27 (SUTURE) ×1 IMPLANT
SUT VIC AB 4-0 KS 27 (SUTURE) ×3 IMPLANT
TOWEL OR 17X24 6PK STRL BLUE (TOWEL DISPOSABLE) ×3 IMPLANT
TRAY FOLEY W/BAG SLVR 14FR LF (SET/KITS/TRAYS/PACK) ×3 IMPLANT
WATER STERILE IRR 1000ML POUR (IV SOLUTION) ×3 IMPLANT

## 2019-11-06 NOTE — Progress Notes (Addendum)
Still 8 cm on SVE despite adequate MVUs.  Now with maternal fever of 100.7*F, and fetal tachycardia ~160 bpm.  Due to arrest of dilation, and new onset chorioamnionitis, we discussed options of delivery. The patient elected to proceed with a repeat Cesarean delivery.  The risks of cesarean section were discussed with the patient including but were not limited to: bleeding which may require transfusion or reoperation; infection which may require antibiotics; injury to bowel, bladder, ureters or other surrounding organs; injury to the fetus; need for additional procedures including hysterectomy in the event of a life-threatening hemorrhage; placental abnormalities wth subsequent pregnancies, incisional problems, thromboembolic phenomenon and other postoperative/anesthesia complications. The patient concurred with the proposed plan, giving informed written consent for the procedures.  Patient has been NPO since this afternoon she will remain NPO for procedure. Anesthesia and OR aware.  Preoperative prophylactic antibiotics and SCDs ordered on call to the OR.  To OR when ready.   Marlowe Alt, DO OB Fellow, Faculty Practice 11/06/2019 1:37 AM

## 2019-11-06 NOTE — Anesthesia Postprocedure Evaluation (Signed)
Anesthesia Post Note  Patient: Michelle Velez  Procedure(s) Performed: CESAREAN SECTION     Anesthesia Type: Epidural Level of consciousness: awake and alert and oriented Pain management: pain level controlled Vital Signs Assessment: post-procedure vital signs reviewed and stable Respiratory status: spontaneous breathing, nonlabored ventilation and respiratory function stable Cardiovascular status: blood pressure returned to baseline and stable Postop Assessment: no headache, no backache, patient able to bend at knees, no apparent nausea or vomiting, adequate PO intake and able to ambulate Anesthetic complications: no   No complications documented.  Last Vitals:  Vitals:   11/06/19 0857 11/06/19 1030  BP: 126/83 (!) 129/79  Pulse: 72 76  Resp: 18 20  Temp:  37.2 C  SpO2: 100% 97%    Last Pain:  Vitals:   11/06/19 1410  TempSrc:   PainSc: 6    Pain Goal:                   Palmyra Rogacki A.

## 2019-11-06 NOTE — Discharge Instructions (Signed)

## 2019-11-06 NOTE — Progress Notes (Signed)
Michelle Velez is a 29 y.o. G3P1011 at [redacted]w[redacted]d admitted for SOL/TOLAC  Subjective: Comfortable with epidural  Objective: BP 127/80   Pulse 88   Temp 99.5 F (37.5 C) (Oral)   Resp 18   Ht 5\' 2"  (1.575 m)   Wt 66.8 kg   LMP 01/29/2019   SpO2 100%   BMI 26.94 kg/m  Total I/O In: -  Out: 500 [Urine:500]  FHT:  FHR: 150 bpm, variability: moderate,  accelerations:  Present,  decelerations:  Present occasional variable/early UC:   regular, every 2-3 minutes, MVUs >200  SVE:   Dilation: 8 Effacement (%): 90 Station: Plus 1 Exam by:: Dr. [email or web address]  Pitocin @ 10 mu/min  Labs: Lab Results  Component Value Date   WBC 8.1 11/05/2019   HGB 14.0 11/05/2019   HCT 42.6 11/05/2019   MCV 93.4 11/05/2019   PLT 77 (L) 11/05/2019    Assessment / Plan: Michelle Velez is a29 y.o.G3P1011 [redacted]w[redacted]d here for SOL with hx of TOLAC.  #Labor:S/p AROM and on pitocin since 0945 on 11/06/19, now at 10. MVUs adequate. Continue aggressive position changes and pitocin titration. #Pain:comfortable with epidural #FWB: Category I #GBS negative #TOLAC: due to arrest of dilation at 9 cm. ROT infant 6#11.Uncomplicated LTCS. #Gestational Thrombocytopenia: platelets 77, TEG normal. Now with pink-tinged urine. #PreEclampsiawithout Severe Features:P:Crratio 0.38, CMP unremarkable. Continue to monitor BP, no severe range pressures.Asymptomatic at this time.  Hopeful for vaginal delivery, CS as appropriate  11/08/19 DO OB Fellow, Faculty Practice 11/06/2019, 12:39 AM

## 2019-11-06 NOTE — BH Specialist Note (Signed)
Integrated Behavioral Health via Telemedicine Phone Visit  11/06/2019 Michelle Velez [id number]  Number of Integrated Behavioral Health visits: 6 Session Start time: 10:53  Session End time: 11:25 Total time: 32  Referring Provider: Candelaria Celeste, DO Type of Visit: Phone Patient/Family location: Home [facility] Provider location: Center for [facility]  All persons participating in visit: Patient Michelle Velez and [facility] Charleen Madera    Confirmed patient's address: Yes  Confirmed patient's phone number: Yes  Any changes to demographics: No   Confirmed patient's insurance: Yes  Any changes to patient's insurance: No   Discussed confidentiality: at previous visit  I connected with Michelle Velez by a video enabled telemedicine application ([employer]) and verified that I am speaking with the correct person using two identifiers.     I discussed the limitations of evaluation and management by telemedicine and the availability of in person appointments.  I discussed that the purpose of this visit is to provide behavioral health care while limiting exposure to the novel coronavirus.   Discussed there is a possibility of technology failure and discussed alternative modes of communication if that failure occurs.  I discussed that engaging in this virtual visit, they consent to the provision of behavioral healthcare and the services will be billed under their insurance.  Patient and/or legal guardian expressed understanding and consented to virtual visit: Yes   PRESENTING CONCERNS: Patient and/or family reports the following symptoms/concerns: Pt states her primary concern today is lack of quality sleep and worry about baby not latching on well, though he is gaining weight; is sleeping when baby sleeps; also concerned about blood in stool after cesarean.  Duration of problem: Postpartum; Severity of problem: mild  STRENGTHS (Protective Factors/Coping  Skills): Supportive family, uses self-coping strategies as needed  GOALS ADDRESSED: Patient will: 1.  Reduce symptoms of: anxiety and stress  2.  Increase knowledge and/or ability of: healthy habits and stress reduction  3.  Demonstrate ability to: Increase healthy adjustment to current life circumstances and Increase adequate support systems for patient/family  INTERVENTIONS: Interventions utilized:  Solution-Focused Strategies, Psychoeducation and/or Health Education and Link to [employer] Standardized Assessments completed: GAD-7 and PHQ 9  ASSESSMENT: Patient currently experiencing Anxiety disorder, unspecified.   Patient may benefit from continued psychoeducation and brief therapeutic interventions regarding coping with symptoms of anxiety and current life stress .  PLAN: 1. Follow up with behavioral health clinician on : Two weeks 2. Behavioral recommendations:  -Accept referral to Lactation Consultant for Thursday, 11/23/19 appointment at 8:15am in-person at [facility] -Expect call-back from clinical staff about medical concern -Continue sleeping when baby sleeps (including morning nap times) as much as able throughout the day  -Consider registering for and joining virtual new mom support group at either [email or web address] or www.[email or web address].net (when daughter goes to school in two weeks) 3. Referral(s): Integrated Art gallery manager (In Clinic) and [employer]:  New mom support  I discussed the assessment and treatment plan with the patient and/or parent/guardian. They were provided an opportunity to ask questions and all were answered. They agreed with the plan and demonstrated an understanding of the instructions.   They were advised to call back or seek an in-person evaluation if the symptoms worsen or if the condition fails to improve as anticipated.  [name]  Depression screen [facility] 2/9 11/20/2019 11/13/2019 11/02/2019 10/26/2019  10/12/2019  Decreased Interest 0 0 1 1 1   Down, Depressed, Hopeless 0 0 0 0 0  PHQ - 2 Score 0 0 1 1 1   Altered sleeping 1 1 1 1 1   Tired, decreased energy 1 1 1 1 1   Change in appetite 0 0 0 0 1  Feeling bad or failure about yourself  0 0 0 0 0  Trouble concentrating 0 0 0 0 0  Moving slowly or fidgety/restless 3 0 0 0 0  Suicidal thoughts 0 0 0 0 0  PHQ-9 Score 5 2 3 3 4   Some recent data might be hidden   GAD 7 : Generalized Anxiety Score 11/20/2019 11/13/2019 11/02/2019 10/26/2019  Nervous, Anxious, on Edge 1 1 1 1   Control/stop worrying 1 1 1 1   Worry too much - different things 1 1 1 1   Trouble relaxing 0 0 0 0  Restless 0 0 0 0  Easily annoyed or irritable 1 0 1 1  Afraid - awful might happen 0 0 0 0  Total GAD 7 Score 4 3 4  4

## 2019-11-06 NOTE — Addendum Note (Signed)
Addendum  created 11/06/19 1727 by Renford Dills, CRNA   Clinical Note Signed

## 2019-11-06 NOTE — Discharge Summary (Signed)
Postpartum Discharge Summary    Patient Name: Michelle Velez DOB: 05-02-1990 MRN: [id number]  Date of admission: 11/05/2019 Delivery date:11/06/2019  Delivering provider: Merilyn Baba  Date of discharge: 11/10/2019  Admitting diagnosis: Desires VBAC (vaginal birth after cesarean) trial [O34.219] Intrauterine pregnancy: [redacted]w[redacted]d    Secondary diagnosis:  Principal Problem:   History of cesarean delivery Active Problems:   [place]ocyt[place]   Desires VBAC (vaginal birth after cesarean) trial   Chorioamnionitis, delivered, current hospitalization   Mild preeclampsia   [place]  Additional problems: as noted above   Discharge diagnosis: Term Pregnancy Delivered and Preeclampsia (mild)            Post partum procedures: NG tube placement given post-op ileus Augmentation: AROM and Pitocin Complications: Intrauterine Inflammation or infection (Chorioamniotis)  Hospital course: Onset of Labor With Unplanned C/S   29y.o. yo G3P1011 at 437w1das admitted in Latent Labor on 11/05/2019. Patient had a labor course significant for augmentation with pitocin and AROM. She developed mild preeclampsia, and eventually a fever of 100.7*F- concerning for Triple I. The patient went for cesarean section due to Elective Repeat, Arrest of Dilation and Arrest of Descent. Delivery details as follows: Membrane Rupture Time/Date: 4:54 PM ,11/05/2019   Delivery Method:C-Section, Low Transverse  Details of operation can be found in separate operative note. Postpartum course complicated by nausea/vomiting requiring additional medication, as well as postoperative ileus. General Surgery was consulted with recommendation for NG tube placement. S/p improvement in symptoms NG tube was removed on 7/29. Pt had multiple bowel movements prior to discharge and was tolerating po intake well without nausea or vomiting. On day of discharge she was  ambulating,tolerating a regular diet, passing flatus, and urinating well.  Patient is discharged home in stable condition 11/10/19. Prescribed amlodipine 7m64maily on discharge given persistent mild range BPs in postpartum period.  Newborn Data: Birth date:11/06/2019  Birth time:2:25 AM  Gender:Female  Living status:Living  Apgars:8 ,9  Weight:3617 g   Magnesium Sulfate received: No BMZ received: No Rhophylac:N/A MMR:N/A T-DaP:Given prenatally Flu: N/A Transfusion:No  Physical exam  Vitals:   11/09/19 1200 11/09/19 1424 11/09/19 2145 11/10/19 0507  BP: (!) 138/75 127/76 128/81 (!) 144/89  Pulse:  75 54 55  Resp:  18 16 16   Temp:  98.6 F (37 C) 97.7 F (36.5 C) 97.7 F (36.5 C)  TempSrc:  Oral Oral Oral  SpO2:  100% 100%   Weight:      Height:       General: alert, cooperative and no distress Lochia: appropriate Uterine Fundus: firm Incision: Healing well with no significant drainage, No significant erythema, Dressing is clean, dry, and intact DVT Evaluation: No evidence of DVT seen on physical exam. Labs: Lab Results  Component Value Date   WBC 10.7 (H) 11/09/2019   HGB 12.1 11/09/2019   HCT 36.1 11/09/2019   MCV 95.0 11/09/2019   PLT 103 (L) 11/09/2019   CMP Latest Ref Rng & Units 11/10/2019  Glucose 70 - 99 mg/dL -  BUN 6 - 20 mg/dL -  Creatinine 0.44 - 1.00 mg/dL -  Sodium 135 - 145 mmol/L -  Potassium 3.5 - 5.1 mmol/L 4.0  Chloride 98 - 111 mmol/L -  CO2 22 - 32 mmol/L -  Calcium 8.9 - 10.3 mg/dL -  Total Protein 6.5 - 8.1 g/dL -  Total Bilirubin 0.3 - 1.2 mg/dL -  Alkaline Phos 38 - 126 U/L -  AST 15 - 41 U/L -  ALT 0 - 44 U/L -   Edinburgh Score: Edinburgh Postnatal Depression Scale Screening Tool 11/09/2019  I have been able to laugh and see the funny side of things. 1  I have looked forward with enjoyment to things. 0  I have blamed myself unnecessarily when things went wrong. 2  I have been anxious or worried for no good reason. 2  I have felt  scared or panicky for no good reason. 2  Things have been getting on top of me. 2  I have been so unhappy that I have had difficulty sleeping. 1  I have felt sad or miserable. 2  I have been so unhappy that I have been crying. 1  The thought of harming myself has occurred to me. 0  Edinburgh Postnatal Depression Scale Total 13     After visit meds:  Allergies as of 11/10/2019   No Known Allergies     Medication List    TAKE these medications   acetaminophen 500 MG tablet Commonly known as: TYLENOL Take 2 tablets (1,000 mg total) by mouth every 8 (eight) hours.   amLODipine 5 MG tablet Commonly known as: NORVASC Take 1 tablet (5 mg total) by mouth daily. Start taking on: November 11, 2019   Breast Pump Misc Electric double pump - Breast pump for client to use while breastfeeding.   coconut oil Oil Apply 1 application topically as needed.   ibuprofen 600 MG tablet Commonly known as: ADVIL Take 1 tablet (600 mg total) by mouth every 6 (six) hours.   multivitamin-prenatal 27-0.8 MG Tabs tablet Take 1 tablet by mouth daily at 12 noon.   oxyCODONE 5 MG immediate release tablet Commonly known as: Oxy IR/ROXICODONE Take 1 tablet (5 mg total) by mouth every 6 (six) hours as needed for up to 3 days for severe pain or breakthrough pain.   VICKS VAPORUB EX Apply 1 application topically as needed (nose/congestion).        Discharge home in stable condition Infant Feeding: Both Infant Disposition:home with mother Discharge instruction: per After Visit Summary and Postpartum booklet. Activity: Advance as tolerated. Pelvic rest for 6 weeks.  Diet: routine diet Future Appointments: Future Appointments  Date Time Provider [place]  11/13/2019 10:20 AM [facility] NURSE [facility]  11/20/2019 10:45 AM [facility]  12/07/2019  9:15 AM Gavin Pound, CNM [facility]   Follow up Visit:  Please schedule this patient for a In person postpartum  visit in 4 weeks with the following provider: Any provider. Additional Postpartum F/U:Incision check 1 week and BP check 1 week  High risk pregnancy complicated by:  h/o prior CS, gestational thrombocytopenia Delivery mode:  C-Section, Low Transverse  Anticipated Birth Control:  Unsure   11/10/2019 Randa Ngo, MD  OB Fellow

## 2019-11-06 NOTE — Op Note (Addendum)
Operative Note   SURGERY DATE: 11/06/2019  PRE-OP DIAGNOSIS:  *Pregnancy at 40 weeks *Failed TOLAC *Arrest of dilation/descent *Chorioamnionitis *Preeclampsia without severe features *Elective repeat Cesarean delivery  POST-OP DIAGNOSIS:  *Pregnancy at 40 weeks *Failed TOLAC *Arrest of dilation/descent *Chorioamnionitis *Preeclampsia without severe features *Cesarean delivery, delivered  PROCEDURE: repeat low transverse cesarean section via pfannenstiel skin incision with double layer uterine closure, lysis of adhesision  SURGEON: Surgeon(s) and Role:    * Donavan Foil, Regis Bill, MD - Primary    * Sparacino, Hailey L, DO - Fellow  ASSISTANT: None  ANESTHESIA: epidural  ESTIMATED BLOOD LOSS: 256 mL  DRAINS: 300 mL UOP via indwelling foley- blood tinged (clearer than prior to surgery)  TOTAL IV FLUIDS: 2050 mL crystalloid  VTE PROPHYLAXIS: SCDs to bilateral lower extremities  ANTIBIOTICS: 2 g Ampicillin, 330 mg Gentamycin, 500 mg Azithromycin, within 1 hour of skin incision  MEDICATIONS: 1g TXA given at time of hysterotomy  SPECIMENS: Placenta to pathology  COMPLICATIONS: None immediate  INDICATIONS: Still 8 cm on SVE despite adequate MVUs. Now with maternal fever of 100.7*F, and fetal tachycardia ~160 bpm. Due to arrest of dilation, and new onset chorioamnionitis, we discussed options of delivery. The patient elected to proceed with a repeat Cesarean delivery. The risks of cesarean section were discussed with the patient including but were not limited to: bleeding which may require transfusion or reoperation; infection which may require antibiotics; injury to bowel, bladder, ureters or other surrounding organs; injury to the fetus; need for additional procedures including hysterectomy in the event of a life-threatening hemorrhage; placental abnormalities wth subsequent pregnancies, incisional problems, thromboembolic phenomenon and other postoperative/anesthesia complications.  The patient concurred with the proposed plan, giving informed written consent for the procedures.  Patient has been NPO since this afternoon she will remain NPO for procedure. Anesthesia and OR aware.  Preoperative prophylactic antibiotics and SCDs ordered on call to the OR.  To OR when ready  FINDINGS: Mild intra-abdominal adhesions were noted- with some adhesions of the uterus to the rectus, bladder adhered up cephalad onto the lower uterine segment. Grossly normal uterus, tubes and ovaries. Meconium-stained amniotic fluid, cephalic ROT female infant, weight per medical record, APGARs 8/9, intact placenta.  PROCEDURE IN DETAIL: The patient was taken to the operating room where her epidural anesthesia was dosed up and normal fetal heart tones were confirmed. She was then prepped and draped in the normal fashion in the dorsal supine position with a leftward tilt.  After a time out was performed, a pfannensteil skin incision was made with the scalpel and carried through to the underlying layer of fascia. The fascia was then incised at the midline and this incision was extended laterally with the mayo scissors. Attention was turned to the superior aspect of the fascial incision which was grasped with the kocher clamps x 2, tented up and the rectus muscles were dissected off bluntly and sharply. In a similar fashion the inferior aspect of the fascial incision was grasped with the kocher clamps, tented up and the rectus muscles dissected off with the mayo scissors. The rectus muscles were then separated in the midline and the peritoneum was entered bluntly. The bladder blade was inserted and the vesicouterine peritoneum was identified, tented up and entered with the metzenbaum scissors. This incision was extended laterally and the bladder flap was created digitally. The bladder blade was reinserted.  A low transverse hysterotomy was made with the scalpel until the endometrial cavity was breached and the amniotic sac  ruptured, yielding clear fluid amniotic fluid. This incision was extended bluntly and the infant's head, shoulders and body were delivered atraumatically.The cord was clamped x 2 and cut, and the infant was handed to the awaiting pediatricians, after delayed cord clamping was done.  The placenta was then gradually expressed from the uterus, with trailing membranes, and then the uterus was cleared of all clots and debris. The hysterotomy was repaired with a running suture of 0 Chromic Gut. A second imbricating layer of 0 Chromic Gut suture was then placed. Arista was added to achieve excellent hemostasis.   The hysterotomy and all operative sites were reinspected and excellent hemostasis was noted.  The peritoneum was closed with a running stitch of 0 Chromic Gut. The fascia was reapproximated with 0 Vicryl in a simple running fashion bilaterally. The skin was then closed with 4-0 Vicryl, in a subcuticular fashion.  The patient  tolerated the procedure well. Sponge, lap, needle, and instrument counts were correct x 2. The patient was transferred to the recovery room awake, alert and breathing independently in stable condition.  Marlowe Alt, DO OB Fellow Center for [facility])   Attestation of Attending Supervision of Obstetric Fellow: Evaluation and management procedures were performed by the Obstetric Fellow under my supervision and collaboration.  I have reviewed the Obstetric Fellow's note and chart, and I agree with the management and plan. I have also made any necessary editorial changes.   Warden Fillers, MD Attending Obstetrician & Gynecologist, [facility] for [facility], [facility] 11/06/2019 2:24 PM

## 2019-11-06 NOTE — Progress Notes (Signed)
Called and got an order for a binder, per patient's request.

## 2019-11-06 NOTE — Consult Note (Signed)
Delivery Note:  C-section       11/06/2019  2:39 AM  I was called to the operating room at the request of the patient's obstetrician (Dr. Bass) for a repeat c-section in the setting of failed TOLAC and chorioamnionitis.  PRENATAL HX:  This is a 29 y/o G3P1011 at 40 and 1/[redacted] weeks gestation who was admitted for TOLAC.  Her pregnancy has been complicated by gestational thrombocytopenia.  Delivery by c-section for failure to progress and the development of chorioamnionitis.  AROM x10h, GBS negative.    DELIVERY:  Infant was vigorous at delivery, requiring no resuscitation other than standard warming, drying and stimulation.  A pulse oximeter was applied and O2 saturations were in mid 80s by 5 minutes and low 90s by 10 minutes.  APGARs 8 and 9.  Exam notable for molding, otherwise was within normal limits.  After 10 minutes, baby left with nurse to assist parents with skin-to-skin care.   _____________________ Electronically Signed By: Sayge Brienza, MD Neonatologist 

## 2019-11-06 NOTE — Lactation Note (Signed)
This note was copied from a baby's chart. Lactation Consultation Note  Patient Name: Michelle Velez QASTM'H Date: 11/06/2019  [name] now 54 hours old.  RN reports mom would like assistance with breastfeeding.  Mom sleeping on arrival.  Very difficult time arousing mom to try and assist with lactation.  After a few attempts mom woke up but report she is too uncomfortable to move. After a few attempts with latching and infant starting to get fussy, LC spoon feed him 10 ml of colostrum with moms permission.  Mom unable to stay awake to hand express but gave [facility] permission to do so.   Mom repots her first baby never latched and she pumped with DEBP for that baby.  Mom reports she does not have a DEBP with this baby yet that she is having a hard time with her insurance company.  Infant got hiccups.  LC attempted to burp him.  He would not burp.  LC reswaddled infant and put him in crib next to mom and relocked crib.  Urged mom to feed on cue and 8-12 times day.  Mom will probably need complete reteaching.  Keeps falling asleep while talking with her.  Urged to call lactation as needed.Discussed hand expression and spoon feeding at next feeding if mom cant get comfortable to breastfeed him.  Maternal Data    Feeding    LATCH Score                   Interventions    Lactation Tools Discussed/Used     Consult Status      Michelle Velez 11/06/2019, 8:54 PM

## 2019-11-06 NOTE — Transfer of Care (Signed)
Immediate Anesthesia Transfer of Care Note  Patient: Michelle Velez  Procedure(s) Performed: CESAREAN SECTION  Patient Location: PACU  Anesthesia Type:Epidural  Level of Consciousness: awake, alert  and patient cooperative  Airway & Oxygen Therapy: Patient Spontanous Breathing  Post-op Assessment: Report given to RN and Post -op Vital signs reviewed and stable  Post vital signs: Reviewed and stable  Last Vitals:  Vitals Value Taken Time  BP 135/83 11/06/19 0325  Temp    Pulse 85 11/06/19 0329  Resp 21 11/06/19 0329  SpO2 100 % 11/06/19 0329  Vitals shown include unvalidated device data.  Last Pain:  Vitals:   11/06/19 0119  TempSrc: Axillary  PainSc:          Complications: No complications documented.

## 2019-11-06 NOTE — Anesthesia Postprocedure Evaluation (Signed)
Anesthesia Post Note  Patient: Michelle Velez  Procedure(s) Performed: CESAREAN SECTION     Patient location during evaluation: Mother Baby Anesthesia Type: Epidural Level of consciousness: awake and alert Pain management: pain level controlled Vital Signs Assessment: post-procedure vital signs reviewed and stable Respiratory status: spontaneous breathing, nonlabored ventilation and respiratory function stable Cardiovascular status: stable Postop Assessment: no headache, no backache and epidural receding Anesthetic complications: no   No complications documented.  Last Vitals:  Vitals:   11/06/19 1030 11/06/19 1450  BP: (!) 129/79 (!) 130/83  Pulse: 76 76  Resp: 20 18  Temp: 37.2 C 36.9 C  SpO2: 97%     Last Pain:  Vitals:   11/06/19 1630  TempSrc:   PainSc: Asleep   Pain Goal:                   Elanda Garmany

## 2019-11-07 LAB — BPAM PLATELET PHERESIS
Blood Product Expiration Date: 202107272359
Unit Type and Rh: 7300

## 2019-11-07 LAB — SURGICAL PATHOLOGY

## 2019-11-07 LAB — PREPARE PLATELET PHERESIS: Unit division: 0

## 2019-11-07 MED ORDER — SUCRALFATE 1 G PO TABS
1.0000 g | ORAL_TABLET | Freq: Three times a day (TID) | ORAL | Status: DC
  Filled 2019-11-07 (×8): qty 1

## 2019-11-07 MED ORDER — LACTATED RINGERS IV BOLUS: 500.0000 mL | Freq: Once | INTRAVENOUS | Status: DC

## 2019-11-07 MED ORDER — PROMETHAZINE HCL 25 MG/ML IJ SOLN
12.5000 mg | Freq: Four times a day (QID) | INTRAMUSCULAR | Status: DC | PRN
  Administered 2019-11-07: 12.5 mg via INTRAVENOUS
  Filled 2019-11-07 (×2): qty 1

## 2019-11-07 MED ORDER — GABAPENTIN 600 MG PO TABS
300.0000 mg | ORAL_TABLET | Freq: Three times a day (TID) | ORAL | Status: DC
  Filled 2019-11-07: qty 0.5

## 2019-11-07 MED ORDER — LACTATED RINGERS IV BOLUS
500.0000 mL | Freq: Once | INTRAVENOUS | Status: AC
  Administered 2019-11-07: 500 mL via INTRAVENOUS

## 2019-11-07 MED ORDER — ONDANSETRON HCL 4 MG/2ML IJ SOLN
4.0000 mg | Freq: Four times a day (QID) | INTRAMUSCULAR | Status: DC | PRN
  Administered 2019-11-07 – 2019-11-09 (×2): 4 mg via INTRAVENOUS
  Filled 2019-11-07 (×2): qty 2

## 2019-11-07 MED ORDER — PANTOPRAZOLE SODIUM 40 MG PO TBEC
40.0000 mg | DELAYED_RELEASE_TABLET | Freq: Two times a day (BID) | ORAL | Status: DC
  Administered 2019-11-07: 40 mg via ORAL
  Filled 2019-11-07 (×3): qty 1

## 2019-11-07 MED ORDER — PROMETHAZINE HCL 25 MG PO TABS: 12.5000 mg | ORAL_TABLET | Freq: Four times a day (QID) | ORAL | Status: DC | PRN

## 2019-11-07 MED ORDER — PANTOPRAZOLE SODIUM 40 MG IV SOLR
40.0000 mg | Freq: Once | INTRAVENOUS | Status: AC
  Administered 2019-11-07: 40 mg via INTRAVENOUS
  Filled 2019-11-07: qty 40

## 2019-11-07 MED ORDER — GABAPENTIN 100 MG PO CAPS
100.0000 mg | ORAL_CAPSULE | Freq: Three times a day (TID) | ORAL | Status: DC
  Administered 2019-11-07: 100 mg via ORAL
  Filled 2019-11-07 (×2): qty 1

## 2019-11-07 NOTE — Progress Notes (Signed)
Pt vomiting, Pt states she has not been able to keep down food.  PO medication held at this time. Casper Harrison MD notified. Orders received.

## 2019-11-07 NOTE — Progress Notes (Signed)
Pt continuing vomiting. Dr Casper Harrison notified.

## 2019-11-07 NOTE — Progress Notes (Signed)
Pt vomiting after drinking smoothie. Dr Casper Harrison notified. IV restarted by Ophelia Charter RN in right wrist. LR 500 cc bolus started and IV Zofran given per MD order. Will continue to monitor

## 2019-11-07 NOTE — Progress Notes (Signed)
Teaching Services notified of patient's complaint of feeling like her upper chest was burning. Patient states that she vomitted twice, with bile colored emesis with other times being dry heaving and salvia. Patient has not ate or drink since the Zofran earlier in shift. Encouraged patient to drink water and eat some saltine crackers. Repositioned her in bed. Active bowel sounds in all 4 quadrants. New order received.

## 2019-11-07 NOTE — Progress Notes (Signed)
Pt still nauseaed. Pt does not want IV restarted. Pt states she wants to try to get a smoothie and see if that helps. Casper Harrison MD aware.

## 2019-11-07 NOTE — Progress Notes (Signed)
Phenergan 12.5 mg  IV given. LR bolus started. IV infiltrated with bolus. Casper Harrison MD notified. States leave IV out for now and monitor pt.

## 2019-11-07 NOTE — Progress Notes (Addendum)
Subjective: Postpartum Day 1: s/p Repeat Cesarean Delivery 2/2 failed TOLAC/AoD. Chorio (received Amp/Gent x 24 h). Patient reports not feeling well. She states that she does not have an appetite and has not been able to hold much down. She is trying to make herself eat, but it is not helping her nausea and vomiting. She reports that she has vomited multiple times and the scopolamine patch is not helping. Overnight she was given one dose of IV Protonix due to her chest burning sensation which did not seem to help her either. She also reports abdominal discomfort that she describe more like a pressure. Denies any chest sharp pain, SOB and Calf pain. She has been up to void. She is breast feeding without difficulty. She would like to use barrier protection for contraceptions.   Objective: Vital signs in last 24 hours: Temp:  [97.9 F (36.6 C)-99 F (37.2 C)] 97.9 F (36.6 C) (07/26 2112) Pulse Rate:  [68-82] 72 (07/26 2112) Resp:  [17-20] 18 (07/26 2112) BP: (104-146)/(69-85) 127/80 (07/26 2112) SpO2:  [97 %-100 %] 100 % (07/26 2112)  Physical Exam:  General: alert, cooperative, appears stated age and moderate distress Lochia: appropriate Uterine Fundus: firm Incision: dressing dry, clean, and intact DVT Evaluation: No evidence of DVT seen on physical exam. Negative Homan's sign. No cords or calf tenderness. No significant calf/ankle edema.  Recent Labs    11/06/19 0427 11/06/19 0719  HGB 13.3 12.8  HCT 40.2 39.2    Assessment/Plan: Status post Cesarean section. Postoperative course complicated by significant nausea and vomiting  . Will optimize medications with IV phenergan and Carafate, as well as scheduled protonix. Patient has few moderate range blood pressure which can be attributed to mild PreE vs secondary to pain and discomfort from significant nausea/vomiting. Will continue to monitor vitals as well as Norvasc 5 mg daily. Patient has remained afebrile.   Phill Myron,  DO 11/07/2019, 6:40 AM  GME ATTESTATION:  I saw and evaluated the patient. I agree with the findings and the plan of care as documented in the resident's note:  Patient with significant nausea and vomiting. Unable to use motrin for pain 2/2 low platelets and worsening burning substernal pain (especially when laying flat). Will try to optimize N/V medications- phenergan/scope patch/zofran, as well as reflux medications- carafate/protonix.Have signed out to day-team to keep an eye on her. Will also try gabapentin for pain.  Marlowe Alt, DO OB Fellow, Faculty [facility], Center for [facility] 11/07/2019 7:24 AM

## 2019-11-08 ENCOUNTER — Inpatient Hospital Stay (HOSPITAL_COMMUNITY): Payer: 59

## 2019-11-08 ENCOUNTER — Other Ambulatory Visit (HOSPITAL_COMMUNITY): Payer: PRIVATE HEALTH INSURANCE

## 2019-11-08 ENCOUNTER — Encounter (HOSPITAL_COMMUNITY): Payer: Self-pay | Admitting: Obstetrics and Gynecology

## 2019-11-08 DIAGNOSIS — K9189 Other postprocedural complications and disorders of digestive system: Secondary | ICD-10-CM

## 2019-11-08 DIAGNOSIS — K567 Ileus, unspecified: Secondary | ICD-10-CM | POA: Diagnosis not present

## 2019-11-08 HISTORY — DX: Ileus, unspecified: K56.7

## 2019-11-08 HISTORY — DX: Other postprocedural complications and disorders of digestive system: K91.89

## 2019-11-08 LAB — BASIC METABOLIC PANEL
Anion gap: 10 (ref 5–15)
BUN: 16 mg/dL (ref 6–20)
CO2: 25 mmol/L (ref 22–32)
Calcium: 8.3 mg/dL — ABNORMAL LOW (ref 8.9–10.3)
Chloride: 104 mmol/L (ref 98–111)
Creatinine, Ser: 0.64 mg/dL (ref 0.44–1.00)
GFR calc Af Amer: >60 mL/min (ref >60)
GFR calc non Af Amer: >60 mL/min (ref >60)
Glucose, Bld: 82 mg/dL (ref 70–99)
Potassium: 3.6 mmol/L (ref 3.5–5.1)
Sodium: 139 mmol/L (ref 135–145)

## 2019-11-08 LAB — CBC
HCT: 36.7 % (ref 36.0–46.0)
Hemoglobin: 12.2 g/dL (ref 12.0–15.0)
MCH: 31 pg (ref 26.0–34.0)
MCHC: 33.2 g/dL (ref 30.0–36.0)
MCV: 93.1 fL (ref 80.0–100.0)
Platelets: 84 10*3/uL — ABNORMAL LOW (ref 150–400)
RBC: 3.94 MIL/uL (ref 3.87–5.11)
RDW: 12.7 % (ref 11.5–15.5)
WBC: 14.2 10*3/uL — ABNORMAL HIGH (ref 4.0–10.5)
nRBC: 0 % (ref 0.0–0.2)

## 2019-11-08 LAB — MAGNESIUM: Magnesium: 1.7 mg/dL (ref 1.7–2.4)

## 2019-11-08 MED ORDER — PANTOPRAZOLE SODIUM 40 MG IV SOLR
40.0000 mg | INTRAVENOUS | Status: DC
  Administered 2019-11-08: 40 mg via INTRAVENOUS
  Filled 2019-11-08: qty 40

## 2019-11-08 MED ORDER — ACETAMINOPHEN 10 MG/ML IV SOLN
1000.0000 mg | Freq: Four times a day (QID) | INTRAVENOUS | Status: DC
  Administered 2019-11-08 – 2019-11-09 (×6): 1000 mg via INTRAVENOUS
  Filled 2019-11-08 (×8): qty 100

## 2019-11-08 MED ORDER — PHENOL 1.4 % MT LIQD
1.0000 | OROMUCOSAL | Status: DC | PRN
  Administered 2019-11-08: 1 via OROMUCOSAL
  Filled 2019-11-08: qty 177

## 2019-11-08 MED ORDER — MAGNESIUM SULFATE 2 GM/50ML IV SOLN
2.0000 g | Freq: Once | INTRAVENOUS | Status: AC
  Administered 2019-11-08: 2 g via INTRAVENOUS
  Filled 2019-11-08: qty 50

## 2019-11-08 MED ORDER — LACTATED RINGERS IV SOLN: INTRAVENOUS | Status: DC

## 2019-11-08 MED ORDER — POTASSIUM CHLORIDE 10 MEQ/100ML IV SOLN
10.0000 meq | INTRAVENOUS | Status: AC
  Administered 2019-11-08 (×4): 10 meq via INTRAVENOUS
  Filled 2019-11-08 (×4): qty 100

## 2019-11-08 MED ORDER — IOHEXOL 300 MG/ML  SOLN
100.0000 mL | Freq: Once | INTRAMUSCULAR | Status: AC | PRN
  Administered 2019-11-08: 100 mL via INTRAVENOUS

## 2019-11-08 MED ORDER — KETOROLAC TROMETHAMINE 30 MG/ML IJ SOLN: 30.0000 mg | Freq: Four times a day (QID) | INTRAMUSCULAR | Status: AC | PRN

## 2019-11-08 NOTE — Progress Notes (Signed)
Radiology at bedside to take portable abdominal xray for NGT placement.

## 2019-11-08 NOTE — Progress Notes (Signed)
Pt only able to tolerate ingesting half of one bottle of CT contrast.  CT notified. Plan to continue with CT at 1130.  Pt made aware of plan.

## 2019-11-08 NOTE — Progress Notes (Signed)
Radiology here to pick Patient up for xray.

## 2019-11-08 NOTE — Lactation Note (Addendum)
This note was copied from a baby's chart. Lactation Consultation Note  Patient Name: Michelle Velez VELFY'B Date: 11/08/2019 Reason for consult: Follow-up assessment;Term P2, 63 hour term female infant-2% weight loss. Mom hx: currently has GT due bile obstruction and blockage. Tools: mom has DEBP in room, not been pumping due sleeping most of the day and infant in [address] and receiving formula.  Per mom, she does want to breastfed her baby, she BF her first child. Per mom,  when she is feeling better she wants to latch baby at breast, currently mom is having pain in throat, hands and eyes, RN was  call to room to assess mom's reported pain. LC assisted mom with hand expression and mom expressed a few drops of colostrum  In a bullet that she will give infant once infant is back in the room. Infant will return to patient's room,  RN call  Central Nursery  To return infant and mom's plan ist to latch infant at breast later tonight. LC assisted mom in using the DEBP, and mom was pumping as LC left the room.  Mom plans to start pumping every 3 hours for 15 minutes on initial setting to help stimulate and establish her milk supply due to infant currently not latching at breast.  Mom knows to call RN or LC if assistance is needed with infant latching at the breast.   Maternal Data    Feeding Feeding Type: Bottle Fed - Formula Nipple Type: Slow - flow  LATCH Score                   Interventions Interventions: Hand express;DEBP  Lactation Tools Discussed/Used     Consult Status Consult Status: Follow-up Date: 11/09/19 Follow-up type: In-patient    Danelle Earthly 11/08/2019, 6:05 PM

## 2019-11-08 NOTE — Progress Notes (Signed)
Was asked to talk with patient about the plan concerning her post-op ileus. Patient is going to get a CT with contrast, a surgical consult has already been called and will evaluate the patient after the CT scan.  Patient reports she is having diarrhea currently, which we assured the patient is a good sign because there is bowel movement. Patient still seems concerned but did not have any other questions and understood the plan.    Evelena Leyden, DO PGY 1 Family Medicine.

## 2019-11-08 NOTE — Progress Notes (Addendum)
Dr. Madilyn Fireman here to evaluate patient.

## 2019-11-08 NOTE — Consult Note (Signed)
[facility] Surgery Consult Note  Lateia Fraser 1990/07/24  [id number].    Requesting MD: Donavan Foil, MD Chief Complaint/Reason for Consult: post-operative ileus vs SBO   HPI:  Ms. Cyrilla Durkin is a 29 y/o F with a PMH gestational thrombocytopenia and previous cesarean section who was admitted to the hospital at [redacted] weeks pregnant 11/05/19 in labor. Patient was admitted to the hospital and hoped to have a vaginal delivery however had arrest of cervical dilation at 9 cm and new onset chorioamnionitis so c-section was recommended. On 7/26 she underwent c-section and LOA by Dr. Mariel Aloe. No intra-operative complications were noted. On POD#1 the patient reported heartburn, nausea, and multiple episodes of bilious emesis. She was unable to tolerate PO. KUB  On POD#2 demonstrated dilated loops of small bowel concerning for ileus vs SBO. CT of the abdomen and pelvis has been ordered and general surgery has been asked to consult. The patient reports that she is having flatus and had a loose, non-bloody BM today.   ROS: Review of Systems  Gastrointestinal: Positive for abdominal pain, nausea and vomiting.  All other systems reviewed and are negative.  Family History  Problem Relation Age of Onset  . Asthma Mother   . Asthma Brother   . Diabetes Maternal Grandmother     Past Medical History:  Diagnosis Date  . Anemia   . Gestational thrombocytopenia (HCC)   . Inguinal hernia 2019  . Scoliosis 2006    Past Surgical History:  Procedure Laterality Date  . CESAREAN SECTION    . CESAREAN SECTION  11/06/2019   Procedure: CESAREAN SECTION;  Surgeon: Warden Fillers, MD;  Location: [facility] LD ORS;  Service: Obstetrics;;    Social History:  reports that she has never smoked. She has never used smokeless tobacco. She reports that she does not drink alcohol and does not use drugs.  Allergies: No Known Allergies  Medications Prior to Admission  Medication Sig Dispense Refill  .  Camphor-Eucalyptus-Menthol (VICKS VAPORUB EX) Apply 1 application topically as needed (nose/congestion).    . Prenatal Vit-Fe Fumarate-FA (MULTIVITAMIN-PRENATAL) 27-0.8 MG TABS tablet Take 1 tablet by mouth daily at 12 noon.    . Misc. Devices (BREAST PUMP) MISC Electric double pump - Breast pump for client to use while breastfeeding. (Patient not taking: Reported on 10/26/2019) 1 each 0    Blood pressure (!) 137/82, pulse 63, temperature 98.6 F (37 C), temperature source Oral, resp. rate 17, height 5\' 2"  (1.575 m), weight 66.8 kg, last menstrual period 01/29/2019, SpO2 98 %, unknown if currently breastfeeding. Physical Exam: Constitutional: NAD; conversant; no deformities Eyes: Moist conjunctiva; no lid lag; anicteric; PERRL Neck: Trachea midline; no thyromegaly Lungs: Normal respiratory effort; no tactile fremitus CV: RRR; no palpable thrills; no pitting edema GI: Abd soft, appropriately tender, moderately distended, pfannenstiel incision c/d/i without surrounding erythema or active drainage, NG tube in place with bilious contents in cannister (650 cc documented). MSK: all 4 extremities symmetric without edema or deformity Psychiatric: Appropriate affect; alert and oriented x3 Lymphatic: No palpable cervical or axillary lymphadenopathy  Results for orders placed or performed during the hospital encounter of 11/05/19 (from the past 48 hour(s))  CBC     Status: Abnormal   Collection Time: 11/08/19  5:40 AM  Result Value Ref Range   WBC 14.2 (H) 4.0 - 10.5 K/uL   RBC 3.94 3.87 - 5.11 MIL/uL   Hemoglobin 12.2 12.0 - 15.0 g/dL   HCT 11/10/19 36 - 46 %   MCV  93.1 80.0 - 100.0 fL   MCH 31.0 26.0 - 34.0 pg   MCHC 33.2 30.0 - 36.0 g/dL   RDW 67.3 41.9 - 37.9 %   Platelets 84 (L) 150 - 400 K/uL    Comment: REPEATED TO VERIFY Immature Platelet Fraction may be clinically indicated, consider ordering this additional test [id number]    nRBC 0.0 0.0 - 0.2 %    Comment: Performed at [facility], [address] [place], [place] [address]  Basic metabolic panel     Status: Abnormal   Collection Time: 11/08/19  8:35 AM  Result Value Ref Range   Sodium 139 135 - 145 mmol/L   Potassium 3.6 3.5 - 5.1 mmol/L   Chloride 104 98 - 111 mmol/L   CO2 25 22 - 32 mmol/L   Glucose, Bld 82 70 - 99 mg/dL    Comment: Glucose reference range applies only to samples taken after fasting for at least 8 hours.   BUN 16 6 - 20 mg/dL   Creatinine, Ser 4.26 0.44 - 1.00 mg/dL   Calcium 8.3 (L) 8.9 - 10.3 mg/dL   GFR calc non Af Amer >60 >60 mL/min   GFR calc Af Amer >60 >60 mL/min   Anion gap 10 5 - 15    Comment: Performed at [facility], [address], [place], [place] [address]  Magnesium     Status: None   Collection Time: 11/08/19  8:35 AM  Result Value Ref Range   Magnesium 1.7 1.7 - 2.4 mg/dL    Comment: Performed at [facility], [address] [place], [place] [address]   DG Abd 1 View  Result Date: 11/08/2019 CLINICAL DATA:  Initial evaluation for abdominal distension. EXAM: ABDOMEN - 1 VIEW COMPARISON:  None. FINDINGS: Multiple prominent and dilated gas-filled loops of bowel are seen within the mid abdomen, with at least 1 loop reflecting small bowel. These measure up to 7.7 cm in diameter. Some gas is seen overlying the rectal vault. No visible free air or pneumatosis. Mild gaseous distension of the stomach noted. No soft tissue mass or abnormal calcification. Visualized osseous structures within normal limits. IMPRESSION: Multiple prominent and dilated gas-filled loops of bowel within the mid abdomen, concerning for possible mechanical bowel obstruction. Further assessment with dedicated cross-sectional imaging of the abdomen and pelvis suggested for further evaluation. Electronically Signed   By: Rise Mu M.D.   On: 11/08/2019 01:36   DG Abd Portable 1V  Result Date: 11/08/2019 CLINICAL DATA:  Check gastric catheter placement EXAM: PORTABLE ABDOMEN - 1  VIEW COMPARISON:  Film from earlier in the same day. FINDINGS: Gastric catheter is now seen within the stomach. Scattered large and small bowel gas is noted. The appearance is mildly improved when compare with the prior exam. IMPRESSION: Gastric catheter within the stomach. Electronically Signed   By: Alcide Clever M.D.   On: 11/08/2019 03:00   Assessment/Plan POD#2 s/p cesarean section 7/26 Dr. Donavan Foil   Post-operative ileus  - afebrile, VSS, WBC 14.2 from 14.6 yesterday - CT abdomen/pelvis 7/28 w/ dilated loops of small bowel, gas in the colon, consistent with post-operative ileus.  - continue NG to LIWS given high NG tube output and await further bowel function - minimize narcotic medications, OOB and ambulate 3-4x daily  - replete electrolytes to goal of K > 4.0 and Mg > 2.0 (40 mEq IV KCl and 2g IV Mag sulfate ordered) - will repeat plain film tomorrow  morning and if improving may d/c NGT and start CLD tomorrow   Juliet Rude , PA-C [facility] 11/08/2019, 1:33 PM Please see Amion for pager number during day hours 7:00am-4:30pm

## 2019-11-08 NOTE — Progress Notes (Addendum)
Subjective: Postpartum Day 2 s/p: repeat Cesarean Delivery 2/2 to failed TOLAC and AoD.  Overnight KUB was obtained due to abdominal distension, discomfort and continous decrease appetitie with urge to vomit. KUB demonstrated multiple large dialted loops of bowel. NG tube placed and patient is kept NPO excepts for meds and sips. Pain medications were adjusted due to ileus. Patient was evaluated again this morning and canister was filled to the top with green bile. Patient reports feeling better. Denies pain, but reports feeling uncomfortable due to the NG tube placed. She is continuing to breast feed which has become somewhat difficulty with everything going on. Denies chest pain, vomiting, calf pain and SOB.    Objective: Vital signs in last 24 hours: Temp:  [98.6 F (37 C)-98.9 F (37.2 C)] 98.6 F (37 C) (07/28 0500) Pulse Rate:  [63-83] 63 (07/28 0500) Resp:  [14-17] 17 (07/28 0500) BP: (132-146)/(82-88) 137/82 (07/28 0500) SpO2:  [97 %-98 %] 98 % (07/27 2139)  Physical Exam:  General: alert, cooperative, appears stated age and mild distress Lochia: appropriate Uterine Fundus: firm Abdomen: Firm and distended (improved overnight night), hypoactive bowel sounds. Tender to palpation diffusely Incision: healing well, no significant drainage, no significant erythema DVT Evaluation: No evidence of DVT seen on physical exam. Negative Homan's sign. No cords or calf tenderness. No significant calf/ankle edema.  Recent Labs    11/06/19 0719 11/08/19 0540  HGB 12.8 12.2  HCT 39.2 36.7    Assessment/Plan: Status post Cesarean section. Postoperative course complicated by post-op ileus  Continue current care. NG tube in place on low intermittent suction. NPO except meds and sips. Advance diet as tolerated. Pain regimen adjusted due to ileus. Blood pressures remain moderate pressures, continue Norvasc daily.    Phill Myron, DO 11/08/2019, 7:07 AM  GME ATTESTATION:  I saw and  evaluated the patient. I agree with the findings and the plan of care as documented in the resident's note with addition of the following:  750 mL dark green bilious fluid out over the last 6 hours. SBO v ileus. General surgery consulted, recommended CT with oral contrast, they will see her later this morning- appreciate their input.  Marlowe Alt, DO OB Fellow, Faculty [facility], Center for [facility] 11/08/2019 7:55 AM

## 2019-11-08 NOTE — Progress Notes (Signed)
Progress note: Patient is POD 2 s/p repeat cesarean section. Nurse called and notified the patient has not eaten much today and her abdomen appears distended. I went to evaluate patient.  She reports not having a appetite all day and she has a urge to vomit. She states that she does not feel nauseous but can not keep anything down. She is tender to touch.  She reports having several bouts of flatus but denies any bowel movement. She denies chest pain, SOB, lightheadedness but does report feeling chills.  Review of Systems   All systems reviewed and negative except as stated in HPI  Physical exam:  Blood pressure (!) 146/88, pulse 83, temperature 98.7 F (37.1 C), temperature source Oral, resp. rate 14, height 5\' 2"  (1.575 m), weight 66.8 kg, last menstrual period 01/29/2019, SpO2 98 %. General appearance: alert, cooperative, appears stated age and moderate distress Lungs: clear to auscultation bilaterally Heart: regular rate and rhythm Abdomen:firm and distended, tender to palpation all quadrants, hypoactive bowel sounds.   Extremities: Homans sign is negative, no sign of DVT   Assessment/Plan:  Michelle Velez is a 29 y.o. 30 s/p repeat Cesarean section 2/2 failed TOLAC/AoD at [redacted]w[redacted]d. Course complicated by Chorio received amp/gent x24H, gestational thrombocytopenia and mild PreE. Concern for new onset post-op ileus. Will obtained KUB. Patient will be kept NPO except meds. Start maintainance fluids. Order NG tube.     [redacted]w[redacted]d, DO  11/08/2019, 12:55 AM

## 2019-11-08 NOTE — Lactation Note (Signed)
This note was copied from a baby's chart. Lactation Consultation Note  Patient Name: Michelle Velez Date: 11/08/2019   I followed up with [name]. She reports that she has been quite ill in the last 24 hours, and she has been formula feeding her son, Michelle Velez. Her sister was in the room as her support person, and I showed her how to swaddle Damari.  [name] indicated that she did want to breast feed when she felt physically better, and she would call for latch assistance. She states that she needs a breast pump.   This note was entered late due to major incident of 7/27 in the early evening that disrupted access to Epic.  Feeding Feeding Type: Bottle Fed - Formula Nipple Type: Slow - flow   Walker Shadow 11/08/2019, 3:37 PM

## 2019-11-08 NOTE — Progress Notes (Signed)
Insensitive spirometer used x5. Max achieved is 1000. Encouraged patient to use every hour. Royston Cowper, RN

## 2019-11-08 NOTE — Progress Notes (Signed)
Patient's NGT placed by Felipa Eth, RN. NGT taped and secured to patient's nose. NGT to low intermittent suction. 20 gauge PIV placed in patient's right hand. LR IV fluids to infuse at 100 ml/hr.

## 2019-11-08 NOTE — Progress Notes (Signed)
Patient states that she isn't feeling well. That she has no appetite, feels bloated but is passing gas. Patient has not ate or vomited. Patient refused her oral meds tonight except Neurontin because of fear she may vomit. Bowel sounds are hypoactive on assessment. Abdomen is soft and distended on palpation. Vital signs are stable. Discussed with patient that I would call the doctor to report an update.

## 2019-11-08 NOTE — Progress Notes (Addendum)
Dr. Dewitt Rota at patient's bedside to discuss treatment plan.

## 2019-11-09 ENCOUNTER — Other Ambulatory Visit: Payer: 59

## 2019-11-09 ENCOUNTER — Encounter: Payer: 59 | Admitting: Obstetrics and Gynecology

## 2019-11-09 ENCOUNTER — Inpatient Hospital Stay (HOSPITAL_COMMUNITY): Payer: 59

## 2019-11-09 LAB — CBC WITH DIFFERENTIAL/PLATELET
Abs Immature Granulocytes: 0.05 10*3/uL (ref 0.00–0.07)
Basophils Absolute: 0 10*3/uL (ref 0.0–0.1)
Basophils Relative: 0 %
Eosinophils Absolute: 0.2 10*3/uL (ref 0.0–0.5)
Eosinophils Relative: 2 %
HCT: 36.1 % (ref 36.0–46.0)
Hemoglobin: 12.1 g/dL (ref 12.0–15.0)
Immature Granulocytes: 1 %
Lymphocytes Relative: 5 %
Lymphs Abs: 0.5 10*3/uL — ABNORMAL LOW (ref 0.7–4.0)
MCH: 31.8 pg (ref 26.0–34.0)
MCHC: 33.5 g/dL (ref 30.0–36.0)
MCV: 95 fL (ref 80.0–100.0)
Monocytes Absolute: 0.9 10*3/uL (ref 0.1–1.0)
Monocytes Relative: 8 %
Neutro Abs: 9.1 10*3/uL — ABNORMAL HIGH (ref 1.7–7.7)
Neutrophils Relative %: 84 %
Platelets: 91 10*3/uL — ABNORMAL LOW (ref 150–400)
RBC: 3.8 MIL/uL — ABNORMAL LOW (ref 3.87–5.11)
RDW: 12.7 % (ref 11.5–15.5)
WBC: 10.7 10*3/uL — ABNORMAL HIGH (ref 4.0–10.5)
nRBC: 0 % (ref 0.0–0.2)

## 2019-11-09 LAB — COMPREHENSIVE METABOLIC PANEL
ALT: 14 U/L (ref 0–44)
AST: 17 U/L (ref 15–41)
Albumin: 2 g/dL — ABNORMAL LOW (ref 3.5–5.0)
Alkaline Phosphatase: 54 U/L (ref 38–126)
Anion gap: 10 (ref 5–15)
BUN: 16 mg/dL (ref 6–20)
CO2: 22 mmol/L (ref 22–32)
Calcium: 8.1 mg/dL — ABNORMAL LOW (ref 8.9–10.3)
Chloride: 106 mmol/L (ref 98–111)
Creatinine, Ser: 0.64 mg/dL (ref 0.44–1.00)
GFR calc Af Amer: >60 mL/min (ref >60)
GFR calc non Af Amer: >60 mL/min (ref >60)
Glucose, Bld: 61 mg/dL — ABNORMAL LOW (ref 70–99)
Potassium: 3.7 mmol/L (ref 3.5–5.1)
Sodium: 138 mmol/L (ref 135–145)
Total Bilirubin: 0.8 mg/dL (ref 0.3–1.2)
Total Protein: 4.7 g/dL — ABNORMAL LOW (ref 6.5–8.1)

## 2019-11-09 LAB — BASIC METABOLIC PANEL
Anion gap: 9 (ref 5–15)
BUN: 15 mg/dL (ref 6–20)
CO2: 25 mmol/L (ref 22–32)
Calcium: 8.2 mg/dL — ABNORMAL LOW (ref 8.9–10.3)
Chloride: 104 mmol/L (ref 98–111)
Creatinine, Ser: 0.68 mg/dL (ref 0.44–1.00)
GFR calc Af Amer: >60 mL/min (ref >60)
GFR calc non Af Amer: >60 mL/min (ref >60)
Glucose, Bld: 71 mg/dL (ref 70–99)
Potassium: 3.7 mmol/L (ref 3.5–5.1)
Sodium: 138 mmol/L (ref 135–145)

## 2019-11-09 LAB — PLATELET COUNT: Platelets: 103 10*3/uL — ABNORMAL LOW (ref 150–400)

## 2019-11-09 LAB — MAGNESIUM
Magnesium: 1.8 mg/dL (ref 1.7–2.4)
Magnesium: 2 mg/dL (ref 1.7–2.4)

## 2019-11-09 MED ORDER — GABAPENTIN 100 MG PO CAPS
100.0000 mg | ORAL_CAPSULE | Freq: Three times a day (TID) | ORAL | Status: DC
  Administered 2019-11-09 – 2019-11-10 (×3): 100 mg via ORAL
  Filled 2019-11-09 (×3): qty 1

## 2019-11-09 MED ORDER — MAGNESIUM SULFATE 2 GM/50ML IV SOLN
2.0000 g | Freq: Once | INTRAVENOUS | Status: AC
  Administered 2019-11-09: 2 g via INTRAVENOUS
  Filled 2019-11-09: qty 50

## 2019-11-09 MED ORDER — PANTOPRAZOLE SODIUM 40 MG PO TBEC
40.0000 mg | DELAYED_RELEASE_TABLET | Freq: Every day | ORAL | Status: DC
  Administered 2019-11-09 – 2019-11-10 (×2): 40 mg via ORAL
  Filled 2019-11-09 (×2): qty 1

## 2019-11-09 MED ORDER — POTASSIUM CHLORIDE 20 MEQ PO PACK
40.0000 meq | PACK | Freq: Once | ORAL | Status: AC
  Administered 2019-11-09: 40 meq via ORAL
  Filled 2019-11-09 (×2): qty 2

## 2019-11-09 MED ORDER — POTASSIUM CHLORIDE 10 MEQ/100ML IV SOLN
10.0000 meq | INTRAVENOUS | Status: DC
  Administered 2019-11-09: 10 meq via INTRAVENOUS
  Filled 2019-11-09 (×4): qty 100

## 2019-11-09 MED ORDER — AMLODIPINE BESYLATE 5 MG PO TABS
5.0000 mg | ORAL_TABLET | Freq: Every day | ORAL | Status: DC
  Administered 2019-11-09 – 2019-11-10 (×2): 5 mg via ORAL
  Filled 2019-11-09 (×2): qty 1

## 2019-11-09 MED ORDER — OXYCODONE HCL 5 MG PO TABS: 5.0000 mg | ORAL_TABLET | ORAL | Status: DC | PRN

## 2019-11-09 MED ORDER — PRENATAL MULTIVITAMIN CH: 1.0000 | ORAL_TABLET | Freq: Every day | ORAL | Status: DC

## 2019-11-09 MED ORDER — IBUPROFEN 600 MG PO TABS
600.0000 mg | ORAL_TABLET | Freq: Four times a day (QID) | ORAL | Status: DC
  Administered 2019-11-09 – 2019-11-10 (×4): 600 mg via ORAL
  Filled 2019-11-09 (×4): qty 1

## 2019-11-09 MED ORDER — ACETAMINOPHEN 500 MG PO TABS
1000.0000 mg | ORAL_TABLET | Freq: Three times a day (TID) | ORAL | Status: DC
  Administered 2019-11-09 – 2019-11-10 (×2): 1000 mg via ORAL
  Filled 2019-11-09 (×2): qty 2

## 2019-11-09 MED ORDER — POTASSIUM CHLORIDE CRYS ER 20 MEQ PO TBCR: 40.0000 meq | EXTENDED_RELEASE_TABLET | Freq: Once | ORAL | Status: DC

## 2019-11-09 MED ORDER — POTASSIUM CHLORIDE 20 MEQ PO PACK
40.0000 meq | PACK | Freq: Once | ORAL | Status: AC
  Administered 2019-11-09: 40 meq via ORAL
  Filled 2019-11-09: qty 2

## 2019-11-09 NOTE — Progress Notes (Signed)
Xray at bedside  Patrica Duel, RN 11/09/2019 7:00 AM

## 2019-11-09 NOTE — Progress Notes (Signed)
[facility] Progress Note  3 Days Post-Op  Subjective: Patient denies bloating, nausea or vomiting. Having bowel function. Wants NGT out.   Objective: Vital signs in last 24 hours: Temp:  [98.2 F (36.8 C)-98.5 F (36.9 C)] 98.3 F (36.8 C) (07/29 0622) Pulse Rate:  [55-67] 67 (07/29 0622) Resp:  [16-18] 16 (07/29 0622) BP: (143-153)/(77-86) 150/86 (07/29 0729) SpO2:  [99 %] 99 % (07/29 0622)    Intake/Output from previous day: 07/28 0701 - 07/29 0700 In: 1651.1 [I.V.:1451.1; IV Piggyback:200] Out: 800 [Emesis/NG output:800] Intake/Output this shift: No intake/output data recorded.  PE: Constitutional: NAD; conversant; no deformities Eyes: Moist conjunctiva; no lid lag; anicteric; PERRL Neck: Trachea midline; no thyromegaly Lungs: Normal respiratory effort; no tactile fremitus CV: RRR; no palpable thrills; no pitting edema GI: Abd soft, appropriately tender, non-distended, +BS MSK: all 4 extremities symmetric without edema or deformity Psychiatric: Appropriate affect; alert and oriented x3     Lab Results:  Recent Labs    11/08/19 0540 11/09/19 0516  WBC 14.2* 10.7*  HGB 12.2 12.1  HCT 36.7 36.1  PLT 84* 91*   BMET Recent Labs    11/08/19 0835 11/09/19 0516  NA 139 138  K 3.6 3.7  CL 104 106  CO2 25 22  GLUCOSE 82 61*  BUN 16 16  CREATININE 0.64 0.64  CALCIUM 8.3* 8.1*   PT/INR No results for input(s): LABPROT, INR in the last 72 hours. CMP     Component Value Date/Time   NA 138 11/09/2019 0516   NA 138 08/22/2019 1449   K 3.7 11/09/2019 0516   CL 106 11/09/2019 0516   CO2 22 11/09/2019 0516   GLUCOSE 61 (L) 11/09/2019 0516   BUN 16 11/09/2019 0516   BUN 6 08/22/2019 1449   CREATININE 0.64 11/09/2019 0516   CREATININE 0.58 10/24/2019 1210   CALCIUM 8.1 (L) 11/09/2019 0516   PROT 4.7 (L) 11/09/2019 0516   PROT 6.2 08/22/2019 1449   ALBUMIN 2.0 (L) 11/09/2019 0516   ALBUMIN 3.8 (L) 08/22/2019 1449   AST 17 11/09/2019 0516    AST 17 10/24/2019 1210   ALT 14 11/09/2019 0516   ALT 16 10/24/2019 1210   ALKPHOS 54 11/09/2019 0516   BILITOT 0.8 11/09/2019 0516   BILITOT 0.3 10/24/2019 1210   GFRNONAA >60 11/09/2019 0516   GFRNONAA >60 10/24/2019 1210   GFRAA >60 11/09/2019 0516   GFRAA >60 10/24/2019 1210   Lipase  No results found for: LIPASE     Studies/Results: DG Abd 1 View  Result Date: 11/08/2019 CLINICAL DATA:  Initial evaluation for abdominal distension. EXAM: ABDOMEN - 1 VIEW COMPARISON:  None. FINDINGS: Multiple prominent and dilated gas-filled loops of bowel are seen within the mid abdomen, with at least 1 loop reflecting small bowel. These measure up to 7.7 cm in diameter. Some gas is seen overlying the rectal vault. No visible free air or pneumatosis. Mild gaseous distension of the stomach noted. No soft tissue mass or abnormal calcification. Visualized osseous structures within normal limits. IMPRESSION: Multiple prominent and dilated gas-filled loops of bowel within the mid abdomen, concerning for possible mechanical bowel obstruction. Further assessment with dedicated cross-sectional imaging of the abdomen and pelvis suggested for further evaluation. Electronically Signed   By: Rise Mu M.D.   On: 11/08/2019 01:36   CT ABDOMEN PELVIS W CONTRAST  Result Date: 11/08/2019 CLINICAL DATA:  Recent cesarean section. Concern for a bowel obstruction. EXAM: CT ABDOMEN AND PELVIS WITH CONTRAST TECHNIQUE:  Multidetector CT imaging of the abdomen and pelvis was performed using the standard protocol following bolus administration of intravenous contrast. CONTRAST:  OMNIPAQUE IOHEXOL 300 MG/ML  SOLN COMPARISON:  Abdominal radiograph 11/08/2019 FINDINGS: Lower chest: Patchy densities in left lower lobe could represent atelectasis buy cannot exclude mild pneumonia or aspiration. Atelectasis at the right lung base. Trace bilateral pleural fluid. Hepatobiliary: Trace perihepatic ascites. No biliary  dilatation. Tiny density along the hepatic dome probably represents a cyst but too small to characterize. Gallbladder is not distended to poorly characterized on this examination. Pancreas: Unremarkable. No pancreatic ductal dilatation or surrounding inflammatory changes. Spleen: Perisplenic ascites.  Spleen size is normal. Adrenals/Urinary Tract: Normal adrenal glands. Small amount of contrast in the collecting system and limited evaluation for kidney stones. No hydronephrosis. No suspicious renal lesions. Small amount of gas in the urinary bladder and likely iatrogenic. Urinary bladder is decompressed. Stomach/Bowel: Nasogastric tube terminates in the gastric body. The stomach is not distended. Duodenum is decompressed. Small bowel loops are dilated with fluid and air. There is gas within the transverse colon. Left colon appears to be decompressed. Small amount of fluid in the rectum. Vascular/Lymphatic: No significant vascular findings are present. No enlarged abdominal or pelvic lymph nodes. Reproductive: Uterus is markedly enlarged and compatible with recent gestation. Enlarged periuterine veins. No evidence for an adnexal or ovarian mass. Other: Trace pelvic ascites. Small amount of ascites in the left lower quadrant and left upper quadrant. Minimal right upper quadrant ascites in the perihepatic region. Soft tissues in the anterior pelvic wall are edematous and there is a small amount of gas along the lower anterior abdominal wall compatible with recent surgery. Musculoskeletal: No acute bone abnormalities. IMPRESSION: 1. Dilated loops of small bowel with gas in the transverse colon. Based on the recent surgery and the bowel gas pattern, favor adynamic ileus rather than mechanical obstruction. Consider continued follow-up with abdominal radiography. 2. Small amount of ascites in the abdomen and pelvis as described. Fluid is nonspecific and may be related to recent surgery. 3. Trace pleural fluid with patchy  densities in left lower lobe. Left lower lobe disease could be related to atelectasis but cannot exclude a small focus of pneumonia or aspiration. 4. Enlarged uterus compatible with recent gestation. Postoperative changes along the anterior abdominal/pelvic wall. Electronically Signed   By: Richarda Overlie M.D.   On: 11/08/2019 12:39   DG Abd Portable 1V  Result Date: 11/09/2019 CLINICAL DATA:  Ileus.  Recent C-section. EXAM: PORTABLE ABDOMEN - 1 VIEW COMPARISON:  CT 11/08/2019.  Abdomen 11/08/2019. FINDINGS: NG tube noted with tip over the stomach. No gastric distention. Previously identified small bowel distention has improved with persistent mild distention. Air noted within the colon. Findings consistent with improving adynamic ileus. No free air identified. Right hemidiaphragm incompletely imaged. No acute bony abnormality. IMPRESSION: NG tube noted with tip over the stomach. No gastric distention. Previously identified small bowel distention has improved with persistent mild distention. Air noted within the colon. Findings consistent with improving adynamic ileus. Continued follow-up exams to demonstrate complete resolution suggested. Electronically Signed   By: Maisie Fus  Register   On: 11/09/2019 07:54   DG Abd Portable 1V  Result Date: 11/08/2019 CLINICAL DATA:  Check gastric catheter placement EXAM: PORTABLE ABDOMEN - 1 VIEW COMPARISON:  Film from earlier in the same day. FINDINGS: Gastric catheter is now seen within the stomach. Scattered large and small bowel gas is noted. The appearance is mildly improved when compare with the prior exam.  IMPRESSION: Gastric catheter within the stomach. Electronically Signed   By: Alcide Clever M.D.   On: 11/08/2019 03:00    Anti-infectives: Anti-infectives (From admission, onward)   Start     Dose/Rate Route Frequency Ordered Stop   11/06/19 0800  ampicillin (OMNIPEN) 2 g in sodium chloride 0.9 % 100 mL IVPB        2 g 300 mL/hr over 20 Minutes Intravenous Every  6 hours 11/06/19 0450 11/07/19 0315   11/06/19 0200  ampicillin (OMNIPEN) 2 g in sodium chloride 0.9 % 100 mL IVPB  Status:  Discontinued        2 g 300 mL/hr over 20 Minutes Intravenous Every 6 hours 11/06/19 0120 11/06/19 0450   11/06/19 0200  gentamicin (GARAMYCIN) 330 mg in dextrose 5 % 100 mL IVPB  Status:  Discontinued        5 mg/kg  66.8 kg 108.3 mL/hr over 60 Minutes Intravenous Every 24 hours 11/06/19 0137 11/06/19 0450   11/06/19 0145  azithromycin (ZITHROMAX) 500 mg in sodium chloride 0.9 % 250 mL IVPB        500 mg 250 mL/hr over 60 Minutes Intravenous  Once 11/06/19 0134 11/06/19 0230       Assessment/Plan POD#3 s/p cesarean section 7/26 Dr. Donavan Foil   Post-operative ileus  - afebrile, VSS, WBC normalizing - CT abdomen/pelvis 7/28 w/ dilated loops of small bowel, gas in the colon, consistent with post-operative ileus.  - KUB this AM significantly improved from yesterday and patient having bowel function - removed NGT and started on CLD - would not advance past FLD today - minimize narcotic medications, OOB and ambulate 3-4x daily  - replete electrolytes to goal of K > 4.0 and Mg > 2.0 (40 mEq IV KCl and 2g IV Mag sulfate ordered) - no surgical issues from a general surgery standpoint, we will sign off, please call with further questions   LOS: 4 days    Juliet Rude , [facility] 11/09/2019, 11:12 AM Please see Amion for pager number during day hours 7:00am-4:30pm

## 2019-11-09 NOTE — Progress Notes (Addendum)
Pt reports vomiting/gagging and feeling like the NG tube is "moving down her throat" and making her sick. RN verbalized understanding and provided emotional support. Length of external NGT segment has not changed on this shift. Around 400cc of brown/tan, slightly pink-tinged drainage in canister. Abdomen distended but soft and nontender to palpation. PRN Zofran given, MD notified. Dr. Loma Boston at bedside to discuss plan of care. No new orders received. Chloraseptic spray and rest encouraged. Per MD- will reassess in the morning.   Pt has ambulated x3 tonight and has had 3 stools. Earlier she reported gas "building up" and abdominal distention, but has been passing flatus, reports improvement with ambulation.  Patrica Duel, RN 11/09/2019 3:02 AM

## 2019-11-09 NOTE — Progress Notes (Signed)
CSW received consult due to score 13 on Edinburgh Depression Screen.    CSW spoke with MOB at bedside to address further needs. CSW congratulated MOB on the birth of infant. CSW advised MOB of HIPPA policy in which MOB reported that it was her sister in the room and that it was okay for her to remain in the room while CSW spoke with MOB. MOB reported that over the last 7 days she felt nervous and an little anxiety. MOB reported a hx of anxiety around the age of 84-19 but reports no concerns since. MOB reported that she didn't feel depressed during her pregnancy but again reported some anxiety. MOB reported that she was never placed on medication for her anxiety but reports "I see a therapist for other reasons". MOB reported that she has the ability to f/u with her therapist as needed. MOB reported no other mental health hx and denies SI, HI and DV to this CSW.   MOB reported that she has all needed items to care for infant including carseat and basinet at home. MOB reported no other needs to this CSW as CSW provided education regarding Baby Blues vs PMADs.  CSW encouraged MOB to evaluate her mental health throughout the postpartum period with the use of the New Mom Checklist developed by Postpartum [employer] as well as the [place] Postnatal Depression Scale and notify a medical professional if symptoms arise.     Claude Manges Ramah Langhans, MSW, LCSW [facility] [phone]

## 2019-11-09 NOTE — Lactation Note (Signed)
This note was copied from a baby's chart. Lactation Consultation Note  Patient Name: Michelle Velez [id number] Date: 11/09/2019 Reason for consult: Follow-up assessment;Term;Other (Comment) (mom having GI issues , and has a N/G)  Baby is 78 hours old/ receiving formula in bottles due to mom not feeling well due to GI issues and having a N/G. Baby taking 35 - 60 mil per feeding.  Per mom  pumped x 1 yesterday with drops and asked if that was normal.  LC reassured mom the pumping can be a slow process and if she feels up to pumping today to call for [facility] and she can assist if needed.     Maternal Data    Feeding Feeding Type: Formula  LATCH Score                   Interventions Interventions: Breast feeding basics reviewed  Lactation Tools Discussed/Used Tools: Pump Breast pump type: Double-Electric Breast Pump   Consult Status Consult Status: Follow-up Date: 11/10/19 Follow-up type: In-patient    [name] 11/09/2019, 9:21 AM

## 2019-11-09 NOTE — Progress Notes (Signed)
Subjective: Postpartum Day 3: Cesarean Delivery 2/2 failed TOLAC and arrest of descent. Patient reports since NG tube has been removed, she has tolerated PO liquid diet. Has had 2 BMs (mix liquid and stool, mostly liquid diarrhea). No nausea or vomiting with PO intake.   Denies HA, scitomas, vision changes, RUQ pain. Does state her distension of her abdomen causing discomfort slightly, not as badly as previously. Mild edema in legs. Able to ambulate to bathroom. Voiding on own. Denies F/C.    Objective: Vital signs in last 24 hours: Temp:  [98.2 F (36.8 C)-98.6 F (37 C)] 98.6 F (37 C) (07/29 1424) Pulse Rate:  [59-75] 75 (07/29 1424) Resp:  [16-18] 18 (07/29 1424) BP: (127-153)/(75-86) 127/76 (07/29 1424) SpO2:  [99 %-100 %] 100 % (07/29 1424)  BP Readings from Last 3 Encounters:  11/09/19 127/76  11/02/19 122/70  10/26/19 125/67     Physical Exam:  General: alert, cooperative, appears stated age and no distress Lochia: appropriate Uterine Fundus: firm Abdomen: soft, distended, dullness to percussion, appropriately tender to palpation, +BS Incision: healing well, no significant drainage, no dehiscence DVT Evaluation: No evidence of DVT seen on physical exam. Negative Homan's sign.  Lab Results  Component Value Date   WBC 10.7 (H) 11/09/2019   HGB 12.1 11/09/2019   HCT 36.1 11/09/2019   MCV 95.0 11/09/2019   PLT 91 (L) 11/09/2019    Assessment/Plan: Status post Cesarean section, POD#3. Post-op Ileus. Preeclampsia without severe features. Hypokalemia and hypomagnesemia. Gestational thrombocytopenia  Normal post-op pain. Continue current care. Ileus resolving. Gen Surg following, NG Tube removed, tolerating liquid diet, advance diet tomorrow if continues to do well. Switching IV replacement to PO replacement of K+ and Mag. (per Gen Surg, goal K+ > 4.0, Mg > 2.0), s/p 50 meq K+ and 2g IV Mag. Rpt Mag and K+ levels this evening. Avoid opioid pain meds when  possible. BP stable, 5mg  Norvasc. Restart gabapentin when tolerating PO intake.  Rpt plt level this evening   , DO 11/09/2019, 3:07 PM

## 2019-11-10 ENCOUNTER — Encounter (HOSPITAL_COMMUNITY): Payer: PRIVATE HEALTH INSURANCE

## 2019-11-10 LAB — POTASSIUM: Potassium: 4 mmol/L (ref 3.5–5.1)

## 2019-11-10 LAB — MAGNESIUM: Magnesium: 1.7 mg/dL (ref 1.7–2.4)

## 2019-11-10 MED ORDER — AMLODIPINE BESYLATE 5 MG PO TABS: 5.0000 mg | ORAL_TABLET | Freq: Every day | ORAL | 1 refills | Status: DC

## 2019-11-10 MED ORDER — IBUPROFEN 600 MG PO TABS: 600.0000 mg | ORAL_TABLET | Freq: Four times a day (QID) | ORAL | 0 refills | Status: DC

## 2019-11-10 MED ORDER — OXYCODONE HCL 5 MG PO TABS: 5.0000 mg | ORAL_TABLET | Freq: Four times a day (QID) | ORAL | 0 refills | Status: AC | PRN

## 2019-11-10 MED ORDER — COCONUT OIL OIL: 1.0000 "application " | TOPICAL_OIL | 0 refills | Status: DC | PRN

## 2019-11-10 MED ORDER — ACETAMINOPHEN 500 MG PO TABS: 1000.0000 mg | ORAL_TABLET | Freq: Three times a day (TID) | ORAL | 0 refills | Status: DC

## 2019-11-10 MED ORDER — MAGNESIUM SULFATE IN D5W 1-5 GM/100ML-% IV SOLN
1.0000 g | Freq: Once | INTRAVENOUS | Status: AC
  Administered 2019-11-10: 1 g via INTRAVENOUS
  Filled 2019-11-10: qty 100

## 2019-11-10 MED FILL — oxyCODONE HCL 5 MG TABS: 5 | 3 days supply | Qty: 12 | Fill #0

## 2019-11-10 MED FILL — AMLODIPINE BESYLATE 5 MG TA: 5 | 30 days supply | Qty: 30 | Fill #0

## 2019-11-10 NOTE — Lactation Note (Signed)
This note was copied from a baby's chart. Lactation Consultation Note  Patient Name: Michelle Velez [id number] Date: 11/10/2019 Reason for consult: Follow-up assessment;Term;Other (Comment) (see LC note for details) Baby is 39 days old and has been fed all bottles ( formula ) due  To mom GI issues. Per mom has not pumped in the last 24 hours.  Per mom has a DEBP - Spectra at home and plans on pumping when she gets home to bring her milk in so the baby will be more satisfied at the breast.  LC reviewed supply and demand / importance of consistent pumping until the baby is latching consistently to establish and protect her milk supply.  Pumping is like a feeding - 8-10 times a day.  In the mean time LC recommended trying  To latch after baby has received 1/2 a feeding and see if he will latch and sustain it for at least 10 -15 mins. If not finish feeding , keep up the pumping, and when breast are fuller baby will be more patient. Sore nipple and engorgement prevention and tx reviewed.  Per mom has a DEBP - Spectra at home. LC offered to request and LC O/P appt for later next week and mom receptive.  LC placed a request in epic basket for the clinic to call mom.   Maternal Data    Feeding Feeding Type:  (per mom baby has not breast , see LC note) Nipple Type: Slow - flow  LATCH Score                   Interventions Interventions: Breast feeding basics reviewed  Lactation Tools Discussed/Used Tools: Pump;Flanges Flange Size: 27;24 Breast pump type: Double-Electric Breast Pump Pump Review: Milk Storage   Consult Status Consult Status: Follow-up Follow-up type: Out-patient    Michelle Velez 11/10/2019, 12:11 PM

## 2019-11-13 ENCOUNTER — Ambulatory Visit (INDEPENDENT_AMBULATORY_CARE_PROVIDER_SITE_OTHER): Payer: 59 | Admitting: *Deleted

## 2019-11-13 ENCOUNTER — Other Ambulatory Visit: Payer: Self-pay

## 2019-11-13 VITALS — BP 124/83 | HR 72 | Wt 133.4 lb

## 2019-11-13 DIAGNOSIS — R319 Hematuria, unspecified: Secondary | ICD-10-CM

## 2019-11-13 LAB — POCT URINALYSIS DIP (DEVICE)
Bilirubin Urine: NEGATIVE
Glucose, UA: NEGATIVE mg/dL
Ketones, ur: NEGATIVE mg/dL
Nitrite: NEGATIVE
Protein, ur: NEGATIVE mg/dL
Specific Gravity, Urine: 1.025 (ref 1.005–1.030)
Urobilinogen, UA: 0.2 mg/dL (ref 0.0–1.0)
pH: 7 (ref 5.0–8.0)

## 2019-11-13 NOTE — Progress Notes (Signed)
Here for wound check and bp check.  Denies headaches or visual changes or epigastic pain. C/o dent in right lower leg below knee, but denies pain in calf- 1+ edema to feet/ ankles.  No edema in face or hands.   C/o saw blood in her urine- will do clean catch urinalysis.  BP WNL.   Wound intact with honeycomb dressing. Dressing removed . Incision remains clean, dry, intact without redness, edema, or discharge. Advised to keep wound clean, dry, and check for redness, discharge, opening of incision.  Advised to keep pp appointment.  Advised to continue norvasc as ordered.  Call if any issues with wound or if increased edema, headache not relieved by tylenol to go to hospital.  Informed UA showed blood which could be vaginal but sent for culture; if positive we will contact her with treatment.  She voices understanding.  Aeden Matranga,RN

## 2019-11-13 NOTE — Progress Notes (Signed)
Agree with A & P. 

## 2019-11-14 LAB — URINE CULTURE: Organism ID, Bacteria: NO GROWTH

## 2019-11-20 ENCOUNTER — Telehealth: Payer: Self-pay

## 2019-11-20 ENCOUNTER — Other Ambulatory Visit: Payer: Self-pay

## 2019-11-20 ENCOUNTER — Ambulatory Visit (INDEPENDENT_AMBULATORY_CARE_PROVIDER_SITE_OTHER): Payer: 59 | Admitting: Clinical

## 2019-11-20 DIAGNOSIS — F419 Anxiety disorder, unspecified: Secondary | ICD-10-CM | POA: Diagnosis not present

## 2019-11-20 NOTE — Telephone Encounter (Signed)
-----   Message from Rae Lips, [place] sent at 11/20/2019 11:25 AM EDT ----- Pt requests call-back:   # Wants to know if it's normal to have blood in stool two weeks after cesarean

## 2019-11-21 ENCOUNTER — Encounter: Payer: Self-pay | Admitting: Emergency Medicine

## 2019-11-21 ENCOUNTER — Ambulatory Visit
Admission: EM | Admit: 2019-11-21 | Discharge: 2019-11-21 | Disposition: A | Discharge status: Home / Self Care | Payer: 59 | Attending: Emergency Medicine | Admitting: Emergency Medicine

## 2019-11-21 ENCOUNTER — Other Ambulatory Visit: Payer: Self-pay

## 2019-11-21 DIAGNOSIS — N61 Mastitis without abscess: Secondary | ICD-10-CM | POA: Diagnosis not present

## 2019-11-21 MED ORDER — CEPHALEXIN 500 MG PO CAPS: 500.0000 mg | ORAL_CAPSULE | Freq: Four times a day (QID) | ORAL | 0 refills | Status: DC

## 2019-11-21 MED ORDER — ACETAMINOPHEN 325 MG PO TABS
650.0000 mg | ORAL_TABLET | Freq: Once | ORAL | Status: AC
  Administered 2019-11-21: 650 mg via ORAL

## 2019-11-21 NOTE — Discharge Instructions (Addendum)
Keep area(s) clean and dry. Apply hot compress / towel for 5-10 minutes 3-5 times daily. Take antibiotic as prescribed with food - important to complete course. Return for worsening pain, redness, swelling, discharge, fever 

## 2019-11-21 NOTE — ED Provider Notes (Signed)
[name]    CSN: [id number] Arrival date & time: 11/21/19  1729      History   Chief Complaint Chief Complaint  Patient presents with  . Fever  . Breast Pain    HPI Michelle Velez is a 29 y.o. female currently breast-feeding, presents for right breast pain and fever.  States this occurred last few days, though worsened today.  Patient is 2 weeks postpartum: C-section healing well.  States that she went from pumping every 2 hours to every 5 hours a few days ago: Unsure if this contributing.  Patient denies nipple retraction, discharge.  No URI symptoms, chest pain, difficulty breathing.  No known sick contacts.   Past Medical History:  Diagnosis Date  . Anemia   . Gestational thrombocytopenia (HCC)   . Inguinal hernia 2019  . Scoliosis 2006    Patient Active Problem List   Diagnosis Date Noted  . Postoperative ileus (HCC) 11/08/2019  . Chorioamnionitis, delivered, current hospitalization 11/06/2019  . Mild preeclampsia 11/06/2019  . Desires VBAC (vaginal birth after cesarean) trial 11/05/2019  . Gestational thrombocytopenia without hemorrhage in third trimester (HCC) 08/22/2019  . Supervision of high risk pregnancy, antepartum 04/13/2019  . History of cesarean delivery 04/13/2019    Past Surgical History:  Procedure Laterality Date  . CESAREAN SECTION    . CESAREAN SECTION  11/06/2019   Procedure: CESAREAN SECTION;  Surgeon: Warden Fillers, MD;  Location: [facility] LD ORS;  Service: Obstetrics;;    OB History    Gravida  3   Para  2   Term  2   Preterm      AB  1   Living  2     SAB  1   TAB      Ectopic      Multiple  0   Live Births  2            Home Medications    Prior to Admission medications   Medication Sig Start Date End Date Taking? Authorizing Provider  acetaminophen (TYLENOL) 500 MG tablet Take 2 tablets (1,000 mg total) by mouth every 8 (eight) hours. 11/10/19   Sheila Oats, MD  amLODipine (NORVASC) 5 MG tablet  Take 1 tablet (5 mg total) by mouth daily. 11/11/19   Sheila Oats, MD  cephALEXin (KEFLEX) 500 MG capsule Take 1 capsule (500 mg total) by mouth 4 (four) times daily for 7 days. 11/21/19 11/28/19  Hall-Potvin, [place], PA-C  Prenatal Vit-Fe Fumarate-FA (MULTIVITAMIN-PRENATAL) 27-0.8 MG TABS tablet Take 1 tablet by mouth daily at 12 noon.    [provider]    Family History Family History  Problem Relation Age of Onset  . Asthma Mother   . Asthma Brother   . Diabetes Maternal Grandmother     Social History Social History   Tobacco Use  . Smoking status: Never Smoker  . Smokeless tobacco: Never Used  Vaping Use  . Vaping Use: Never used  Substance Use Topics  . Alcohol use: Never  . Drug use: Never     Allergies   Patient has no known allergies.   Review of Systems As per HPI   Physical Exam Triage Vital Signs ED Triage Vitals  Enc Vitals Group     BP 11/21/19 1833 123/76     Pulse Rate 11/21/19 1833 (!) 104     Resp 11/21/19 1833 18     Temp 11/21/19 1833 (!) 101 F (38.3 C)  Temp Source 11/21/19 1833 Oral     SpO2 11/21/19 1833 97 %     Weight --      Height --      Head Circumference --      Peak Flow --      Pain Score 11/21/19 1834 6     Pain Loc --      Pain Edu? --      Excl. in GC? --    No data found.  Updated Vital Signs BP 123/76 (BP Location: Right Arm)   Pulse (!) 104   Temp 98.6 F (37 C) (Oral)   Resp 18   SpO2 97%   Visual Acuity Right Eye Distance:   Left Eye Distance:   Bilateral Distance:    Right Eye Near:   Left Eye Near:    Bilateral Near:     Physical Exam Constitutional:      General: She is not in acute distress. HENT:     Head: Normocephalic and atraumatic.  Eyes:     General: No scleral icterus.    Pupils: Pupils are equal, round, and reactive to light.  Cardiovascular:     Rate and Rhythm: Normal rate.  Pulmonary:     Effort: Pulmonary effort is normal.  Chest:    Skin:    Coloration:  Skin is not jaundiced or pale.  Neurological:     Mental Status: She is alert and oriented to person, place, and time.      UC Treatments / Results  Labs (all labs ordered are listed, but only abnormal results are displayed) Labs Reviewed - No data to display  EKG   Radiology No results found.  Procedures Procedures (including critical care time)  Medications Ordered in UC Medications  acetaminophen (TYLENOL) tablet 650 mg (650 mg Oral Given 11/21/19 1839)    Initial Impression / Assessment and Plan / UC Course  I have reviewed the triage vital signs and the nursing notes.  Pertinent labs & imaging results that were available during my care of the patient were reviewed by me and considered in my medical decision making (see chart for details).     Patient febrile, though nontoxic in office today.  Discussed risk factors for mastitis including rapid taper of breast-feeding.  Given fever, chills, will cover for infective mastitis, have patient follow-up with OB/GYN later this week.  Return precautions discussed, pt verbalized understanding and is agreeable to plan. Final Clinical Impressions(s) / UC Diagnoses   Final diagnoses:  Mastitis in female     Discharge Instructions     Keep area(s) clean and dry. Apply hot compress / towel for 5-10 minutes 3-5 times daily. Take antibiotic as prescribed with food - important to complete course. Return for worsening pain, redness, swelling, discharge, fever.    ED Prescriptions    Medication Sig Dispense Auth. Provider   cephALEXin (KEFLEX) 500 MG capsule Take 1 capsule (500 mg total) by mouth 4 (four) times daily for 7 days. 28 capsule Hall-Potvin, [place], PA-C     PDMP not reviewed this encounter.   Odette Fraction [place], [place] 11/21/19 1922

## 2019-11-21 NOTE — ED Triage Notes (Signed)
Pt here for right breast pain and fever; pt is 2 weeks post partum csection and is pumping

## 2019-11-21 NOTE — BH Specialist Note (Signed)
Integrated Behavioral Health via Telemedicine Video (Caregility) Visit  11/21/2019 [place] [id number]  Number of Integrated Behavioral Health visits: 6  Session Start time: 1:14  Session End time: 1:41 Total time: 27  Referring Provider: Candelaria Celeste, DO Type of Visit: Video Patient/Family location: Home [facility] Provider location: [facility]  All persons participating in visit: Patient Michelle Velez and [facility] Hulda Marin    Discussed confidentiality: at previous visit  I connected with [name] by a video enabled telemedicine application (Caregility) and verified that I am speaking with the correct person using two identifiers.    I discussed that engaging in this virtual visit, they consent to the provision of behavioral healthcare and the services will be billed under their insurance.   Patient and/or legal guardian expressed understanding and consented to virtual visit: Yes   PRESENTING CONCERNS: Patient and/or family reports the following symptoms/concerns: Pt states her primary goal today is to find more balance, as she adjusts to the changing demands of a two-child family.  Duration of problem: Postpartum; Severity of problem: mild  STRENGTHS (Protective Factors/Coping Skills): Supportive family; utilizes self-coping strategies as needed  GOALS ADDRESSED: Patient will: 1.  Reduce symptoms of: anxiety and stress  2.  Increase knowledge and/or ability of: stress reduction  3.  Demonstrate ability to: Increase healthy adjustment to current life circumstances  INTERVENTIONS: Interventions utilized:  Solution-Focused Strategies Standardized Assessments completed: GAD-7 and PHQ 9  ASSESSMENT: Patient currently experiencing Anxiety disorder, unspecified.   Patient may benefit from continued psychoeducation and brief therapeutic interventions regarding coping with symptoms of anxiety and  stress .  PLAN: 1. Follow up with behavioral health clinician on : As needed 2. Behavioral recommendations:  -Continue to consider registering for and attending new mom support group at either [email or web address] or www.[email or web address].net -Consider keeping track (for one 24 hour period) of how much time is spent tending to baby (feeding, rocking, changing, holding, getting to sleep). And then be kind to yourself. You are doing the best you can right now. It will get easier.  - Consider simplifying meal preparation (ex: use a crockpot or paper plates) and laundry (ex: put  half of daughter's clothes away, out of her reach).   3. Referral(s): Integrated [employer] (In Clinic)  I discussed the assessment and treatment plan with the patient and/or parent/guardian. They were provided an opportunity to ask questions and all were answered. They agreed with the plan and demonstrated an understanding of the instructions.   They were advised to call back or seek an in-person evaluation if the symptoms worsen or if the condition fails to improve as anticipated.   Confirmed patient's address: Yes  Confirmed patient's phone number: Yes  Any changes to demographics: No   Confirmed patient's insurance: Yes  Any changes to patient's insurance: No   I discussed the limitations of evaluation and management by telemedicine and the availability of in person appointments.  I discussed that the purpose of this visit is to provide behavioral health care while limiting exposure to the novel coronavirus.   Discussed there is a possibility of technology failure and discussed alternative modes of communication if that failure occurs.  [name] Giacomo Valone

## 2019-11-23 ENCOUNTER — Telehealth: Payer: Self-pay

## 2019-11-23 NOTE — Telephone Encounter (Signed)
Called Pt on 11/20/19 & asked her how long this has been going on & she stated just once, asked if she had been constipated & she said no. Advised her to keep an eye out & if it continues to happen go to Hospital, pt verbalized understanding.

## 2019-11-27 ENCOUNTER — Other Ambulatory Visit: Payer: Self-pay

## 2019-11-27 ENCOUNTER — Inpatient Hospital Stay (HOSPITAL_COMMUNITY)
Admission: AD | Admit: 2019-11-27 | Discharge: 2019-11-27 | Disposition: A | Discharge status: Home / Self Care | Payer: 59 | Attending: Obstetrics & Gynecology | Admitting: Obstetrics & Gynecology

## 2019-11-27 DIAGNOSIS — O900 Disruption of cesarean delivery wound: Secondary | ICD-10-CM

## 2019-11-27 NOTE — MAU Provider Note (Signed)
First Provider Initiated Contact with Patient 11/27/19 2132     S Ms. Michelle Velez is a 29 y.o. A1P3790 3 week PP from a rLTCS who presents to MAU today with complaint of portion of incision looking "green". She denies fever, incisional pain, tenderness or discharge from incision. Patient reports that she was just looking at it in the mirror after getting at the shower tonight and noticed that it was green.   O BP 115/73 (BP Location: Right Arm)   Pulse 78   Temp 98.2 F (36.8 C) (Oral)   Resp 17  Physical Exam Vitals and nursing note reviewed.  Constitutional:      Appearance: Normal appearance.  HENT:     Head: Normocephalic.  Cardiovascular:     Rate and Rhythm: Normal rate and regular rhythm.  Pulmonary:     Effort: Pulmonary effort is normal. No respiratory distress.     Breath sounds: Normal breath sounds. No wheezing.  Abdominal:     Palpations: Abdomen is soft.     Tenderness: There is no abdominal tenderness.     Comments: Incision site inspected- dermabond noted and peeling, removed already peeling dermabond to inspect incision under. After removal of dermabond, which was the greenish color she was noticing, no abnormal color, discharge, odor. Incision well approximated, non tender.   Skin:    General: Skin is warm and dry.  Neurological:     Mental Status: She is alert and oriented to person, place, and time.  Psychiatric:        Mood and Affect: Mood normal.        Behavior: Behavior normal.        Thought Content: Thought content normal.    A Medical screening exam complete 1. Postpartum care following cesarean delivery     P Discharge from MAU in stable condition Follow up as scheduled in the office for postpartum care    Sharyon Cable, CNM 11/27/2019 9:53 PM

## 2019-11-27 NOTE — Discharge Instructions (Signed)

## 2019-11-27 NOTE — MAU Note (Signed)
Pt reports to MAU stating she had a c-section on July 26th and she was looking at the scar today and noticed a portion of it looked "green." pt denies fever chills or pain. No other complaints.

## 2019-11-29 ENCOUNTER — Telehealth: Payer: Self-pay | Admitting: *Deleted

## 2019-11-29 NOTE — Telephone Encounter (Signed)
Voicemal message left by Maryelizabeth [facility]. She wanted to provide information to our office regarding pt's emotional state. She stated that she had been to see pt in the home last week and she had scored 10 on the Edinburgh scale. Pt had [facility] visit on 8/9 and next visit is scheduled for 8/24.  Today's Edinburgh score was 9. Pt states she has no feelings of desire for harm to herself or baby. Synetta Fail further stated that pt is aware to go to ED if needed for increased anxiety or negative feelings. Hulda Marin, [facility] was informed of the information contained in this call.

## 2019-12-05 ENCOUNTER — Ambulatory Visit (INDEPENDENT_AMBULATORY_CARE_PROVIDER_SITE_OTHER): Payer: 59 | Admitting: Clinical

## 2019-12-05 DIAGNOSIS — F419 Anxiety disorder, unspecified: Secondary | ICD-10-CM

## 2019-12-05 NOTE — Patient Instructions (Signed)
[facility] [address] [place], [place] [address] [phone] (main office) [phone] (Reshunda Strider's office)  Behavioral Health Resources:   What if I or someone I know is in crisis?  . If you are thinking about harming yourself or having thoughts of suicide, or if you know someone who is, seek help right away.  . Call your doctor or mental health care provider.  . Call 911 or go to a hospital emergency room to get immediate help, or ask a friend or family member to help you do these things.  . Call the [place] National Suicide Prevention Lifeline's toll-free, 24-hour hotline at 1-800-273-TALK (1-800-273-8255) or TTY: 1-800-799-4 TTY (1-800-799-4889) to talk to a trained counselor.  . If you are in crisis, make sure you are not left alone.   . If someone else is in crisis, make sure he or she is not left alone   24 Hour :   [place] National Suicide Hotline: 1-800-273-8255  Therapeutic Alternative Mobile Crisis: 1-877-626-1772   [facility]  [address], [place], [place] [address]  800-711-2635 or 336-832-9700  [facility] Crisis Line (Domestic Violence, Rape & Victim Assistance)  336-273-7273  [facility]  [address] [place], [place]  [address]   1-855-788-8787 or 336-676-6840   [facility] [place] [facility] Services: 336-899-1505 (8am-4pm) or 1-866- 261-5769 (after hours)           [facility], [address], [place], [place]  1-800-711-2635 Fax: [phone] [email or web address]  *Interpreters available *Accepts Medicaid, Medicare, uninsured  [facility]   Mon-Fri: 8am-5pm [address], [place], [place] [phone](phone); [phone](fax) www.[email or web address]  *Accepts Medicare  [facility] Mon, Tues, Thurs, Fri:  8am-4pm [address], [place], [place]  [phone] (phone); [phone] (fax) www.[email or web address]  *Accepts Medicare  [facility] Mon-Fri: 9am-5pm  [address], [place], [place] [phone] (phone); [phone]  www.[email or web address]  *Accepts Medicaid  [facility] [address], [place], [place]  [phone]  [email or web address]://[email or web address]   [facility] Mon-Fri, 8:30am-12pm/1pm-2:30pm [address], [place], [place] [phone] (phone); [phone] (fax) www.fspcares.org  *Accepts Medicaid, sliding-scale*Bilingual services available  [facility] Mon-Fri, 8am-7pm [address], [place], [place]  [phone](phone); [phone](fax) www.[email or web address]  *Accepts Medicaid *Bilingual services available  [facility] Mon-Fri: 8am-5pm, Saturday by appointment only [address], [place], [place] [phone] (phone); [phone] (fax) www.[email or web address]   [facility] [address], [place], [place] [phone] www.[email or web address]  *Free & reduced services for uninsured and underinsured individuals *Bilingual services for Spanish-speaking clients 21 and under  [facility], [address], [place],  [place] [phone](phone); [phone](fax) www.monarchnc.org  *Bring your own interpreter at first visit *Accepts Medicare and Medicaid  [facility] Mon-Fri: 9am-5:30pm [address], [place], [place] [phone] (phone), [phone] (fax) After hours crisis line: 336-763-1165 www.[email or web address]  *Accepts Medicare and Medicaid  [facility] Mon-Thurs, 8am-6pm [address] [place] [address], [place], [place]  [phone] (phone); [phone]  (fax) http://[email or web address]  *Subsidized costs available  [facility] Mon-Fri: 8am-4pm [address], [place], [place] [phone](phone); [phone](fax) www.p[email or web address]  *Accepts Medicaid  [facility] Same day access hours: Mon-Fri, 8:30-3pm Crisis hours: Mon-Fri, 8am-5pm [address], [place], [place]  [facility] Same day access hours: Mon-Fri, 8:30-3pm Crisis hours: Mon-Fri, 8am-8pm [address], , [place] [phone] (phone);   6128324086 (fax) www.[facility]hahealth[facility].org  *Accepts Medicaid and Medicare  [facility], [place], Fri: 9am-9pm Tues, Thurs: 9am-6pm [address] [place], [place], [place]  [phone] (phone); ([phone] (fax) https://[email or web address]  *(Accepts Medicare and Medicaid; payment plans available)*Bilingual services available  [facility] [address], [place], [place] [phone] (phone); (604)[phone] (fax) www.[email or web address]   [facility] [address], [place], [place]  [phone]  RackRewards.[email or web address]  *Bilingual services available  [facility]) Mon-Thurs: 8am-8pm [address], [place], [place] [phone] (phone); [phone] (fax) ScrapbookLive.si  *Accepts Medicaid*Bilingual services available  [facility] [address], [place], [place] [phone] (phone) BrotherBig.[email or web address]  *Accepts Medicaid *Bilingual services available  [facility] Mon-Fri, 9am-4:45pm [address] [place], [place] [phone] (phone); [phone] (fax) http://[email or web address]  *Accepts Medicare  [facility] Mon-Thurs: 8:30-8pm, Fri: 8:30am-7pm [address] [place], [place] (3rd floor) [phone] (phone); [phone]  (fax) https://www.[email or web address]  *Accepts Medicaid; income-based reduced rates available  [facility] Mon-Fri: 8am-5pm [address], [place], [place] [phone] (phone); [phone] (fax) http://[email or web address]  *Accepts Medicaid*Bilingual services available  [facility] [address], [address], [place], [place]  [phone] (phone); 8164654199 (fax) www.youthfocus.org  *Free emergency housing and clinical services for youth in crisis  [facility] ([facility] of [place])  [address] [place] [address], [place] [phone] www.mhag.org  *Provides direct services to individuals in recovery from mental illness, including support groups, recovery skills classes, and one on one peer support  [employer] on Mental Illness) [place] helpline: 8568747345  https://namiguilford.org  *A community hub for information relating to local resources and services for the friends and families of individuals living alongside a mental health condition, as well as the individuals themselves. Classes and support groups also provided

## 2019-12-07 ENCOUNTER — Other Ambulatory Visit (HOSPITAL_COMMUNITY)
Admission: RE | Admit: 2019-12-07 | Discharge: 2019-12-07 | Disposition: A | Discharge status: Home / Self Care | Payer: 59 | Source: Ambulatory Visit

## 2019-12-07 ENCOUNTER — Other Ambulatory Visit: Payer: Self-pay

## 2019-12-07 ENCOUNTER — Ambulatory Visit (INDEPENDENT_AMBULATORY_CARE_PROVIDER_SITE_OTHER): Payer: 59

## 2019-12-07 DIAGNOSIS — Z124 Encounter for screening for malignant neoplasm of cervix: Secondary | ICD-10-CM

## 2019-12-07 DIAGNOSIS — Z98891 History of uterine scar from previous surgery: Secondary | ICD-10-CM

## 2019-12-07 DIAGNOSIS — N61 Mastitis without abscess: Secondary | ICD-10-CM

## 2019-12-07 NOTE — Progress Notes (Signed)
Post Partum Visit Note  Michelle Velez is a 29 y.o. G74P2012 female who presents for a postpartum visit. She is 4 weeks postpartum following a repeat cesarean section.  I have fully reviewed the prenatal and intrapartum course. The delivery was at 40 gestational weeks.  Anesthesia: epidural. Postpartum course has been well. Baby is doing well. Baby is feeding by both breast and bottle - Lucien Mons Start. Bleeding pink staining only. Bowel function is normal. Bladder function is normal. Patient is not sexually active. Contraception method is condoms. Postpartum depression screening: positive. Patient seen behavioral health clinician on 12/05/2019. Patient endorses a history of anxiety and reports some worries/concerns, but states they are not outside her norm.   The pregnancy intention screening data noted above was reviewed. Potential methods of contraception were discussed. The patient elected to proceed with Female Condom.    The following portions of the patient's history were reviewed and updated as appropriate: allergies, current medications, past family history, past medical history, past social history, past surgical history and problem list.  Review of Systems Pertinent items noted in HPI and remainder of comprehensive ROS otherwise negative.     Objective:  unknown if currently breastfeeding.  General:  alert, cooperative and no distress   Breasts:  Not examined  Lungs: clear to auscultation bilaterally  Heart:  regular rate and rhythm  Abdomen: normal findings: soft, non-tender   Vulva:  positive for in-grown hairs on right labia majora. Mildly tender. No inflammation noted.   Vagina: normal vagina with moderate amt blood in vault.   Cervix:  no lesions pap collected via brush and spatula  Corpus: not examined  Adnexa:  not evaluated  Rectal Exam: Not performed.        Assessment:   4 week postpartum exam. Pap smear done at today's visit.  Breastfeeding Anxiety H/O  PreEclampsia H/O Ileus  Plan:  -Reviewed anticipated findings with wound healing including mild tenderness, numbness, and loss of sensation. -Reports appetite is fine.  No constipation despite history of ileus and current hernia (per patient). -Discussed usage of lubrication while breastfeeding with sexual activity. -Instructed to discontinue Norvasc and take blood pressure on Tuesday.  Given information sheet and parameters for elevations that require follow-up. -Reassured that in-grown hairs should resolve without intervention.  Instructed to contact office if symptoms worsen or with onset of new symptoms. -Plan to follow up as appropriate.  -Will continue to see behavioral health as needed.   Essential components of care per ACOG recommendations:  1.  Mood and well being: Patient with negative depression screening today. Reviewed local resources for support.  - Patient does not use tobacco.  - hx of drug use? No   2. Infant care and feeding:  -Patient currently breastmilk feeding? Yes Reviewed importance of draining breast regularly to support lactation. -Social determinants of health (SDOH) reviewed in EPIC. No concerns  3. Sexuality, contraception and birth spacing - Patient does not want a pregnancy in the next year.  Desired family size is 2 children.  - Reviewed forms of contraception in tiered fashion. Patient desired condoms today.   - Discussed birth spacing of 18 months  4. Sleep and fatigue -Encouraged family/partner/community support of 4 hrs of uninterrupted sleep to help with mood and fatigue.  Patient reports 6 hours a day.  5. Physical Recovery  - Discussed patients delivery and complications - Patient has urinary incontinence? No - Patient is safe to resume physical and sexual activity  6.  Health  Maintenance - Last pap smear done today and is pending. No Mammogram d/t age  63. No Chronic Disease - PCP follow up  Cherre Robins, CNM [facility] [employer]

## 2019-12-07 NOTE — Patient Instructions (Signed)
How to Take Your Blood Pressure Blood pressure is a measurement of how strongly your blood is pressing against the walls of your arteries. Arteries are blood vessels that carry blood from your heart throughout your body. Your health care provider takes your blood pressure at each office visit. You can also take your own blood pressure at home with a blood pressure machine. You may need to take your own blood pressure:  To confirm a diagnosis of high blood pressure (hypertension).  To monitor your blood pressure over time.  To make sure your blood pressure medicine is working. Supplies needed: To take your blood pressure, you will need a blood pressure machine. You can buy a blood pressure machine, or blood pressure monitor, at most drugstores or online. There are several types of home blood pressure monitors. When choosing one, consider the following:  Choose a monitor that has an arm cuff.  Choose a cuff that wraps snugly around your upper arm. You should be able to fit only one finger between your arm and the cuff.  Do not choose a monitor that measures your blood pressure from your wrist or finger. Your health care provider can suggest a reliable monitor that will meet your needs. How to prepare To get the most accurate reading, avoid the following for 30 minutes before you check your blood pressure:  Drinking caffeine.  Drinking alcohol.  Eating.  Smoking.  Exercising. Five minutes before you check your blood pressure:  Empty your bladder.  Sit quietly without talking in a dining chair, rather than in a soft couch or armchair. How to take your blood pressure To check your blood pressure, follow the instructions in the manual that came with your blood pressure monitor. If you have a digital blood pressure monitor, the instructions may be as follows: 1. Sit up straight. 2. Place your feet on the floor. Do not cross your ankles or legs. 3. Rest your left arm at the level of  your heart on a table or desk or on the arm of a chair. 4. Pull up your shirt sleeve. 5. Wrap the blood pressure cuff around the upper part of your left arm, 1 inch (2.5 cm) above your elbow. It is best to wrap the cuff around bare skin. 6. Fit the cuff snugly around your arm. You should be able to place only one finger between the cuff and your arm. 7. Position the cord inside the groove of your elbow. 8. Press the power button. 9. Sit quietly while the cuff inflates and deflates. 10. Read the digital reading on the monitor screen and write it down (record it). 11. Wait 2-3 minutes, then repeat the steps, starting at step 1. What does my blood pressure reading mean? A blood pressure reading consists of a higher number over a lower number. Ideally, your blood pressure should be below 120/80. The first ("top") number is called the systolic pressure. It is a measure of the pressure in your arteries as your heart beats. The second ("bottom") number is called the diastolic pressure. It is a measure of the pressure in your arteries as the heart relaxes. Blood pressure is classified into four stages. The following are the stages for adults who do not have a short-term serious illness or a chronic condition. Systolic pressure and diastolic pressure are measured in a unit called mm Hg. Normal  Systolic pressure: below 120.  Diastolic pressure: below 80. Elevated  Systolic pressure: 120-129.  Diastolic pressure: below 80. Hypertension stage   1  Systolic pressure: 130-139.  Diastolic pressure: 80-89. Hypertension stage 2  Systolic pressure: 140 or above.  Diastolic pressure: 90 or above. You can have prehypertension or hypertension even if only the systolic or only the diastolic number in your reading is higher than normal. Follow these instructions at home:  Check your blood pressure as often as recommended by your health care provider.  Take your monitor to the next appointment with your  health care provider to make sure: ? That you are using it correctly. ? That it provides accurate readings.  Be sure you understand what your goal blood pressure numbers are.  Tell your health care provider if you are having any side effects from blood pressure medicine. Contact a health care provider if:  Your blood pressure is consistently high. Get help right away if:  Your systolic blood pressure is higher than 180.  Your diastolic blood pressure is higher than 110. This information is not intended to replace advice given to you by your health care provider. Make sure you discuss any questions you have with your health care provider. Document Revised: 03/12/2017 Document Reviewed: 09/06/2015 Elsevier Patient Education  2020 Elsevier Inc.  

## 2019-12-08 LAB — CYTOLOGY - PAP
Comment: NEGATIVE
Diagnosis: NEGATIVE
High risk HPV: NEGATIVE

## 2020-01-23 ENCOUNTER — Telehealth: Payer: Self-pay

## 2020-01-23 NOTE — Telephone Encounter (Signed)
Pt called requesting a call back because she is having bleeding after sex.    Addison Naegeli, RN  01/23/20

## 2020-01-29 NOTE — Telephone Encounter (Signed)
I called Sera back and left a message I was calling to follow up on her call from last week and apologized for delay in calling her back. I asked her to call and schedule an appointment with provide if she is still having an issue. Ranesha Val,RN

## 2020-02-08 ENCOUNTER — Ambulatory Visit (INDEPENDENT_AMBULATORY_CARE_PROVIDER_SITE_OTHER): Payer: 59 | Admitting: Obstetrics & Gynecology

## 2020-02-08 ENCOUNTER — Encounter: Payer: Self-pay | Admitting: Obstetrics & Gynecology

## 2020-02-08 ENCOUNTER — Other Ambulatory Visit: Payer: Self-pay

## 2020-02-08 VITALS — BP 109/74 | HR 106 | Ht 61.0 in | Wt 118.3 lb

## 2020-02-08 DIAGNOSIS — N921 Excessive and frequent menstruation with irregular cycle: Secondary | ICD-10-CM

## 2020-02-08 MED ORDER — NORETHINDRONE 0.35 MG PO TABS: 1.0000 | ORAL_TABLET | Freq: Every day | ORAL | 11 refills | Status: DC

## 2020-02-08 NOTE — Progress Notes (Signed)
Had a baby 3 months ago Heavy bleeding compared to before baby.

## 2020-02-08 NOTE — Progress Notes (Signed)
Michelle Velez is an 29 y.o. [id number]. Presenting for irregular cycles. Pt had infant in July and is currently breastfeeding. She is not on contraception. Pt using 3-4 heavy pads per day for 7-10 days of cycle, pt also has intermenstrual bleeding as well. Prior to pregnancy cycles we every 28-30 days.   Pertinent Gynecological History: Menses: flow is moderate and with minimal cramping Bleeding: intermenstrual bleeding Contraception: none OB History: G3, P2012   Menstrual History: Menarche age: 5 No LMP recorded.    Past Medical History:  Diagnosis Date  . Anemia   . Gestational thrombocytopenia (HCC)   . Gestational thrombocytopenia without hemorrhage in third trimester (HCC) 08/22/2019   Likely gestational though review of Care Everywhere records shows has intermittently had during non-pregnant times as well Most recent checks 129>115>91>133 on 08/22/2019 Nadir of 74 during admission for last delivery Recheck at 36-37 wks, consider heme consult if again dips below 100k  . History of cesarean delivery 04/13/2019   VBAC consent signed 08/08/2019 Op note in Care Everywhere 03/15/2015  . Inguinal hernia 2019  . Mild preeclampsia 11/06/2019  . Postoperative ileus (HCC) 11/08/2019  . Scoliosis 2006    Past Surgical History:  Procedure Laterality Date  . CESAREAN SECTION    . CESAREAN SECTION  11/06/2019   Procedure: CESAREAN SECTION;  Surgeon: Warden Fillers, MD;  Location: [facility];  Service: Obstetrics;;    Family History  Problem Relation Age of Onset  . Asthma Mother   . Asthma Brother   . Diabetes Maternal Grandmother     Social History:  reports that she has never smoked. She has never used smokeless tobacco. She reports that she does not drink alcohol and does not use drugs.  Allergies: No Known Allergies  (Not in a hospital admission)   Review of Systems  Constitutional: Negative for activity change, appetite change, fatigue and unexpected weight change.  HENT:  Negative.   Eyes: Negative.   Respiratory: Negative.  Negative for chest tightness, shortness of breath and wheezing.   Cardiovascular: Negative.  Negative for chest pain and leg swelling.  Gastrointestinal: Negative.  Negative for abdominal distention, abdominal pain, constipation, diarrhea, nausea and vomiting.  Endocrine: Negative.   Genitourinary: Positive for menstrual problem and vaginal bleeding. Negative for dysuria and hematuria.  Neurological: Negative.  Negative for dizziness, weakness, light-headedness, numbness and headaches.  Hematological: Negative.   Psychiatric/Behavioral: Negative.  Negative for agitation, confusion and decreased concentration.    Blood pressure 109/74, pulse (!) 106, height 5\' 1"  (1.549 m), weight 118 lb 4.8 oz (53.7 kg), unknown if currently breastfeeding. Physical Exam Vitals reviewed.  Constitutional:      Appearance: She is well-developed.  HENT:     Head: Normocephalic and atraumatic.  Eyes:     Pupils: Pupils are equal, round, and reactive to light.  Cardiovascular:     Rate and Rhythm: Normal rate.  Pulmonary:     Effort: Pulmonary effort is normal.  Abdominal:     General: There is no distension.     Palpations: Abdomen is soft.     Tenderness: There is no abdominal tenderness. There is no guarding.  Musculoskeletal:     Cervical back: Normal range of motion.  Skin:    General: Skin is warm and dry.  Neurological:     Mental Status: She is alert and oriented to person, place, and time.  Psychiatric:        Behavior: Behavior normal.     Assessment/Plan: 1. DUB-  like secondary to continuation of breast feeding, will add Nor QD to help regulate cycle. Follow up in 3  Months to assess cycles.  2. Postpartum plans- okay to continue breastfeeding. POP for contraception.   Malachy Chamber 02/08/2020, 3:58 PM

## 2020-02-08 NOTE — Patient Instructions (Signed)

## 2020-02-15 ENCOUNTER — Encounter: Payer: Self-pay | Admitting: *Deleted

## 2020-03-23 ENCOUNTER — Encounter: Payer: Self-pay | Admitting: Emergency Medicine

## 2020-03-23 ENCOUNTER — Ambulatory Visit
Admission: EM | Admit: 2020-03-23 | Discharge: 2020-03-23 | Disposition: A | Discharge status: Home / Self Care | Payer: 59 | Attending: Emergency Medicine | Admitting: Emergency Medicine

## 2020-03-23 ENCOUNTER — Other Ambulatory Visit: Payer: Self-pay

## 2020-03-23 DIAGNOSIS — Z79899 Other long term (current) drug therapy: Secondary | ICD-10-CM | POA: Diagnosis not present

## 2020-03-23 DIAGNOSIS — Z113 Encounter for screening for infections with a predominantly sexual mode of transmission: Secondary | ICD-10-CM | POA: Insufficient documentation

## 2020-03-23 DIAGNOSIS — N898 Other specified noninflammatory disorders of vagina: Secondary | ICD-10-CM | POA: Insufficient documentation

## 2020-03-23 DIAGNOSIS — R1030 Lower abdominal pain, unspecified: Secondary | ICD-10-CM | POA: Diagnosis not present

## 2020-03-23 NOTE — ED Provider Notes (Signed)
[name]    CSN: [id number] Arrival date & time: 03/23/20  0845      History   Chief Complaint Chief Complaint  Patient presents with  . Vaginal Discharge    HPI Michelle Velez is a 29 y.o. female presenting today for evaluation of vaginal discharge.  Patient reports that she has had discharge for approximately 2 weeks with associated odor.  Reports similar to past BV infections.  Also reports some lower abdominal discomfort.  Unsure if this is related to constipation.  Reports last bowel movement approximately 2 days ago.  Denies any nausea or vomiting.  Denies urinary symptoms of urinary frequency, dysuria.  Denies specific concerns for STDs.  Is currently breast-feeding, approximately 3 to 4 months postpartum.  HPI  Past Medical History:  Diagnosis Date  . Anemia   . Gestational thrombocytopenia (HCC)   . Gestational thrombocytopenia without hemorrhage in third trimester (HCC) 08/22/2019   Likely gestational though review of Care Everywhere records shows has intermittently had during non-pregnant times as well Most recent checks 129>115>91>133 on 08/22/2019 Nadir of 74 during admission for last delivery Recheck at 36-37 wks, consider heme consult if again dips below 100k  . History of cesarean delivery 04/13/2019   VBAC consent signed 08/08/2019 Op note in Care Everywhere 03/15/2015  . Inguinal hernia 2019  . Mild preeclampsia 11/06/2019  . Postoperative ileus (HCC) 11/08/2019  . Scoliosis 2006    There are no problems to display for this patient.   Past Surgical History:  Procedure Laterality Date  . CESAREAN SECTION    . CESAREAN SECTION  11/06/2019   Procedure: CESAREAN SECTION;  Surgeon: Warden Fillers, MD;  Location: [facility];  Service: Obstetrics;;    OB History    Gravida  3   Para  2   Term  2   Preterm      AB  1   Living  2     SAB  1   IAB      Ectopic      Multiple  0   Live Births  2            Home Medications     Prior to Admission medications   Medication Sig Start Date End Date Taking? Authorizing Provider  acetaminophen (TYLENOL) 500 MG tablet Take 2 tablets (1,000 mg total) by mouth every 8 (eight) hours. 11/10/19   Sheila Oats, MD  amLODipine (NORVASC) 5 MG tablet Take 1 tablet (5 mg total) by mouth daily. 11/11/19   Sheila Oats, MD  norethindrone (MICRONOR) 0.35 MG tablet Take 1 tablet (0.35 mg total) by mouth daily. 02/08/20   Malachy Chamber, MD  Prenatal Vit-Fe Fumarate-FA (MULTIVITAMIN-PRENATAL) 27-0.8 MG TABS tablet Take 1 tablet by mouth daily at 12 noon.    [provider]    Family History Family History  Problem Relation Age of Onset  . Asthma Mother   . Asthma Brother   . Diabetes Maternal Grandmother     Social History Social History   Tobacco Use  . Smoking status: Never Smoker  . Smokeless tobacco: Never Used  Vaping Use  . Vaping Use: Never used  Substance Use Topics  . Alcohol use: Never  . Drug use: Never     Allergies   Patient has no known allergies.   Review of Systems Review of Systems  Constitutional: Negative for fever.  Respiratory: Negative for shortness of breath.   Cardiovascular: Negative for chest  pain.  Gastrointestinal: Negative for abdominal pain, diarrhea, nausea and vomiting.  Genitourinary: Positive for vaginal discharge. Negative for dysuria, flank pain, genital sores, hematuria, menstrual problem, vaginal bleeding and vaginal pain.  Musculoskeletal: Negative for back pain.  Skin: Negative for rash.  Neurological: Negative for dizziness, light-headedness and headaches.     Physical Exam Triage Vital Signs ED Triage Vitals  Enc Vitals Group     BP 03/23/20 0858 98/63     Pulse Rate 03/23/20 0858 78     Resp 03/23/20 0858 16     Temp 03/23/20 0858 98 F (36.7 C)     Temp Source 03/23/20 0858 Oral     SpO2 03/23/20 0858 96 %     Weight --      Height --      Head Circumference --      Peak Flow --       Pain Score 03/23/20 0904 0     Pain Loc --      Pain Edu? --      Excl. in GC? --    No data found.  Updated Vital Signs BP 98/63 (BP Location: Left Arm)   Pulse 78   Temp 98 F (36.7 C) (Oral)   Resp 16   SpO2 96%   Visual Acuity Right Eye Distance:   Left Eye Distance:   Bilateral Distance:    Right Eye Near:   Left Eye Near:    Bilateral Near:     Physical Exam Vitals and nursing note reviewed.  Constitutional:      Appearance: She is well-developed and well-nourished.     Comments: No acute distress  HENT:     Head: Normocephalic and atraumatic.     Nose: Nose normal.  Eyes:     Conjunctiva/sclera: Conjunctivae normal.  Cardiovascular:     Rate and Rhythm: Normal rate.  Pulmonary:     Effort: Pulmonary effort is normal. No respiratory distress.  Abdominal:     General: There is no distension.     Comments: Soft, nondistended, nontender to palpation to bilateral lower quadrants, slightly more prominent on right, negative rebound, negative McBurney's  Musculoskeletal:        General: Normal range of motion.     Cervical back: Neck supple.  Skin:    General: Skin is warm and dry.  Neurological:     Mental Status: She is alert and oriented to person, place, and time.  Psychiatric:        Mood and Affect: Mood and affect normal.      UC Treatments / Results  Labs (all labs ordered are listed, but only abnormal results are displayed) Labs Reviewed  CERVICOVAGINAL ANCILLARY ONLY    EKG   Radiology No results found.  Procedures Procedures (including critical care time)  Medications Ordered in UC Medications - No data to display  Initial Impression / Assessment and Plan / UC Course  I have reviewed the triage vital signs and the nursing notes.  Pertinent labs & imaging results that were available during my care of the patient were reviewed by me and considered in my medical decision making (see chart for details).     Vaginal swab pending for  screening of cause of discharge.  Will call with results and provide treatment based off results, higher suspicion of BV, but patient would like to defer empiric treatment.  Discussed lifestyle recommendations to help with bowel regulation of fluids, fiber, activity.  Monitor for improvement of  pain with passing bowels/treatment of vaginal discharge.  Low suspicion of appendicitis or intra-abdominal emergency at this time.  Continue to monitor.  Discussed strict return precautions. Patient verbalized understanding and is agreeable with plan.  Final Clinical Impressions(s) / UC Diagnoses   Final diagnoses:  Vaginal discharge     Discharge Instructions     Drink plenty of fluids, Fiber in diet  We are testing you for Gonorrhea, Chlamydia, Trichomonas, Yeast and Bacterial Vaginosis. We will call you if anything is positive and let you know if you require any further treatment. Please inform partners of any positive results.   Please return if symptoms not improving with treatment, development of fever, nausea, vomiting, abdominal pain.     ED Prescriptions    None     PDMP not reviewed this encounter.   Lew Dawes, [place] 03/23/20 [phone]

## 2020-03-23 NOTE — Discharge Instructions (Signed)
Drink plenty of fluids, Fiber in diet  We are testing you for Gonorrhea, Chlamydia, Trichomonas, Yeast and Bacterial Vaginosis. We will call you if anything is positive and let you know if you require any further treatment. Please inform partners of any positive results.   Please return if symptoms not improving with treatment, development of fever, nausea, vomiting, abdominal pain.

## 2020-03-23 NOTE — ED Triage Notes (Signed)
Pt here with vaginal discharge x 2 weeks now with odor; pt concerned for BV

## 2020-03-26 ENCOUNTER — Telehealth (HOSPITAL_COMMUNITY): Payer: Self-pay | Admitting: Emergency Medicine

## 2020-03-26 LAB — CERVICOVAGINAL ANCILLARY ONLY
Bacterial Vaginitis (gardnerella): POSITIVE — AB
Candida Glabrata: NEGATIVE
Candida Vaginitis: NEGATIVE
Chlamydia: NEGATIVE
Comment: NEGATIVE
Comment: NEGATIVE
Comment: NEGATIVE
Comment: NEGATIVE
Comment: NEGATIVE
Comment: NORMAL
Neisseria Gonorrhea: NEGATIVE
Trichomonas: NEGATIVE

## 2020-03-26 MED ORDER — METRONIDAZOLE 0.75 % VA GEL: 1.0000 | Freq: Every day | VAGINAL | 0 refills | Status: AC

## 2020-07-07 ENCOUNTER — Ambulatory Visit
Admission: EM | Admit: 2020-07-07 | Discharge: 2020-07-07 | Disposition: A | Discharge status: Home / Self Care | Payer: 59 | Attending: Physician Assistant | Admitting: Physician Assistant

## 2020-07-07 ENCOUNTER — Other Ambulatory Visit: Payer: Self-pay

## 2020-07-07 DIAGNOSIS — N898 Other specified noninflammatory disorders of vagina: Secondary | ICD-10-CM | POA: Diagnosis not present

## 2020-07-07 LAB — POCT URINALYSIS DIP (MANUAL ENTRY)
Bilirubin, UA: NEGATIVE
Blood, UA: NEGATIVE
Glucose, UA: NEGATIVE mg/dL
Ketones, POC UA: NEGATIVE mg/dL
Nitrite, UA: NEGATIVE
Protein Ur, POC: NEGATIVE mg/dL
Spec Grav, UA: >=1.03 — AB (ref 1.010–1.025)
Urobilinogen, UA: 0.2 E.U./dL
pH, UA: 5.5 (ref 5.0–8.0)

## 2020-07-07 MED ORDER — FLUCONAZOLE 150 MG PO TABS
150.0000 mg | ORAL_TABLET | Freq: Every day | ORAL | 1 refills | Status: DC
Start: 2020-07-07 — End: 2021-06-30

## 2020-07-07 NOTE — ED Triage Notes (Signed)
PT PRESETS WITH C/O VAGINAL IRRITATION, HAS H/O BV

## 2020-07-07 NOTE — Discharge Instructions (Signed)
Your test are pending 

## 2020-07-07 NOTE — ED Provider Notes (Signed)
[name]    CSN: [id number] Arrival date & time: 07/07/20  1103      History   Chief Complaint Chief Complaint  Patient presents with  . Vaginal Itching    HPI Michelle Velez is a 30 y.o. female.   The history is provided by the patient. No language interpreter was used.  Vaginal Itching This is a new problem. The current episode started yesterday. The problem occurs constantly. The problem has not changed since onset.Nothing aggravates the symptoms. Nothing relieves the symptoms. She has tried nothing for the symptoms. The treatment provided no relief.    Past Medical History:  Diagnosis Date  . Anemia   . Gestational thrombocytopenia (HCC)   . Gestational thrombocytopenia without hemorrhage in third trimester (HCC) 08/22/2019   Likely gestational though review of Care Everywhere records shows has intermittently had during non-pregnant times as well Most recent checks 129>115>91>133 on 08/22/2019 Nadir of 74 during admission for last delivery Recheck at 36-37 wks, consider heme consult if again dips below 100k  . History of cesarean delivery 04/13/2019   VBAC consent signed 08/08/2019 Op note in Care Everywhere 03/15/2015  . Inguinal hernia 2019  . Mild preeclampsia 11/06/2019  . Postoperative ileus (HCC) 11/08/2019  . Scoliosis 2006    There are no problems to display for this patient.   Past Surgical History:  Procedure Laterality Date  . CESAREAN SECTION    . CESAREAN SECTION  11/06/2019   Procedure: CESAREAN SECTION;  Surgeon: Warden Fillers, MD;  Location: [facility];  Service: Obstetrics;;    OB History    Gravida  3   Para  2   Term  2   Preterm      AB  1   Living  2     SAB  1   IAB      Ectopic      Multiple  0   Live Births  2            Home Medications    Prior to Admission medications   Medication Sig Start Date End Date Taking? Authorizing Provider  fluconazole (DIFLUCAN) 150 MG tablet Take 1 tablet (150 mg  total) by mouth daily. 07/07/20  Yes Elson Areas, PA-C  acetaminophen (TYLENOL) 500 MG tablet Take 2 tablets (1,000 mg total) by mouth every 8 (eight) hours. 11/10/19   Sheila Oats, MD  amLODipine (NORVASC) 5 MG tablet Take 1 tablet (5 mg total) by mouth daily. 11/11/19   Sheila Oats, MD  norethindrone (MICRONOR) 0.35 MG tablet Take 1 tablet (0.35 mg total) by mouth daily. 02/08/20   Malachy Chamber, MD  Prenatal Vit-Fe Fumarate-FA (MULTIVITAMIN-PRENATAL) 27-0.8 MG TABS tablet Take 1 tablet by mouth daily at 12 noon.    [provider]    Family History Family History  Problem Relation Age of Onset  . Asthma Mother   . Asthma Brother   . Diabetes Maternal Grandmother     Social History Social History   Tobacco Use  . Smoking status: Never Smoker  . Smokeless tobacco: Never Used  Vaping Use  . Vaping Use: Never used  Substance Use Topics  . Alcohol use: Never  . Drug use: Never     Allergies   Patient has no known allergies.   Review of Systems Review of Systems  All other systems reviewed and are negative.    Physical Exam Triage Vital Signs ED Triage Vitals [07/07/20 1158]  Enc Vitals Group     BP 102/65     Pulse Rate 71     Resp 20     Temp 98 F (36.7 C)     Temp Source Oral     SpO2 98 %     Weight      Height      Head Circumference      Peak Flow      Pain Score      Pain Loc      Pain Edu?      Excl. in GC?    No data found.  Updated Vital Signs BP 102/65 (BP Location: Left Arm)   Pulse 71   Temp 98 F (36.7 C) (Oral)   Resp 20   SpO2 98%   Visual Acuity Right Eye Distance:   Left Eye Distance:   Bilateral Distance:    Right Eye Near:   Left Eye Near:    Bilateral Near:     Physical Exam Vitals reviewed.  Cardiovascular:     Rate and Rhythm: Normal rate.  Pulmonary:     Effort: Pulmonary effort is normal.  Skin:    General: Skin is warm.  Neurological:     General: No focal deficit present.     Mental  Status: She is alert.  Psychiatric:        Mood and Affect: Mood normal.      UC Treatments / Results  Labs (all labs ordered are listed, but only abnormal results are displayed) Labs Reviewed  POCT URINALYSIS DIP (MANUAL ENTRY) - Abnormal; Notable for the following components:      Result Value   Spec Grav, UA >=1.030 (*)    Leukocytes, UA Trace (*)    All other components within normal limits  CERVICOVAGINAL ANCILLARY ONLY    EKG   Radiology No results found.  Procedures Procedures (including critical care time)  Medications Ordered in UC Medications - No data to display  Initial Impression / Assessment and Plan / UC Course  I have reviewed the triage vital signs and the nursing notes.  Pertinent labs & imaging results that were available during my care of the patient were reviewed by me and considered in my medical decision making (see chart for details).     MDM:  Pt did self swab.  Labs pending Final Clinical Impressions(s) / UC Diagnoses   Final diagnoses:  Vaginal irritation     Discharge Instructions     Your test are pending.     ED Prescriptions    Medication Sig Dispense Auth. Provider   fluconazole (DIFLUCAN) 150 MG tablet Take 1 tablet (150 mg total) by mouth daily. 1 tablet Elson Areas, [place]     PDMP not reviewed this encounter.  An After Visit Summary was printed and given to the patient.    Elson Areas, [place] 07/07/20 1238

## 2020-07-08 LAB — CERVICOVAGINAL ANCILLARY ONLY
Bacterial Vaginitis (gardnerella): POSITIVE — AB
Candida Glabrata: NEGATIVE
Candida Vaginitis: POSITIVE — AB
Chlamydia: NEGATIVE
Comment: NEGATIVE
Comment: NEGATIVE
Comment: NEGATIVE
Comment: NEGATIVE
Comment: NEGATIVE
Comment: NORMAL
Neisseria Gonorrhea: NEGATIVE
Trichomonas: NEGATIVE

## 2020-07-09 ENCOUNTER — Telehealth (HOSPITAL_COMMUNITY): Payer: Self-pay | Admitting: Emergency Medicine

## 2020-07-09 MED ORDER — METRONIDAZOLE 0.75 % VA GEL: 1.0000 | Freq: Every day | VAGINAL | 0 refills | Status: AC

## 2020-11-27 ENCOUNTER — Ambulatory Visit
Admission: EM | Admit: 2020-11-27 | Discharge: 2020-11-27 | Disposition: A | Discharge status: Home / Self Care | Payer: 59 | Attending: Internal Medicine | Admitting: Internal Medicine

## 2020-11-27 ENCOUNTER — Encounter: Payer: Self-pay | Admitting: Emergency Medicine

## 2020-11-27 ENCOUNTER — Other Ambulatory Visit: Payer: Self-pay

## 2020-11-27 DIAGNOSIS — N3000 Acute cystitis without hematuria: Secondary | ICD-10-CM | POA: Diagnosis present

## 2020-11-27 DIAGNOSIS — R300 Dysuria: Secondary | ICD-10-CM | POA: Insufficient documentation

## 2020-11-27 LAB — POCT URINALYSIS DIP (MANUAL ENTRY)
Bilirubin, UA: NEGATIVE
Blood, UA: NEGATIVE
Glucose, UA: NEGATIVE mg/dL
Ketones, POC UA: NEGATIVE mg/dL
Nitrite, UA: POSITIVE — AB
Spec Grav, UA: 1.015 (ref 1.010–1.025)
Urobilinogen, UA: 0.2 E.U./dL
pH, UA: 7 (ref 5.0–8.0)

## 2020-11-27 MED ORDER — AMOXICILLIN-POT CLAVULANATE 875-125 MG PO TABS: 1.0000 | ORAL_TABLET | Freq: Two times a day (BID) | ORAL | 0 refills | Status: DC

## 2020-11-27 NOTE — ED Provider Notes (Signed)
[name]    CSN: [id number] Arrival date & time: 11/27/20  1707      History   Chief Complaint Chief Complaint  Patient presents with   Possible UTI    HPI Michelle Velez is a 30 y.o. female.   Patient presents with urinary burning that has been present for a few days.  Denies any urinary frequency, hematuria, fever, abdominal pain.  Is having some right-sided lower back pain.  Patient has been taking Azo over-the-counter with some relief of burning.  States that she had a urinary tract infection at the end of July.  Patient was in [place] at that time and was treated with an antibiotic that she does not know the name of.  Symptoms resolved while on antibiotic but returned a few days ago.  Denies any known exposure to STD.    Past Medical History:  Diagnosis Date   Anemia    Gestational thrombocytopenia (HCC)    Gestational thrombocytopenia without hemorrhage in third trimester (HCC) 08/22/2019   Likely gestational though review of Care Everywhere records shows has intermittently had during non-pregnant times as well Most recent checks 129>115>91>133 on 08/22/2019 Nadir of 74 during admission for last delivery Recheck at 36-37 wks, consider heme consult if again dips below 100k   History of cesarean delivery 04/13/2019   VBAC consent signed 08/08/2019 Op note in Care Everywhere 03/15/2015   Inguinal hernia 2019   Mild preeclampsia 11/06/2019   Postoperative ileus (HCC) 11/08/2019   Scoliosis 2006    There are no problems to display for this patient.   Past Surgical History:  Procedure Laterality Date   CESAREAN SECTION     CESAREAN SECTION  11/06/2019   Procedure: CESAREAN SECTION;  Surgeon: Warden Fillers, MD;  Location: [facility];  Service: Obstetrics;;    OB History     Gravida  3   Para  2   Term  2   Preterm      AB  1   Living  2      SAB  1   IAB      Ectopic      Multiple  0   Live Births  2            Home Medications     Prior to Admission medications   Medication Sig Start Date End Date Taking? Authorizing Provider  acetaminophen (TYLENOL) 500 MG tablet Take 2 tablets (1,000 mg total) by mouth every 8 (eight) hours. 11/10/19  Yes Sheila Oats, MD  amoxicillin-clavulanate (AUGMENTIN) 875-125 MG tablet Take 1 tablet by mouth every 12 (twelve) hours. 11/27/20  Yes Lance Muss, FNP  amLODipine (NORVASC) 5 MG tablet Take 1 tablet (5 mg total) by mouth daily. 11/11/19   Sheila Oats, MD  fluconazole (DIFLUCAN) 150 MG tablet Take 1 tablet (150 mg total) by mouth daily. 07/07/20   Elson Areas, PA-C  norethindrone (MICRONOR) 0.35 MG tablet Take 1 tablet (0.35 mg total) by mouth daily. 02/08/20   Malachy Chamber, MD  Prenatal Vit-Fe Fumarate-FA (MULTIVITAMIN-PRENATAL) 27-0.8 MG TABS tablet Take 1 tablet by mouth daily at 12 noon.    [provider]    Family History Family History  Problem Relation Age of Onset   Asthma Mother    Asthma Brother    Diabetes Maternal Grandmother     Social History Social History   Tobacco Use   Smoking status: Never   Smokeless tobacco: Never  Vaping Use   Vaping Use: Never used  Substance Use Topics   Alcohol use: Never   Drug use: Never     Allergies   Patient has no known allergies.   Review of Systems Review of Systems Per HPI  Physical Exam Triage Vital Signs ED Triage Vitals  Enc Vitals Group     BP 11/27/20 1716 101/67     Pulse Rate 11/27/20 1716 93     Resp 11/27/20 1716 16     Temp 11/27/20 1716 98.3 F (36.8 C)     Temp Source 11/27/20 1716 Oral     SpO2 11/27/20 1716 97 %     Weight 11/27/20 1718 113 lb (51.3 kg)     Height 11/27/20 1718 5\' 2"  (1.575 m)     Head Circumference --      Peak Flow --      Pain Score 11/27/20 1718 5     Pain Loc --      Pain Edu? --      Excl. in GC? --    No data found.  Updated Vital Signs BP 101/67 (BP Location: Left Arm)   Pulse 93   Temp 98.3 F (36.8 C) (Oral)   Resp 16    Ht 5\' 2"  (1.575 m)   Wt 113 lb (51.3 kg)   LMP 10/29/2020   SpO2 97%   BMI 20.67 kg/m   Visual Acuity Right Eye Distance:   Left Eye Distance:   Bilateral Distance:    Right Eye Near:   Left Eye Near:    Bilateral Near:     Physical Exam Constitutional:      Appearance: Normal appearance.  HENT:     Head: Normocephalic and atraumatic.  Eyes:     Extraocular Movements: Extraocular movements intact.     Conjunctiva/sclera: Conjunctivae normal.  Cardiovascular:     Rate and Rhythm: Normal rate and regular rhythm.     Pulses: Normal pulses.     Heart sounds: Normal heart sounds.  Pulmonary:     Effort: Pulmonary effort is normal.  Abdominal:     General: Abdomen is flat. Bowel sounds are normal. There is no distension.     Palpations: Abdomen is soft.     Tenderness: There is no abdominal tenderness.  Musculoskeletal:     Comments: No tenderness to palpation to back.  Skin:    General: Skin is warm and dry.  Neurological:     General: No focal deficit present.     Mental Status: She is alert and oriented to person, place, and time. Mental status is at baseline.  Psychiatric:        Mood and Affect: Mood normal.        Behavior: Behavior normal.        Thought Content: Thought content normal.        Judgment: Judgment normal.     UC Treatments / Results  Labs (all labs ordered are listed, but only abnormal results are displayed) Labs Reviewed  POCT URINALYSIS DIP (MANUAL ENTRY) - Abnormal; Notable for the following components:      Result Value   Color, UA orange (*)    Protein Ur, POC trace (*)    Nitrite, UA Positive (*)    Leukocytes, UA Trace (*)    All other components within normal limits  URINE CULTURE    EKG   Radiology No results found.  Procedures Procedures (including critical care time)  Medications Ordered in  UC Medications - No data to display  Initial Impression / Assessment and Plan / UC Course  I have reviewed the triage vital  signs and the nursing notes.  Pertinent labs & imaging results that were available during my care of the patient were reviewed by me and considered in my medical decision making (see chart for details).     Urinalysis showing signs of urinary tract infection.  Will treat with Augmentin due to patient currently breastfeeding.  Urine culture is pending.  Patient to increase clear oral fluid intake.  Advised patient to go to the hospital if symptoms worsen.Discussed strict return precautions. Patient verbalized understanding and is agreeable with plan.  Final Clinical Impressions(s) / UC Diagnoses   Final diagnoses:  Dysuria  Acute cystitis without hematuria     Discharge Instructions      Your urine shows signs of urinary tract infection.  Urine culture is pending.  We will call if this is positive.  You have been prescribed Augmentin antibiotic to treat urinary tract infection.  This is safe with breast-feeding.     ED Prescriptions     Medication Sig Dispense Auth. Provider   amoxicillin-clavulanate (AUGMENTIN) 875-125 MG tablet Take 1 tablet by mouth every 12 (twelve) hours. 14 tablet Lance Muss, FNP      PDMP not reviewed this encounter.   Lance Muss, FNP 11/27/20 1805

## 2020-11-27 NOTE — ED Triage Notes (Signed)
Patient c/o dysuria, no hematuria, frequency, urgency, no fever, headache.  Patient has been taken AZO.

## 2020-11-27 NOTE — Discharge Instructions (Addendum)
Your urine shows signs of urinary tract infection.  Urine culture is pending.  We will call if this is positive.  You have been prescribed Augmentin antibiotic to treat urinary tract infection.  This is safe with breast-feeding.

## 2020-11-30 LAB — URINE CULTURE: Culture: >=100000 — AB

## 2020-12-02 ENCOUNTER — Telehealth (HOSPITAL_COMMUNITY): Payer: Self-pay | Admitting: Emergency Medicine

## 2020-12-02 MED ORDER — CIPROFLOXACIN HCL 250 MG PO TABS: 250.0000 mg | ORAL_TABLET | Freq: Two times a day (BID) | ORAL | 0 refills | Status: AC

## 2020-12-29 ENCOUNTER — Emergency Department (HOSPITAL_BASED_OUTPATIENT_CLINIC_OR_DEPARTMENT_OTHER)
Admission: EM | Admit: 2020-12-29 | Discharge: 2020-12-29 | Disposition: A | Discharge status: Home / Self Care | Payer: 59 | Attending: Emergency Medicine | Admitting: Emergency Medicine

## 2020-12-29 ENCOUNTER — Encounter (HOSPITAL_BASED_OUTPATIENT_CLINIC_OR_DEPARTMENT_OTHER): Payer: Self-pay | Admitting: Emergency Medicine

## 2020-12-29 ENCOUNTER — Other Ambulatory Visit: Payer: Self-pay

## 2020-12-29 ENCOUNTER — Emergency Department (HOSPITAL_BASED_OUTPATIENT_CLINIC_OR_DEPARTMENT_OTHER): Payer: 59

## 2020-12-29 DIAGNOSIS — R1030 Lower abdominal pain, unspecified: Secondary | ICD-10-CM

## 2020-12-29 DIAGNOSIS — R1031 Right lower quadrant pain: Secondary | ICD-10-CM | POA: Diagnosis present

## 2020-12-29 LAB — COMPREHENSIVE METABOLIC PANEL
ALT: 12 U/L (ref 0–44)
AST: 18 U/L (ref 15–41)
Albumin: 4.3 g/dL (ref 3.5–5.0)
Alkaline Phosphatase: 109 U/L (ref 38–126)
Anion gap: 8 (ref 5–15)
BUN: 14 mg/dL (ref 6–20)
CO2: 27 mmol/L (ref 22–32)
Calcium: 9.9 mg/dL (ref 8.9–10.3)
Chloride: 102 mmol/L (ref 98–111)
Creatinine, Ser: 0.52 mg/dL (ref 0.44–1.00)
GFR, Estimated: >60 mL/min (ref >60)
Glucose, Bld: 84 mg/dL (ref 70–99)
Potassium: 3.8 mmol/L (ref 3.5–5.1)
Sodium: 137 mmol/L (ref 135–145)
Total Bilirubin: 0.2 mg/dL — ABNORMAL LOW (ref 0.3–1.2)
Total Protein: 7.3 g/dL (ref 6.5–8.1)

## 2020-12-29 LAB — URINALYSIS, ROUTINE W REFLEX MICROSCOPIC
Bilirubin Urine: NEGATIVE
Glucose, UA: NEGATIVE mg/dL
Hgb urine dipstick: NEGATIVE
Ketones, ur: NEGATIVE mg/dL
Leukocytes,Ua: NEGATIVE
Nitrite: NEGATIVE
Protein, ur: NEGATIVE mg/dL
Specific Gravity, Urine: 1.025 (ref 1.005–1.030)
pH: 5.5 (ref 5.0–8.0)

## 2020-12-29 LAB — CBC WITH DIFFERENTIAL/PLATELET
Abs Immature Granulocytes: 0.01 10*3/uL (ref 0.00–0.07)
Basophils Absolute: 0 10*3/uL (ref 0.0–0.1)
Basophils Relative: 1 %
Eosinophils Absolute: 0.1 10*3/uL (ref 0.0–0.5)
Eosinophils Relative: 3 %
HCT: 37.3 % (ref 36.0–46.0)
Hemoglobin: 11.9 g/dL — ABNORMAL LOW (ref 12.0–15.0)
Immature Granulocytes: 0 %
Lymphocytes Relative: 37 %
Lymphs Abs: 1.8 10*3/uL (ref 0.7–4.0)
MCH: 27.1 pg (ref 26.0–34.0)
MCHC: 31.9 g/dL (ref 30.0–36.0)
MCV: 85 fL (ref 80.0–100.0)
Monocytes Absolute: 0.7 10*3/uL (ref 0.1–1.0)
Monocytes Relative: 15 %
Neutro Abs: 2.1 10*3/uL (ref 1.7–7.7)
Neutrophils Relative %: 44 %
Platelets: 130 10*3/uL — ABNORMAL LOW (ref 150–400)
RBC: 4.39 MIL/uL (ref 3.87–5.11)
RDW: 14 % (ref 11.5–15.5)
WBC: 4.8 10*3/uL (ref 4.0–10.5)
nRBC: 0 % (ref 0.0–0.2)

## 2020-12-29 LAB — LIPASE, BLOOD: Lipase: 17 U/L (ref 11–51)

## 2020-12-29 LAB — PREGNANCY, URINE: Preg Test, Ur: NEGATIVE

## 2020-12-29 MED ORDER — ACETAMINOPHEN 500 MG PO TABS
1000.0000 mg | ORAL_TABLET | Freq: Once | ORAL | Status: AC
  Administered 2020-12-29: 1000 mg via ORAL
  Filled 2020-12-29: qty 2

## 2020-12-29 NOTE — Discharge Instructions (Signed)
You have been evaluated for your abdominal pain.  Fortunately no concerning finding were noted during this exam.  Your CT scan of the abdomen pelvis did not show any concerning finding.  Please follow-up with your doctor for further care.  Return if you have any concern.  You may take over-the-counter Tylenol or ibuprofen as needed for pain.

## 2020-12-29 NOTE — ED Triage Notes (Signed)
Pt presents with right groin pain, she has a known inguinal hernia. Pt also states has been treated for uti twice since august.

## 2020-12-29 NOTE — ED Provider Notes (Signed)
[facility] EMERGENCY DEPT Provider Note   CSN: [id number] Arrival date & time: 12/29/20  1237     History Chief Complaint  Patient presents with   Abdominal Pain    Michelle Velez is a 30 y.o. female.  The history is provided by the patient. No language interpreter was used.  Abdominal Pain  30 year old female with history of inguinal hernia who presents for evaluation of groin pain.  Patient report for the past 3 days she has noticed increasing pain to her right lower abdomen/right groin.  Pain is described as a discomfort sensation with associated hardness and firmness to the affected area when she pushed on it.  Pain is waxing waning, rates as 5 out of 10.  No associated fever or chills no nausea vomiting diarrhea dysuria hematuria.  She has had trouble with her hernia in the past but has not noticed any increase in swelling.  She reported having been diagnosed with UTI twice within the past month but denies any dysuria at this time.  No new sexual partner no vaginal bleeding or vaginal discharge.  Past Medical History:  Diagnosis Date   Anemia    Gestational thrombocytopenia (HCC)    Gestational thrombocytopenia without hemorrhage in third trimester (HCC) 08/22/2019   Likely gestational though review of Care Everywhere records shows has intermittently had during non-pregnant times as well Most recent checks 129>115>91>133 on 08/22/2019 Nadir of 74 during admission for last delivery Recheck at 36-37 wks, consider heme consult if again dips below 100k   History of cesarean delivery 04/13/2019   VBAC consent signed 08/08/2019 Op note in Care Everywhere 03/15/2015   Inguinal hernia 2019   Mild preeclampsia 11/06/2019   Postoperative ileus (HCC) 11/08/2019   Scoliosis 2006    There are no problems to display for this patient.   Past Surgical History:  Procedure Laterality Date   CESAREAN SECTION     CESAREAN SECTION  11/06/2019   Procedure: CESAREAN SECTION;  Surgeon:  Warden Fillers, MD;  Location: [facility] LD [facility]S;  Service: Obstetrics;;     OB History     Gravida  3   Para  2   Term  2   Preterm      AB  1   Living  2      SAB  1   IAB      Ectopic      Multiple  0   Live Births  2           Family History  Problem Relation Age of Onset   Asthma Mother    Asthma Brother    Diabetes Maternal Grandmother     Social History   Tobacco Use   Smoking status: Never   Smokeless tobacco: Never  Vaping Use   Vaping Use: Never used  Substance Use Topics   Alcohol use: Never   Drug use: Never    Home Medications Prior to Admission medications   Medication Sig Start Date End Date Taking? Authorizing Provider  acetaminophen (TYLENOL) 500 MG tablet Take 2 tablets (1,000 mg total) by mouth every 8 (eight) hours. 11/10/19   Sheila Oats, MD  amLODipine (NORVASC) 5 MG tablet Take 1 tablet (5 mg total) by mouth daily. 11/11/19   Sheila Oats, MD  amoxicillin-clavulanate (AUGMENTIN) 875-125 MG tablet Take 1 tablet by mouth every 12 (twelve) hours. 11/27/20   Lance Muss, FNP  fluconazole (DIFLUCAN) 150 MG tablet Take 1 tablet (150 mg total) by  mouth daily. 07/07/20   Elson Areas, PA-C  norethindrone (MICRONOR) 0.35 MG tablet Take 1 tablet (0.35 mg total) by mouth daily. 02/08/20   Malachy Chamber, MD  Prenatal Vit-Fe Fumarate-FA (MULTIVITAMIN-PRENATAL) 27-0.8 MG TABS tablet Take 1 tablet by mouth daily at 12 noon.    [provider]    Allergies    Patient has no known allergies.  Review of Systems   Review of Systems  Gastrointestinal:  Positive for abdominal pain.  All other systems reviewed and are negative.  Physical Exam Updated Vital Signs BP 110/69   Pulse 68   Temp 97.9 F (36.6 C) (Oral)   Resp 20   SpO2 100%   Physical Exam Vitals and nursing note reviewed.  Constitutional:      General: She is not in acute distress.    Appearance: She is well-developed.  HENT:     Head: Atraumatic.   Eyes:     Conjunctiva/sclera: Conjunctivae normal.  Cardiovascular:     Rate and Rhythm: Normal rate and regular rhythm.  Pulmonary:     Effort: Pulmonary effort is normal.  Abdominal:     Palpations: Abdomen is soft.     Tenderness: There is abdominal tenderness (Tenderness to right lower quadrant and along the right inguinal region without hernia noted.). There is no guarding or rebound. Negative signs include Murphy's sign and McBurney's sign.  Musculoskeletal:     Cervical back: Neck supple.  Skin:    Findings: No rash.  Neurological:     Mental Status: She is alert.  Psychiatric:        Mood and Affect: Mood normal.    ED Results / Procedures / Treatments   Labs (all labs ordered are listed, but only abnormal results are displayed) Labs Reviewed  CBC WITH DIFFERENTIAL/PLATELET - Abnormal; Notable for the following components:      Result Value   Hemoglobin 11.9 (*)    Platelets 130 (*)    All other components within normal limits  COMPREHENSIVE METABOLIC PANEL - Abnormal; Notable for the following components:   Total Bilirubin 0.2 (*)    All other components within normal limits  URINALYSIS, ROUTINE W REFLEX MICROSCOPIC - Abnormal; Notable for the following components:   Color, Urine STRAW (*)    All other components within normal limits  LIPASE, BLOOD  PREGNANCY, URINE    EKG None  Radiology CT ABDOMEN PELVIS WO CONTRAST  Result Date: 12/29/2020 CLINICAL DATA:  30 year old female with acute abdominal, pelvic and RIGHT groin pain. EXAM: CT ABDOMEN AND PELVIS WITHOUT CONTRAST TECHNIQUE: Multidetector CT imaging of the abdomen and pelvis was performed following the standard protocol without IV contrast. COMPARISON:  11/08/2019 CT and prior studies FINDINGS: Please note that parenchymal abnormalities may be missed without intravenous contrast. Lower chest: Unremarkable Hepatobiliary: The liver and gallbladder are unremarkable. No biliary dilatation. Pancreas:  Unremarkable Spleen: Unremarkable Adrenals/Urinary Tract: The kidneys, adrenal glands and bladder are unremarkable. Stomach/Bowel: Stomach is within normal limits. Appendix appears normal. No evidence of bowel wall thickening, distention, or inflammatory changes. Vascular/Lymphatic: No significant vascular findings are present. No enlarged abdominal or pelvic lymph nodes. Reproductive: Uterus and bilateral adnexa are unremarkable. Other: No ascites, focal collection or pneumoperitoneum. No definite abdominal wall hernia identified. Musculoskeletal: No acute or suspicious bony abnormalities are noted. IMPRESSION: No evidence of acute abnormality. No CT findings to suggest a cause for this patient's RIGHT sided pain. Electronically Signed   By: Harmon Pier M.D.   On: 12/29/2020  19:21   CLINICAL DATA: 30 year old female with acute abdominal, pelvic and RIGHT groin pain.  EXAM: CT ABDOMEN AND PELVIS WITHOUT CONTRAST  TECHNIQUE: Multidetector CT imaging of the abdomen and pelvis was performed following the standard protocol without IV contrast.  COMPARISON: 11/08/2019 CT and prior studies  FINDINGS: Please note that parenchymal abnormalities may be missed without intravenous contrast.  Lower chest: Unremarkable  Hepatobiliary: The liver and gallbladder are unremarkable. No biliary dilatation.  Pancreas: Unremarkable  Spleen: Unremarkable  Adrenals/Urinary Tract: The kidneys, adrenal glands and bladder are unremarkable.  Stomach/Bowel: Stomach is within normal limits. Appendix appears normal. No evidence of bowel wall thickening, distention, or inflammatory changes.  Vascular/Lymphatic: No significant vascular findings are present. No enlarged abdominal or pelvic lymph nodes.  Reproductive: Uterus and bilateral adnexa are unremarkable.  Other: No ascites, focal collection or pneumoperitoneum. No definite abdominal wall hernia identified.  Musculoskeletal: No acute or suspicious  bony abnormalities are noted.  IMPRESSION: No evidence of acute abnormality. No CT findings to suggest a cause for this patient's RIGHT sided pain.   Electronically Signed By: Harmon Pier M.D. On: 12/29/2020 19:21 Procedures Procedures   Medications Ordered in ED Medications  acetaminophen (TYLENOL) tablet 1,000 mg (1,000 mg Oral Given 12/29/20 1921)    ED Course  I have reviewed the triage vital signs and the nursing notes.  Pertinent labs & imaging results that were available during my care of the patient were reviewed by me and considered in my medical decision making (see chart for details).    MDM Rules/Calculators/A&P                           BP 117/75 (BP Location: Right Arm)   Pulse 64   Temp 97.9 F (36.6 C) (Oral)   Resp 16   SpO2 100%   Final Clinical Impression(s) / ED Diagnoses Final diagnoses:  Lower abdominal pain    Rx / DC Orders ED Discharge Orders     None      Patient endorsed pain to her right lower abdomen/right groin region for the past few days.  She has history of inguinal hernia there.  No obvious hernia on my exam.  She does have a C-section scar along her lower pannus.  Area is mildly firm to palpation.  Work initiated, CT scan ordered.  8:19 PM Labs and CT scan without any concerning feature.  I discussed finding with patient.  She is stable for discharge.  Outpatient follow-up recommended.   Fayrene Helper, PA-C 12/29/20 2150    Cheryll Cockayne, MD 01/15/21 [phone]

## 2021-06-30 ENCOUNTER — Other Ambulatory Visit: Payer: Self-pay

## 2021-06-30 ENCOUNTER — Ambulatory Visit (INDEPENDENT_AMBULATORY_CARE_PROVIDER_SITE_OTHER): Payer: 59 | Admitting: Family Medicine

## 2021-06-30 ENCOUNTER — Encounter: Payer: Self-pay | Admitting: Family Medicine

## 2021-06-30 VITALS — BP 109/69 | HR 75 | Temp 98.0°F | Resp 16 | Ht 63.0 in | Wt 110.2 lb

## 2021-06-30 DIAGNOSIS — Z7689 Persons encountering health services in other specified circumstances: Secondary | ICD-10-CM | POA: Diagnosis not present

## 2021-06-30 DIAGNOSIS — Z0001 Encounter for general adult medical examination with abnormal findings: Secondary | ICD-10-CM | POA: Diagnosis not present

## 2021-06-30 DIAGNOSIS — Z13220 Encounter for screening for lipoid disorders: Secondary | ICD-10-CM

## 2021-06-30 DIAGNOSIS — Z1159 Encounter for screening for other viral diseases: Secondary | ICD-10-CM

## 2021-06-30 DIAGNOSIS — Z0000 Encounter for general adult medical examination without abnormal findings: Secondary | ICD-10-CM

## 2021-06-30 DIAGNOSIS — N6313 Unspecified lump in the right breast, lower outer quadrant: Secondary | ICD-10-CM | POA: Diagnosis not present

## 2021-06-30 DIAGNOSIS — Z130 Encounter for screening for diseases of the blood and blood-forming organs and certain disorders involving the immune mechanism: Secondary | ICD-10-CM

## 2021-06-30 DIAGNOSIS — Z13228 Encounter for screening for other metabolic disorders: Secondary | ICD-10-CM

## 2021-06-30 NOTE — Progress Notes (Signed)
Patient is concern about her anemia and would like to get some blood work done ? ?Patient is new to practice ?

## 2021-06-30 NOTE — Progress Notes (Signed)
? ?New Patient Office Visit ? ?Subjective:  ?Patient ID: Michelle Velez, female    DOB: 09-23-1990  Age: 31 y.o. MRN: [id number] ? ?CC:  ?Chief Complaint  ?Patient presents with  ? Establish Care  ? ? ?HPI ?Michelle Velez presents for to establish care. Patient also is for routine annual exam. She denies any acute complaints or concerns.  ? ?Past Medical History:  ?Diagnosis Date  ? Anemia   ? Gestational thrombocytopenia ([place])   ? Gestational thrombocytopenia without hemorrhage in third trimester ([place]) 08/22/2019  ? Likely gestational though review of Care Everywhere records shows has intermittently had during non-pregnant times as well Most recent checks 129>115>91>133 on 08/22/2019 Nadir of 74 during admission for last delivery Recheck at 36-37 wks, consider heme consult if again dips below 100k  ? History of cesarean delivery 04/13/2019  ? VBAC consent signed 08/08/2019 Op note in Care Everywhere 03/15/2015  ? Inguinal hernia 2019  ? Mild preeclampsia 11/06/2019  ? Postoperative ileus ([place]) 11/08/2019  ? Scoliosis 2006  ? ? ?Past Surgical History:  ?Procedure Laterality Date  ? CESAREAN SECTION    ? CESAREAN SECTION  11/06/2019  ? Procedure: CESAREAN SECTION;  Surgeon: Griffin Basil, MD;  Location: [facility] LD ORS;  Service: Obstetrics;;  ? ? ?Family History  ?Problem Relation Age of Onset  ? Asthma Mother   ? Asthma Brother   ? Diabetes Maternal Grandmother   ? ? ?Social History  ? ?Socioeconomic History  ? Marital status: Single  ?  Spouse name: Not on file  ? Number of children: Not on file  ? Years of education: Not on file  ? Highest education level: Not on file  ?Occupational History  ? Not on file  ?Tobacco Use  ? Smoking status: Never  ? Smokeless tobacco: Never  ?Vaping Use  ? Vaping Use: Never used  ?Substance and Sexual Activity  ? Alcohol use: Never  ? Drug use: Never  ? Sexual activity: Yes  ?  Birth control/protection: None  ?Other Topics Concern  ? Not on file  ?Social History Narrative  ? Not on file  ? ?Social  Determinants of Health  ? ?Financial Resource Strain: Not on file  ?Food Insecurity: Not on file  ?Transportation Needs: Not on file  ?Physical Activity: Not on file  ?Stress: Not on file  ?Social Connections: Not on file  ?Intimate Partner Violence: Not on file  ? ? ?ROS ?Review of Systems  ?All other systems reviewed and are negative. ? ?Objective:  ? ?Today's Vitals: BP 109/69   Pulse 75   Temp 98 ?F (36.7 ?C) (Oral)   Resp 16   Ht _0  (1.6 m)   Wt 110 lb 3.2 oz (50 kg)   SpO2 99%   BMI 19.52 kg/m?  ? ?Physical Exam ?Vitals and nursing note reviewed.  ?Constitutional:   ?   General: She is not in acute distress. ?Cardiovascular:  ?   Rate and Rhythm: Normal rate and regular rhythm.  ?Pulmonary:  ?   Effort: Pulmonary effort is normal.  ?   Breath sounds: Normal breath sounds.  ?Chest:  ?Breasts: ?   Right: Normal.  ?   Left: Mass (near nipple at about 4 o'clock. solitary  NTTP, about small grape size) present. No tenderness.  ?Abdominal:  ?   Palpations: Abdomen is soft.  ?   Tenderness: There is no abdominal tenderness.  ?Genitourinary: ?   Urethra: Urethral pain: deferred. patient on menses.  ?Neurological:  ?  General: No focal deficit present.  ?   Mental Status: She is alert and oriented to person, place, and time.  ? ? ?Assessment & Plan:  ? ?1. Annual physical exam ?Routine labs ordered ?- CMP14+EGFR ? ?2. Mass of lower outer quadrant of right breast ?Referral for imaging ?- MM Digital Diagnostic Bilat; Future ? ?3. Need for hepatitis C screening test ? ?- Hepatitis C Antibody ? ?4. Screening for endocrine/metabolic/immunity disorders ? ? ?5. Screening for deficiency anemia ? ?- CBC with Differential ?- TSH ? ?6. Screening for lipid disorders ? ?- Lipid Panel ? ?7. Encounter to establish care ? ? ? ?Outpatient Encounter Medications as of 06/30/2021  ?Medication Sig  ? [DISCONTINUED] acetaminophen (TYLENOL) 500 MG tablet Take 2 tablets (1,000 mg total) by mouth every 8 (eight) hours.  ?  [DISCONTINUED] amLODipine (NORVASC) 5 MG tablet Take 1 tablet (5 mg total) by mouth daily.  ? [DISCONTINUED] amoxicillin-clavulanate (AUGMENTIN) 875-125 MG tablet Take 1 tablet by mouth every 12 (twelve) hours.  ? [DISCONTINUED] fluconazole (DIFLUCAN) 150 MG tablet Take 1 tablet (150 mg total) by mouth daily.  ? [DISCONTINUED] norethindrone (MICRONOR) 0.35 MG tablet Take 1 tablet (0.35 mg total) by mouth daily.  ? [DISCONTINUED] Prenatal Vit-Fe Fumarate-FA (MULTIVITAMIN-PRENATAL) 27-0.8 MG TABS tablet Take 1 tablet by mouth daily at 12 noon.  ? ?No facility-administered encounter medications on file as of 06/30/2021.  ? ? ?Follow-up: No follow-ups on file.  ? ?Becky Sax, MD ? ?

## 2021-07-01 LAB — LIPID PANEL
Chol/HDL Ratio: 1.9 ratio (ref 0.0–4.4)
Cholesterol, Total: 149 mg/dL (ref 100–199)
HDL: 80 mg/dL (ref >39)
LDL Chol Calc (NIH): 61 mg/dL (ref 0–99)
Triglycerides: 30 mg/dL (ref 0–149)
VLDL Cholesterol Cal: 8 mg/dL (ref 5–40)

## 2021-07-01 LAB — CBC WITH DIFFERENTIAL/PLATELET
Basophils Absolute: 0 10*3/uL (ref 0.0–0.2)
Basos: 1 %
EOS (ABSOLUTE): 0.2 10*3/uL (ref 0.0–0.4)
Eos: 4 %
Hematocrit: 35.5 % (ref 34.0–46.6)
Hemoglobin: 10.8 g/dL — ABNORMAL LOW (ref 11.1–15.9)
Immature Grans (Abs): 0 10*3/uL (ref 0.0–0.1)
Immature Granulocytes: 0 %
Lymphocytes Absolute: 1.2 10*3/uL (ref 0.7–3.1)
Lymphs: 29 %
MCH: 23.6 pg — ABNORMAL LOW (ref 26.6–33.0)
MCHC: 30.4 g/dL — ABNORMAL LOW (ref 31.5–35.7)
MCV: 78 fL — ABNORMAL LOW (ref 79–97)
Monocytes Absolute: 0.4 10*3/uL (ref 0.1–0.9)
Monocytes: 11 %
Neutrophils Absolute: 2.3 10*3/uL (ref 1.4–7.0)
Neutrophils: 55 %
Platelets: 181 10*3/uL (ref 150–450)
RBC: 4.57 x10E6/uL (ref 3.77–5.28)
RDW: 16.5 % — ABNORMAL HIGH (ref 11.7–15.4)
WBC: 4.1 10*3/uL (ref 3.4–10.8)

## 2021-07-01 LAB — HEPATITIS C ANTIBODY: Hep C Virus Ab: NONREACTIVE

## 2021-07-01 LAB — CMP14+EGFR
ALT: 11 IU/L (ref 0–32)
AST: 18 IU/L (ref 0–40)
Albumin/Globulin Ratio: 1.6 (ref 1.2–2.2)
Albumin: 4.7 g/dL (ref 3.8–4.8)
Alkaline Phosphatase: 105 IU/L (ref 44–121)
BUN/Creatinine Ratio: 20 (ref 9–23)
BUN: 12 mg/dL (ref 6–20)
Bilirubin Total: <0.2 mg/dL (ref 0.0–1.2)
CO2: 23 mmol/L (ref 20–29)
Calcium: 9.1 mg/dL (ref 8.7–10.2)
Chloride: 106 mmol/L (ref 96–106)
Creatinine, Ser: 0.6 mg/dL (ref 0.57–1.00)
Globulin, Total: 2.9 g/dL (ref 1.5–4.5)
Glucose: 78 mg/dL (ref 70–99)
Potassium: 4.4 mmol/L (ref 3.5–5.2)
Sodium: 140 mmol/L (ref 134–144)
Total Protein: 7.6 g/dL (ref 6.0–8.5)
eGFR: 122 mL/min/{1.73_m2} (ref >59)

## 2021-07-01 LAB — TSH: TSH: 0.914 u[IU]/mL (ref 0.450–4.500)

## 2021-07-02 ENCOUNTER — Other Ambulatory Visit: Payer: Self-pay | Admitting: Family Medicine

## 2021-07-02 ENCOUNTER — Encounter: Payer: Self-pay | Admitting: Family Medicine

## 2021-07-02 MED ORDER — IRON (FERROUS SULFATE) 325 (65 FE) MG PO TABS: 325.0000 mg | ORAL_TABLET | Freq: Two times a day (BID) | ORAL | 3 refills | Status: AC

## 2021-07-04 ENCOUNTER — Other Ambulatory Visit: Payer: Self-pay | Admitting: Family Medicine

## 2021-07-04 DIAGNOSIS — R928 Other abnormal and inconclusive findings on diagnostic imaging of breast: Secondary | ICD-10-CM

## 2021-07-04 DIAGNOSIS — N6323 Unspecified lump in the left breast, lower outer quadrant: Secondary | ICD-10-CM

## 2021-07-29 ENCOUNTER — Ambulatory Visit: Payer: 59

## 2021-07-29 ENCOUNTER — Ambulatory Visit
Admission: RE | Admit: 2021-07-29 | Discharge: 2021-07-29 | Disposition: A | Discharge status: Home / Self Care | Payer: Commercial Managed Care - PPO | Source: Ambulatory Visit | Attending: Family Medicine | Admitting: Family Medicine

## 2021-07-29 DIAGNOSIS — N6323 Unspecified lump in the left breast, lower outer quadrant: Secondary | ICD-10-CM

## 2021-08-15 ENCOUNTER — Ambulatory Visit
Admission: RE | Admit: 2021-08-15 | Discharge: 2021-08-15 | Disposition: A | Discharge status: Home / Self Care | Payer: Commercial Managed Care - PPO | Source: Ambulatory Visit | Attending: Family Medicine | Admitting: Family Medicine

## 2021-08-15 DIAGNOSIS — R928 Other abnormal and inconclusive findings on diagnostic imaging of breast: Secondary | ICD-10-CM

## 2021-09-27 ENCOUNTER — Ambulatory Visit
Admission: EM | Admit: 2021-09-27 | Discharge: 2021-09-27 | Disposition: A | Discharge status: Home / Self Care | Payer: Commercial Managed Care - PPO | Attending: Internal Medicine | Admitting: Internal Medicine

## 2021-09-27 DIAGNOSIS — N898 Other specified noninflammatory disorders of vagina: Secondary | ICD-10-CM | POA: Insufficient documentation

## 2021-09-27 DIAGNOSIS — R300 Dysuria: Secondary | ICD-10-CM | POA: Diagnosis present

## 2021-09-27 LAB — POCT URINALYSIS DIP (MANUAL ENTRY)
Bilirubin, UA: NEGATIVE
Glucose, UA: NEGATIVE mg/dL
Ketones, POC UA: NEGATIVE mg/dL
Leukocytes, UA: NEGATIVE
Nitrite, UA: NEGATIVE
Protein Ur, POC: 30 mg/dL — AB
Spec Grav, UA: >=1.03 — AB (ref 1.010–1.025)
Urobilinogen, UA: 0.2 E.U./dL
pH, UA: 6 (ref 5.0–8.0)

## 2021-09-27 NOTE — ED Provider Notes (Signed)
[name]    CSN: [id number] Arrival date & time: 09/27/21  5573      History   Chief Complaint Chief Complaint  Patient presents with   Abdominal Pain    HPI Michelle Velez is a 31 y.o. female.   Patient presents with lower abdominal pain and vaginal itching started yesterday.  Patient is attributing lower abdominal pain to recently starting her menstrual cycle as it is lower abdominal cramping which is consistent with this.  Patient is currently on her menstrual cycle that started today.  Denies vaginal discharge, hematuria, back pain, pelvic pain, fever.  She does endorse one episode of dysuria about a week ago that is now resolved.  Denies any concern for STD as patient reports that she has not had any sexual intercourse in multiple months.  Patient is concerned for bacterial vaginosis as she has had this before and it feels similar.  Patient is currently breast-feeding.   Abdominal Pain   Past Medical History:  Diagnosis Date   Anemia    Gestational thrombocytopenia (HCC)    Gestational thrombocytopenia without hemorrhage in third trimester (HCC) 08/22/2019   Likely gestational though review of Care Everywhere records shows has intermittently had during non-pregnant times as well Most recent checks 129>115>91>133 on 08/22/2019 Nadir of 74 during admission for last delivery Recheck at 36-37 wks, consider heme consult if again dips below 100k   History of cesarean delivery 04/13/2019   VBAC consent signed 08/08/2019 Op note in Care Everywhere 03/15/2015   Inguinal hernia 2019   Mild preeclampsia 11/06/2019   Postoperative ileus (HCC) 11/08/2019   Scoliosis 2006    There are no problems to display for this patient.   Past Surgical History:  Procedure Laterality Date   BREAST BIOPSY Left 08/29/2018   CESAREAN SECTION     CESAREAN SECTION  11/06/2019   Procedure: CESAREAN SECTION;  Surgeon: Warden Fillers, MD;  Location: [facility] LD ORS;  Service: Obstetrics;;     OB History     Gravida  3   Para  2   Term  2   Preterm      AB  1   Living  2      SAB  1   IAB      Ectopic      Multiple  0   Live Births  2            Home Medications    Prior to Admission medications   Medication Sig Start Date End Date Taking? Authorizing Provider  Iron, Ferrous Sulfate, 325 (65 Fe) MG TABS Take 325 mg by mouth 2 (two) times daily. 07/02/21   Georganna Skeans, MD    Family History Family History  Problem Relation Age of Onset   Asthma Mother    Asthma Brother    Diabetes Maternal Grandmother     Social History Social History   Tobacco Use   Smoking status: Never   Smokeless tobacco: Never  Vaping Use   Vaping Use: Never used  Substance Use Topics   Alcohol use: Never   Drug use: Never     Allergies   Patient has no known allergies.   Review of Systems Review of Systems Per HPI  Physical Exam Triage Vital Signs ED Triage Vitals [09/27/21 0845]  Enc Vitals Group     BP 125/76     Pulse Rate 63     Resp 18     Temp 98 F (36.7 C)  Temp Source Oral     SpO2 98 %     Weight      Height      Head Circumference      Peak Flow      Pain Score 0     Pain Loc      Pain Edu?      Excl. in GC?    No data found.  Updated Vital Signs BP 125/76 (BP Location: Right Arm)   Pulse 63   Temp 98 F (36.7 C) (Oral)   Resp 18   SpO2 98%   Breastfeeding Yes   Visual Acuity Right Eye Distance:   Left Eye Distance:   Bilateral Distance:    Right Eye Near:   Left Eye Near:    Bilateral Near:     Physical Exam Constitutional:      General: She is not in acute distress.    Appearance: Normal appearance. She is not toxic-appearing or diaphoretic.  HENT:     Head: Normocephalic and atraumatic.  Eyes:     Extraocular Movements: Extraocular movements intact.     Conjunctiva/sclera: Conjunctivae normal.  Cardiovascular:     Rate and Rhythm: Normal rate and regular rhythm.     Pulses: Normal pulses.      Heart sounds: Normal heart sounds.  Pulmonary:     Effort: Pulmonary effort is normal. No respiratory distress.     Breath sounds: Normal breath sounds.  Abdominal:     General: Abdomen is flat. Bowel sounds are normal. There is no distension.     Palpations: Abdomen is soft.     Tenderness: There is no abdominal tenderness.  Genitourinary:    Comments: Deferred with shared decision making.  Self swab performed. Neurological:     General: No focal deficit present.     Mental Status: She is alert and oriented to person, place, and time. Mental status is at baseline.  Psychiatric:        Mood and Affect: Mood normal.        Behavior: Behavior normal.        Thought Content: Thought content normal.        Judgment: Judgment normal.      UC Treatments / Results  Labs (all labs ordered are listed, but only abnormal results are displayed) Labs Reviewed  POCT URINALYSIS DIP (MANUAL ENTRY) - Abnormal; Notable for the following components:      Result Value   Clarity, UA cloudy (*)    Spec Grav, UA >=1.030 (*)    Blood, UA large (*)    Protein Ur, POC =30 (*)    All other components within normal limits  URINE CULTURE  CERVICOVAGINAL ANCILLARY ONLY    EKG   Radiology No results found.  Procedures Procedures (including critical care time)  Medications Ordered in UC Medications - No data to display  Initial Impression / Assessment and Plan / UC Course  I have reviewed the triage vital signs and the nursing notes.  Pertinent labs & imaging results that were available during my care of the patient were reviewed by me and considered in my medical decision making (see chart for details).     Differential diagnoses include bacterial vaginosis versus vaginal yeast.  No concern for STD as patient reports that she has not had any recent unprotected sexual activity.  UA does not shoe evidence of an urinary tract infection but will send urine culture given lower abdominal pain and  an episode  of dysuria.  Suspect lower abdominal cramping is attributed to menstrual cycle given that it started prior to menstrual cycle starting.  Will await results for cervicovaginal swab for treatment given no vaginal discharge is present.  Patient to refrain from sexual activity until test results and treatment are complete.  Discussed return precautions.  Patient verbalized understanding and was agreeable with plan. Final Clinical Impressions(s) / UC Diagnoses   Final diagnoses:  Vaginal itching  Dysuria     Discharge Instructions      Your urine did not show any signs of urinary tract infection.  It does show that you should increase your water intake.  We will send off vaginal swab and call if results are positive.  We will send appropriate treatment if necessary.  Follow-up if symptoms persist or worsen.     ED Prescriptions   None    PDMP not reviewed this encounter.   Gustavus Bryant, [place] 09/27/21 [phone]

## 2021-09-27 NOTE — Discharge Instructions (Signed)
Your urine did not show any signs of urinary tract infection.  It does show that you should increase your water intake.  We will send off vaginal swab and call if results are positive.  We will send appropriate treatment if necessary.  Follow-up if symptoms persist or worsen.

## 2021-09-27 NOTE — ED Triage Notes (Signed)
Pt c/o abd pain and vaginal itching

## 2021-09-28 LAB — URINE CULTURE: Culture: NO GROWTH

## 2021-09-29 ENCOUNTER — Telehealth (HOSPITAL_COMMUNITY): Payer: Self-pay | Admitting: Emergency Medicine

## 2021-09-29 LAB — CERVICOVAGINAL ANCILLARY ONLY
Bacterial Vaginitis (gardnerella): NEGATIVE
Candida Glabrata: NEGATIVE
Candida Vaginitis: POSITIVE — AB
Comment: NEGATIVE
Comment: NEGATIVE
Comment: NEGATIVE

## 2021-09-29 MED ORDER — FLUCONAZOLE 150 MG PO TABS: 150.0000 mg | ORAL_TABLET | Freq: Once | ORAL | 0 refills | Status: AC

## 2021-12-02 IMAGING — US US OB COMP LESS 14 WK
2 series · 15 of 28 positions shown · non-contrast
Comparison: None.

CLINICAL DATA: Bleeding

EXAM:
OBSTETRIC <14 WK ULTRASOUND
TECHNIQUE: Transabdominal ultrasound was performed for evaluation of the
gestation as well as the maternal uterus and adnexal regions.

[Series 1: us ob comp less 14 wk · 14 of 26 slices shown (1 of 2)]
[im 1/26]
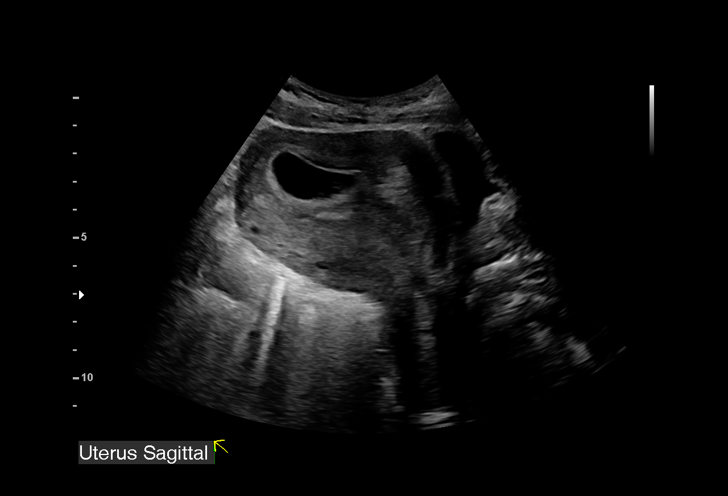
[im 3/26]
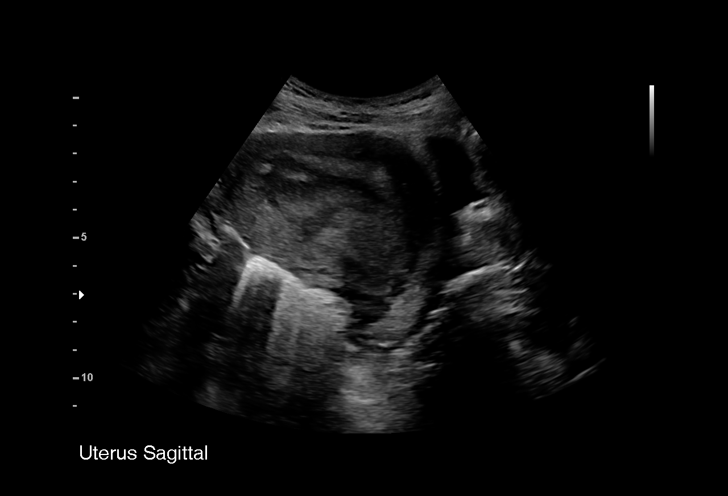
[im 5/26]
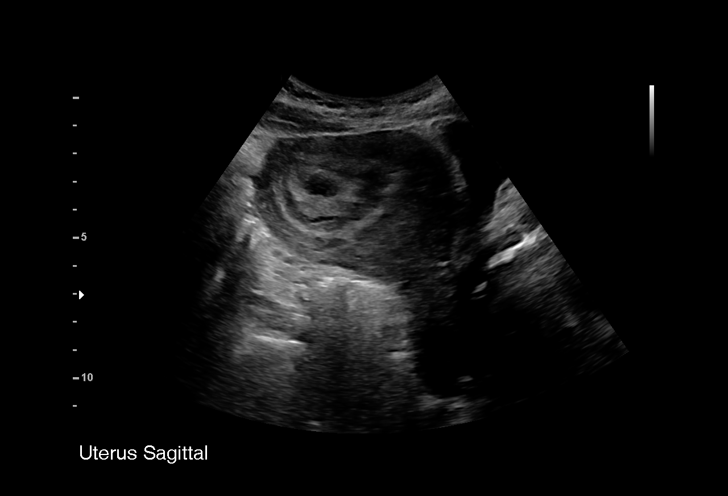
[im 7/26]
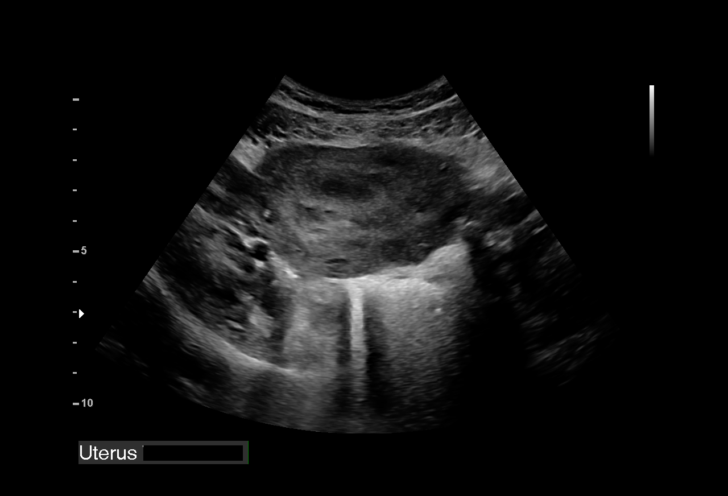
[im 9/26]
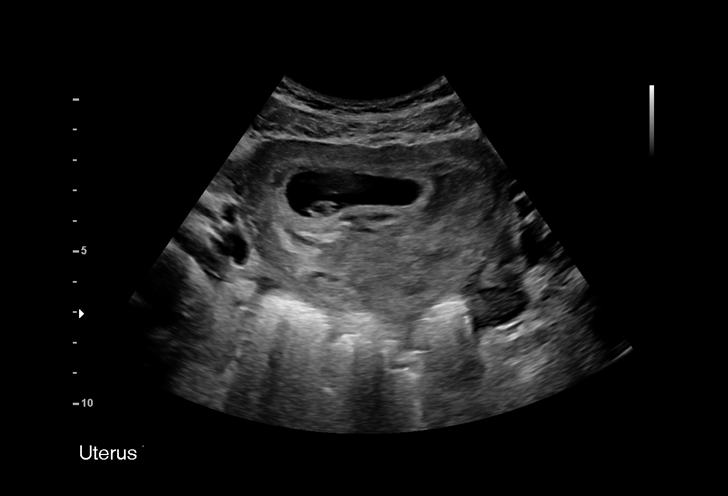
[im 11/26]
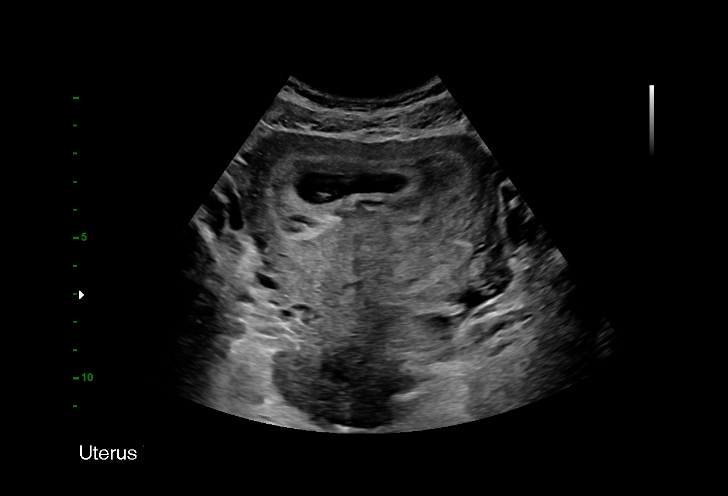
[im 13/26]
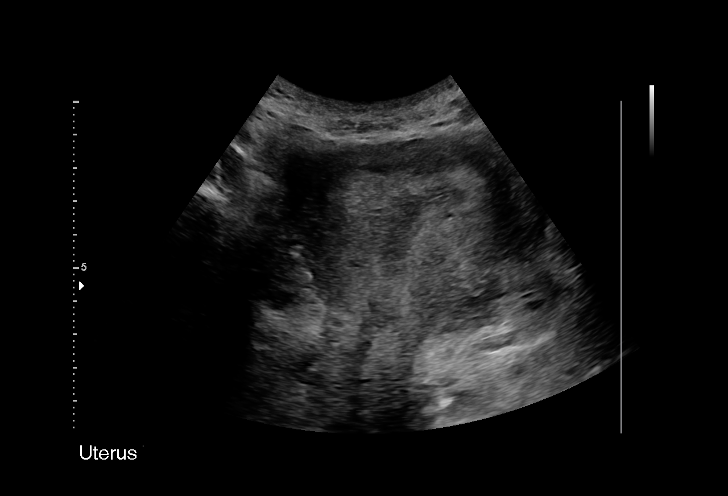
[im 15/26]
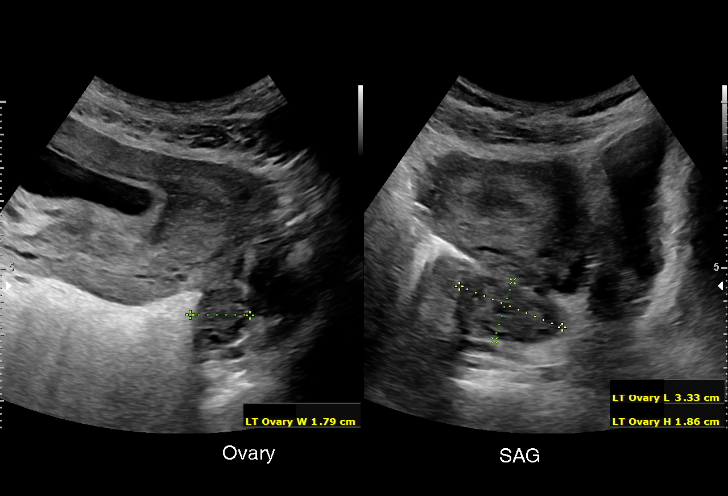
[im 16/26]
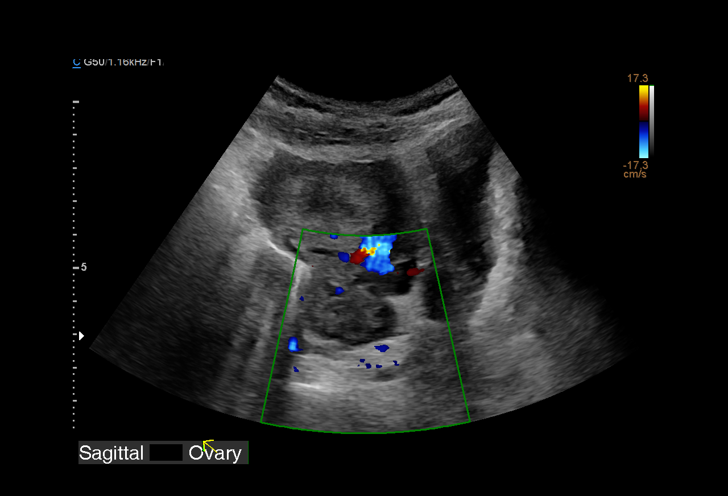
[im 18/26]
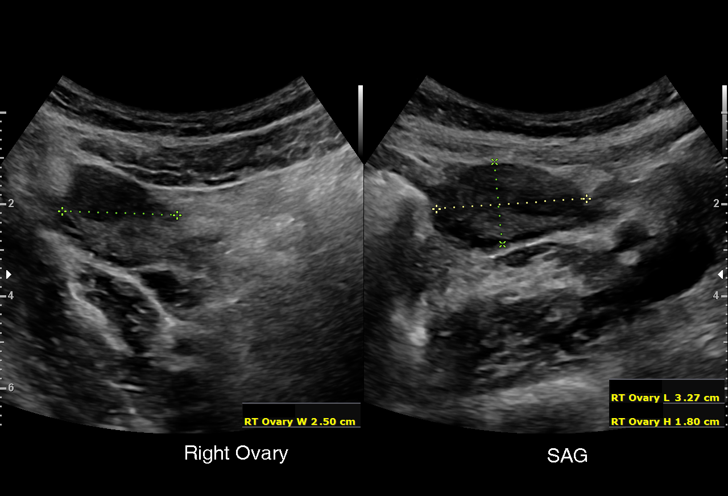
[im 20/26]
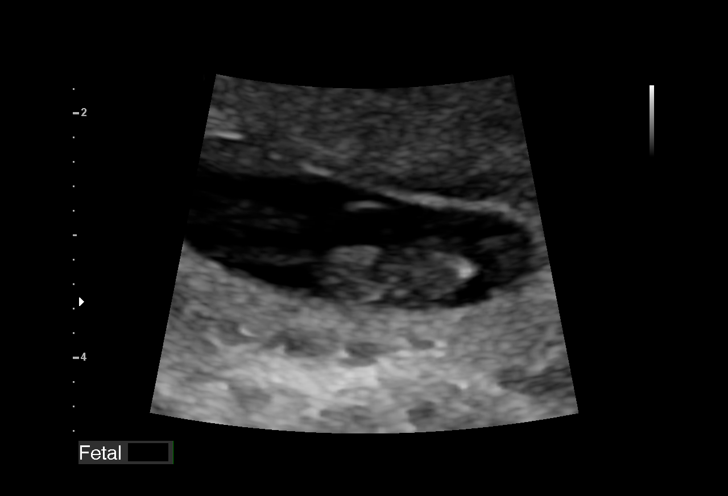
[im 22/26]
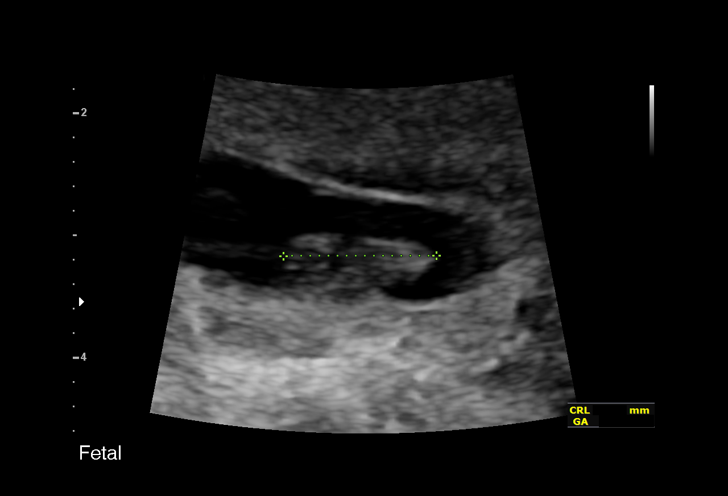
[im 24/26]
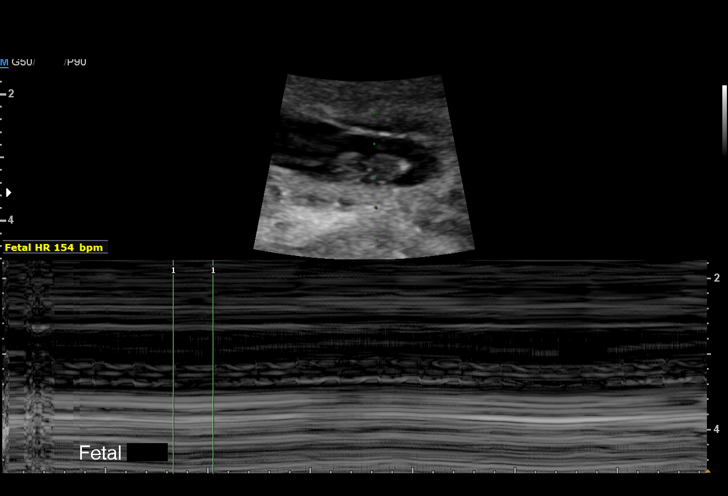
[im 26/26]
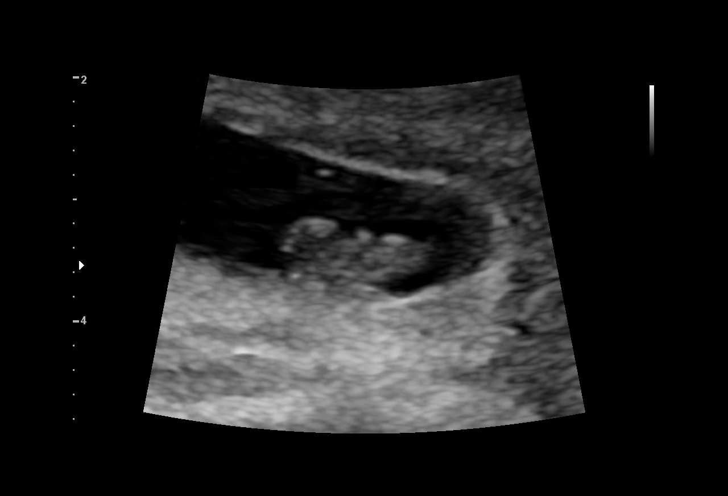

[Series 2: us ob comp less 14 wk · 1 of 2 slices shown (2 of 2)]
[im 2/2]
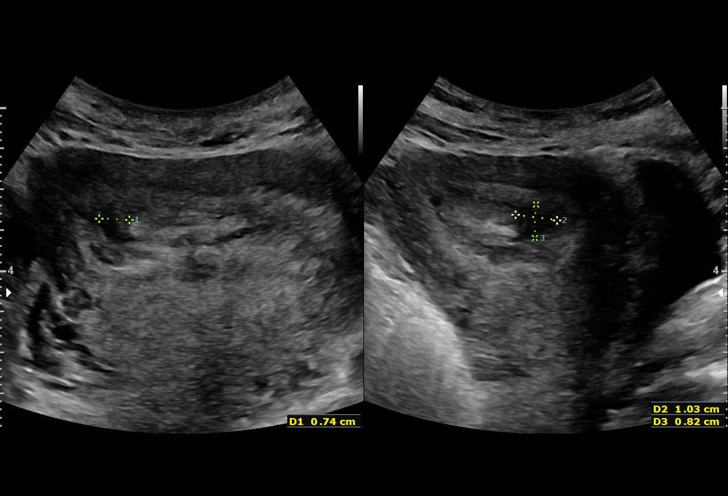

[15 of 28 positions shown; findings below may reference images not displayed]

FINDINGS: Intrauterine gestational sac: Single

Yolk sac:  Visualized.

Embryo:  Visualized.

Cardiac Activity: Visualized.

Heart Rate: 154 bpm

CRL: 12.8 mm   7 w 3 d                  US EDC: 11/05/2019

Subchorionic hemorrhage: There is a small volume of subchorionic
hemorrhage.

Maternal uterus/adnexae: There is no significant maternal
abnormality. There is a probable right-sided corpus luteal cyst.
IMPRESSION: Single live IUP at 7 weeks and 3 days as detailed above. There is a
small volume of subchorionic hemorrhage.

## 2021-12-30 ENCOUNTER — Ambulatory Visit (INDEPENDENT_AMBULATORY_CARE_PROVIDER_SITE_OTHER): Payer: Commercial Managed Care - PPO | Admitting: Family Medicine

## 2021-12-30 ENCOUNTER — Other Ambulatory Visit (HOSPITAL_COMMUNITY)
Admission: RE | Admit: 2021-12-30 | Discharge: 2021-12-30 | Disposition: A | Discharge status: Home / Self Care | Payer: Commercial Managed Care - PPO | Source: Ambulatory Visit | Attending: Family Medicine | Admitting: Family Medicine

## 2021-12-30 ENCOUNTER — Encounter: Payer: Self-pay | Admitting: Family Medicine

## 2021-12-30 VITALS — BP 113/72 | HR 72 | Temp 98.1°F | Resp 16 | Wt 115.0 lb

## 2021-12-30 DIAGNOSIS — D509 Iron deficiency anemia, unspecified: Secondary | ICD-10-CM | POA: Diagnosis not present

## 2021-12-30 DIAGNOSIS — Z114 Encounter for screening for human immunodeficiency virus [HIV]: Secondary | ICD-10-CM

## 2021-12-30 DIAGNOSIS — N760 Acute vaginitis: Secondary | ICD-10-CM

## 2021-12-30 NOTE — Progress Notes (Signed)
Established Patient Office Visit  Subjective    Patient ID: Tyleigh Mahn, female    DOB: 1990/12/10  Age: 31 y.o. MRN: [id number]  CC:  Chief Complaint  Patient presents with   Follow-up    HPI Ilena Dieckman presents for complaint of vaginal discharge and probable BV.    Outpatient Encounter Medications as of 12/30/2021  Medication Sig   Iron, Ferrous Sulfate, 325 (65 Fe) MG TABS Take 325 mg by mouth 2 (two) times daily.   No facility-administered encounter medications on file as of 12/30/2021.    Past Medical History:  Diagnosis Date   Anemia    Gestational thrombocytopenia ([place])    Gestational thrombocytopenia without hemorrhage in third trimester ([place]) 08/22/2019   Likely gestational though review of Care Everywhere records shows has intermittently had during non-pregnant times as well Most recent checks 129>115>91>133 on 08/22/2019 Nadir of 74 during admission for last delivery Recheck at 36-37 wks, consider heme consult if again dips below 100k   History of cesarean delivery 04/13/2019   VBAC consent signed 08/08/2019 Op note in Care Everywhere 03/15/2015   Inguinal hernia 2019   Mild preeclampsia 11/06/2019   Postoperative ileus ([place]) 11/08/2019   Scoliosis 2006    Past Surgical History:  Procedure Laterality Date   BREAST BIOPSY Left 08/29/2018   CESAREAN SECTION     CESAREAN SECTION  11/06/2019   Procedure: CESAREAN SECTION;  Surgeon: Griffin Basil, MD;  Location: [facility];  Service: Obstetrics;;    Family History  Problem Relation Age of Onset   Asthma Mother    Asthma Brother    Diabetes Maternal Grandmother     Social History   Socioeconomic History   Marital status: Single    Spouse name: Not on file   Number of children: Not on file   Years of education: Not on file   Highest education level: Not on file  Occupational History   Not on file  Tobacco Use   Smoking status: Never   Smokeless tobacco: Never  Vaping Use   Vaping Use: Never used   Substance and Sexual Activity   Alcohol use: Never   Drug use: Never   Sexual activity: Yes    Birth control/protection: None  Other Topics Concern   Not on file  Social History Narrative   Not on file   Social Determinants of Health   Financial Resource Strain: Not on file  Food Insecurity: No Food Insecurity (11/02/2019)   Hunger Vital Sign    Worried About Running Out of Food in the Last Year: Never true    Ran Out of Food in the Last Year: Never true  Transportation Needs: No Transportation Needs (11/02/2019)   PRAPARE - Hydrologist (Medical): No    Lack of Transportation (Non-Medical): No  Physical Activity: Not on file  Stress: Not on file  Social Connections: Not on file  Intimate Partner Violence: Not on file    Review of Systems  All other systems reviewed and are negative.       Objective    BP 113/72   Pulse 72   Temp 98.1 F (36.7 C) (Oral)   Resp 16   Wt 115 lb (52.2 kg)   LMP 12/02/2021   SpO2 98%   BMI 20.37 kg/m   Physical Exam Vitals and nursing note reviewed.  Constitutional:      General: She is not in acute distress. Cardiovascular:  Rate and Rhythm: Normal rate and regular rhythm.  Pulmonary:     Effort: Pulmonary effort is normal.     Breath sounds: Normal breath sounds.  Abdominal:     Palpations: Abdomen is soft.     Tenderness: There is no abdominal tenderness.  Neurological:     General: No focal deficit present.     Mental Status: She is alert and oriented to person, place, and time.         Assessment & Plan:   1. Iron deficiency anemia, unspecified iron deficiency anemia type Patient has stopped iron 2/2 GI. Will recheck labs - CBC with Differential  2. Vaginitis and vulvovaginitis Results pending.  - Cervicovaginal ancillary only    Return for prn.   Tommie Raymond, MD

## 2021-12-30 NOTE — Addendum Note (Signed)
Addended by: Melene Plan on: 12/30/2021 05:04 PM   Modules accepted: Orders

## 2021-12-31 LAB — CERVICOVAGINAL ANCILLARY ONLY
Bacterial Vaginitis (gardnerella): NEGATIVE
Candida Glabrata: NEGATIVE
Candida Vaginitis: NEGATIVE
Chlamydia: NEGATIVE
Comment: NEGATIVE
Comment: NEGATIVE
Comment: NEGATIVE
Comment: NEGATIVE
Comment: NEGATIVE
Comment: NORMAL
Neisseria Gonorrhea: NEGATIVE
Trichomonas: NEGATIVE

## 2021-12-31 LAB — CBC WITH DIFFERENTIAL/PLATELET
Basophils Absolute: 0.1 10*3/uL (ref 0.0–0.2)
Basos: 1 %
EOS (ABSOLUTE): 0.2 10*3/uL (ref 0.0–0.4)
Eos: 3 %
Hematocrit: 36.9 % (ref 34.0–46.6)
Hemoglobin: 11.5 g/dL (ref 11.1–15.9)
Immature Grans (Abs): 0 10*3/uL (ref 0.0–0.1)
Immature Granulocytes: 0 %
Lymphocytes Absolute: 1.8 10*3/uL (ref 0.7–3.1)
Lymphs: 37 %
MCH: 25.5 pg — ABNORMAL LOW (ref 26.6–33.0)
MCHC: 31.2 g/dL — ABNORMAL LOW (ref 31.5–35.7)
MCV: 82 fL (ref 79–97)
Monocytes Absolute: 0.6 10*3/uL (ref 0.1–0.9)
Monocytes: 11 %
Neutrophils Absolute: 2.4 10*3/uL (ref 1.4–7.0)
Neutrophils: 48 %
Platelets: 142 10*3/uL — ABNORMAL LOW (ref 150–450)
RBC: 4.51 x10E6/uL (ref 3.77–5.28)
RDW: 13.9 % (ref 11.7–15.4)
WBC: 5 10*3/uL (ref 3.4–10.8)

## 2021-12-31 LAB — HIV ANTIBODY (ROUTINE TESTING W REFLEX): HIV Screen 4th Generation wRfx: NONREACTIVE

## 2022-02-18 IMAGING — US US MFM OB COMP +14 WKS
1 series · 13 of 28 positions shown · non-contrast
Comparison: none

[Series 1: us mfm ob comp +14 wks · 13 of 111 slices shown]
[im 5/111]
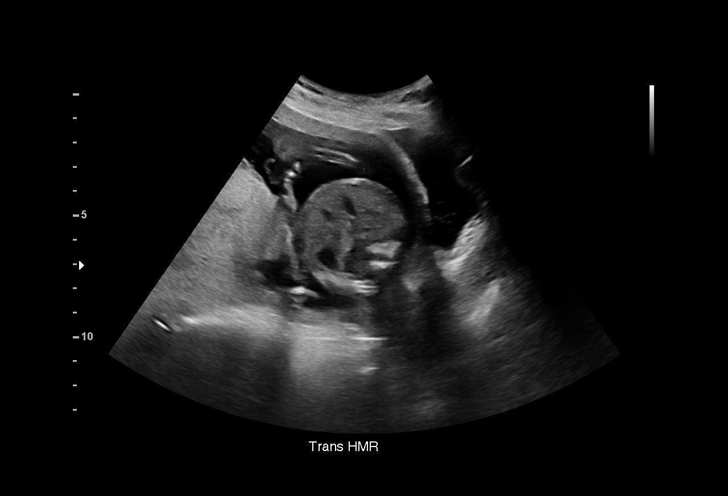
[im 13/111]
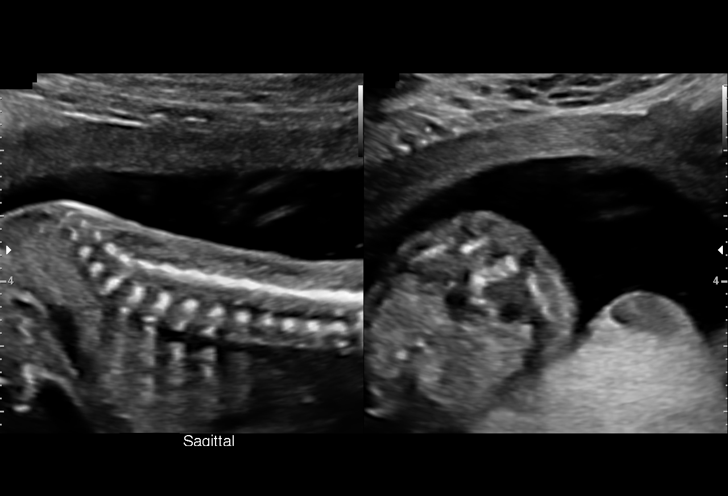
[im 21/111]
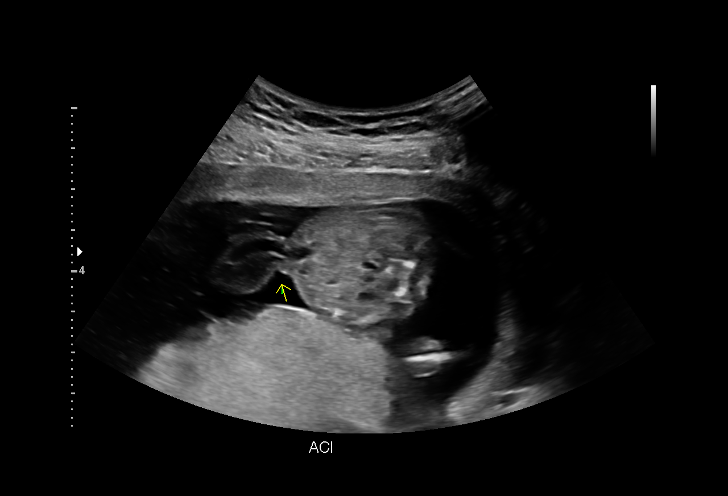
[im 29/111]
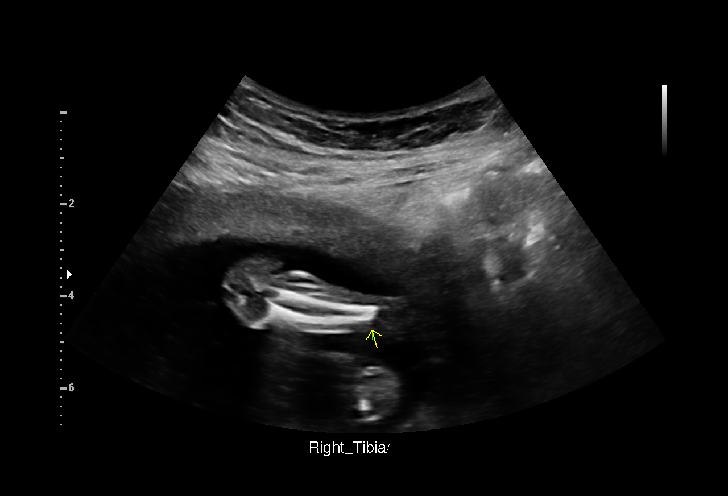
[im 37/111]
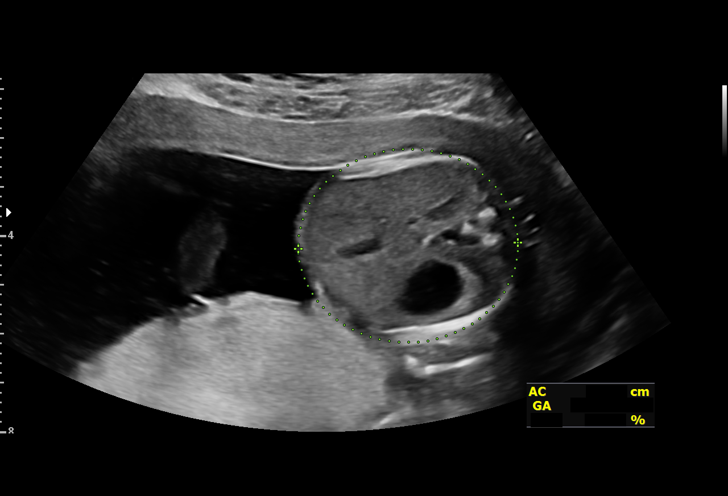
[im 45/111]
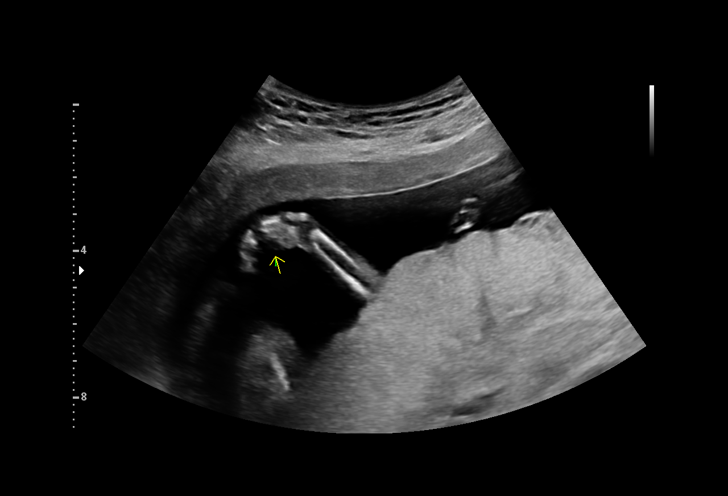
[im 58/111]
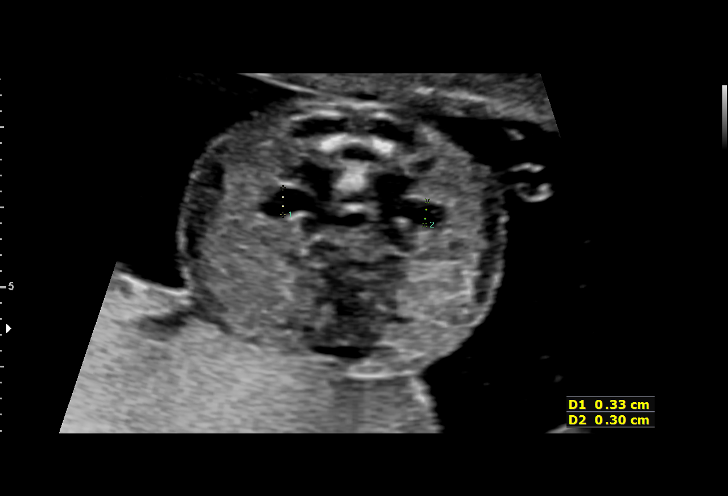
[im 66/111]
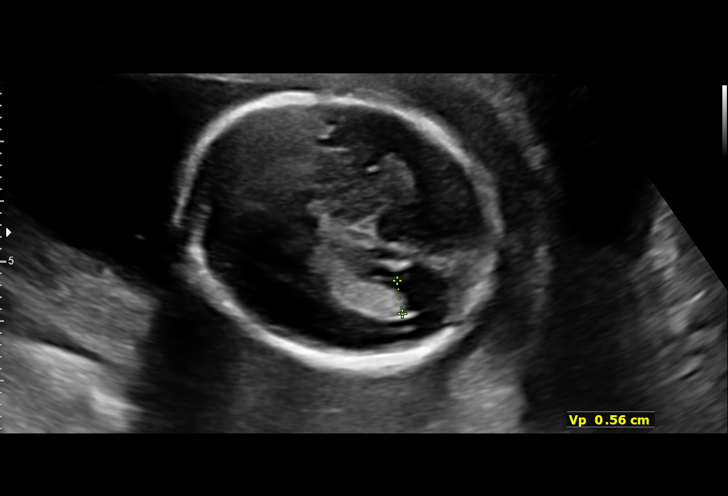
[im 74/111]
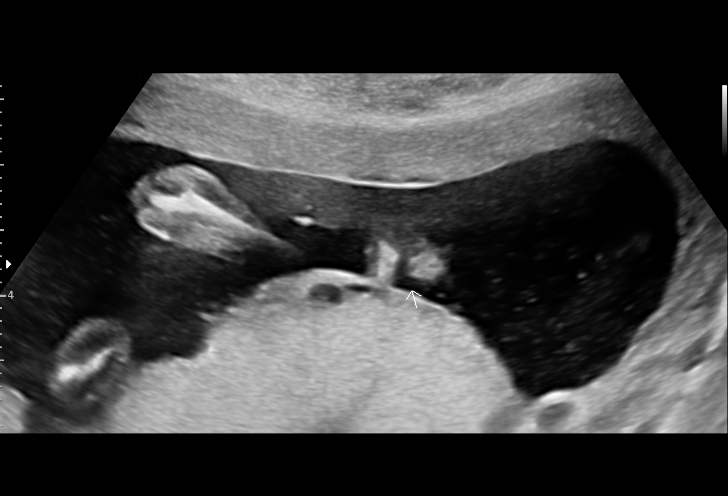
[im 82/111]
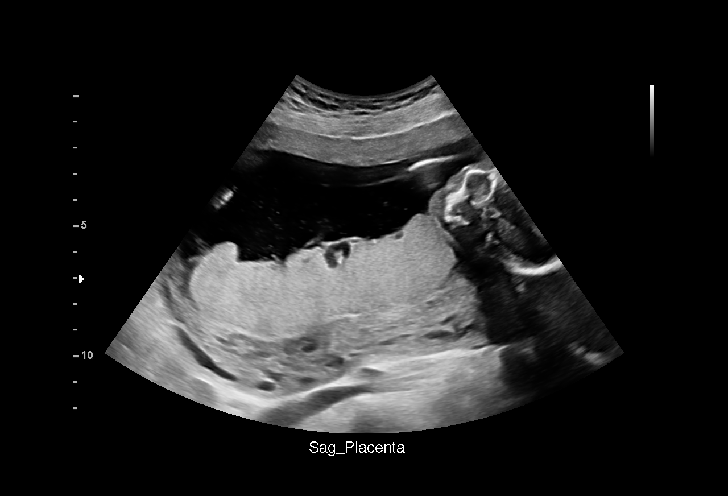
[im 90/111]
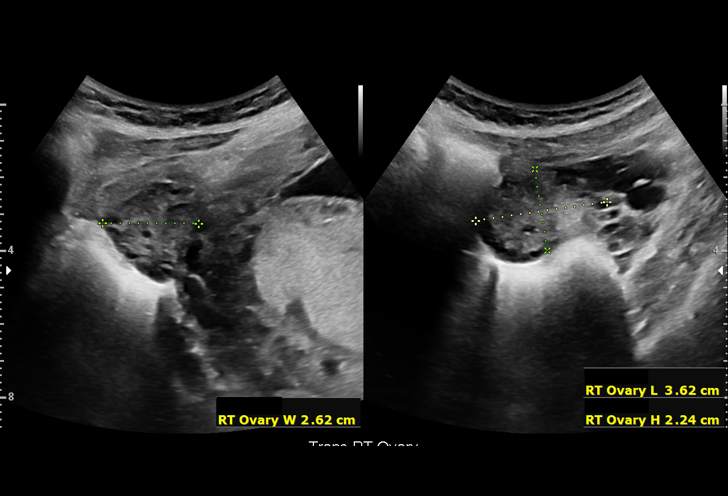
[im 98/111]
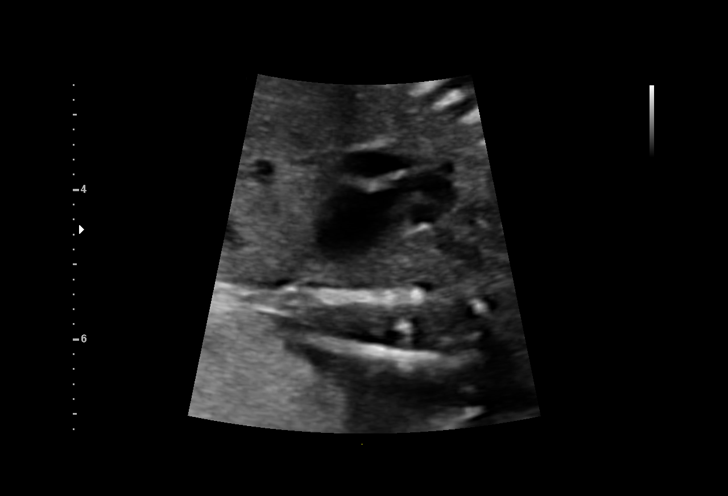
[im 106/111]
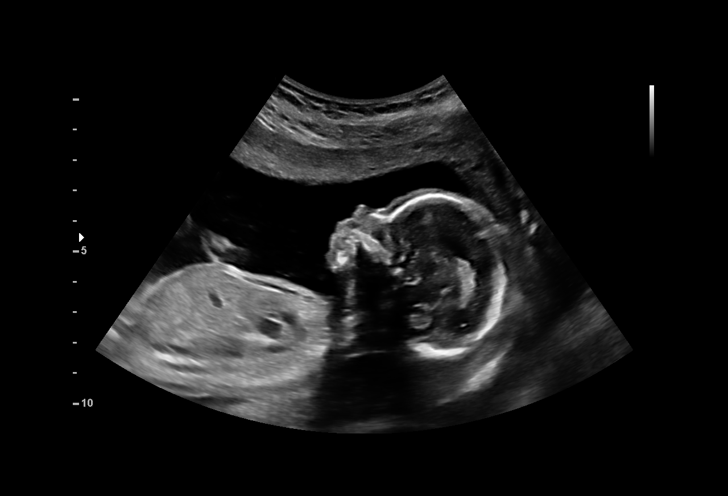

[13 of 28 positions shown; findings below may reference images not displayed]

AUJLA NP

 ----------------------------------------------------------------------

 ----------------------------------------------------------------------
Indications

  Encounter for antenatal screening for
  malformations
  18 weeks gestation of pregnancy
  History of cesarean delivery, currently
  pregnant
  Low Risk NIPS
 ----------------------------------------------------------------------
Fetal Evaluation

 Num Of Fetuses:         1
 Fetal Heart Rate(bpm):  148
 Cardiac Activity:       Observed
 Presentation:           Variable
 Placenta:               Posterior
 P. Cord Insertion:      Visualized

 Amniotic Fluid
 AFI FV:      Within normal limits

                             Largest Pocket(cm)

Biometry

 BPD:      42.4  mm     G. Age:  18w 6d         63  %    CI:        73.37   %    70 - 86
                                                         FL/HC:      17.6   %    16.1 -
 HC:      157.3  mm     G. Age:  18w 4d         45  %    HC/AC:      1.20        1.09 -
 AC:      130.7  mm     G. Age:  18w 4d         47  %    FL/BPD:     65.3   %
 FL:       27.7  mm     G. Age:  18w 3d         40  %    FL/AC:      21.2   %    20 - 24
 HUM:      26.3  mm     G. Age:  18w 2d         45  %
 CER:      19.7  mm     G. Age:  18w 6d         60  %
 NFT:       3.6  mm
 LV:        5.6  mm
 CM:        3.7  mm

 Est. FW:     246  gm      0 lb 9 oz     45  %
OB History

 Gravidity:    3         Term:   1         SAB:   1
 Living:       1
Gestational Age

 LMP:           18w 4d        Date:  01/29/19                 EDD:   11/05/19
 U/S Today:     18w 4d                                        EDD:   11/05/19
 Best:          18w 4d     Det. By:  LMP  (01/29/19)          EDD:   11/05/19
Anatomy

 Cranium:               Appears normal         LVOT:                   Appears normal
 Cavum:                 Appears normal         Aortic Arch:            Appears normal
 Ventricles:            Appears normal         Ductal Arch:            Appears normal
 Choroid Plexus:        Appears normal         Diaphragm:              Appears normal
 Cerebellum:            Appears normal         Stomach:                Appears normal, left
                                                                       sided
 Posterior Fossa:       Appears normal         Abdomen:                Appears normal
 Nuchal Fold:           Appears normal         Abdominal Wall:         Appears nml (cord
                                                                       insert, abd wall)
 Face:                  Appears normal         Cord Vessels:           Appears normal (3
                        (orbits and profile)                           vessel cord)
 Lips:                  Appears normal         Kidneys:                Appear normal
 Palate:                Not well visualized    Bladder:                Appears normal
 Thoracic:              Appears normal         Spine:                  Appears normal
 Heart:                 Appears normal         Upper Extremities:      Appears normal
                        (4CH, axis, and
                        situs)
 RVOT:                  Appears normal         Lower Extremities:      Appears normal

 Other:  Male gender. Open hands visualized. Heels and 5th digit visualized.
Cervix Uterus Adnexa

 Cervix
 Length:            4.4  cm.
 Normal appearance by transabdominal scan.

 Uterus
 No abnormality visualized.

 Left Ovary
 Size(cm)       2.9  x   1.9    x  2.2       Vol(ml):
 Within normal limits.

 Right Ovary
 Size(cm)       3.6  x   2.2    x  2.6       Vol(ml):
 Within normal limits.
Impression

 We performed fetal anatomy scan. No makers of
 aneuploidies or fetal structural defects are seen. Fetal
 biometry is consistent with her previously-established dates.
 Amniotic fluid is normal and good fetal activity is seen.
 Patient understands the limitations of ultrasound in detecting
 fetal anomalies.
 Placenta is posterior and there is no evidence of previa or
 accreta.

 On cell-free fetal DNA screening, the risks of fetal
 aneuploidies are not increased.
Recommendations

 Follow-up scans as clinically indicated.
                 Taab, [place]hun

## 2022-03-04 ENCOUNTER — Ambulatory Visit (INDEPENDENT_AMBULATORY_CARE_PROVIDER_SITE_OTHER): Payer: Commercial Managed Care - PPO | Admitting: *Deleted

## 2022-03-04 DIAGNOSIS — Z3200 Encounter for pregnancy test, result unknown: Secondary | ICD-10-CM

## 2022-03-04 DIAGNOSIS — Z3201 Encounter for pregnancy test, result positive: Secondary | ICD-10-CM

## 2022-03-04 LAB — POCT PREGNANCY, URINE: Preg Test, Ur: POSITIVE — AB

## 2022-03-04 NOTE — Progress Notes (Signed)
Patient dropped off a urine for a pregnancy test which was positive. I called her and informed her test was positive and she is pregnant. She reports sure LMP of February 01, 2022 and was a normal period. She also confided she is not planning to keep pregnancy and what is her next step. I explained we do not do pregnancy terminations here and I cannot advice and that she should pursue agencies of her choice. I did tell her of some agencies she can call. She voices understanding. Nancy Fetter

## 2022-03-09 ENCOUNTER — Inpatient Hospital Stay (HOSPITAL_COMMUNITY): Payer: Commercial Managed Care - PPO

## 2022-03-09 ENCOUNTER — Telehealth: Payer: Self-pay | Admitting: Internal Medicine

## 2022-03-09 ENCOUNTER — Encounter (HOSPITAL_COMMUNITY): Payer: Self-pay | Admitting: Obstetrics and Gynecology

## 2022-03-09 ENCOUNTER — Inpatient Hospital Stay (HOSPITAL_COMMUNITY)
Admission: AD | Admit: 2022-03-09 | Discharge: 2022-03-09 | Disposition: A | Discharge status: Home / Self Care | Payer: Commercial Managed Care - PPO | Attending: Obstetrics and Gynecology | Admitting: Obstetrics and Gynecology

## 2022-03-09 DIAGNOSIS — R109 Unspecified abdominal pain: Secondary | ICD-10-CM | POA: Diagnosis not present

## 2022-03-09 DIAGNOSIS — Z3A01 Less than 8 weeks gestation of pregnancy: Secondary | ICD-10-CM | POA: Insufficient documentation

## 2022-03-09 DIAGNOSIS — O26891 Other specified pregnancy related conditions, first trimester: Secondary | ICD-10-CM | POA: Diagnosis present

## 2022-03-09 DIAGNOSIS — R519 Headache, unspecified: Secondary | ICD-10-CM | POA: Diagnosis not present

## 2022-03-09 DIAGNOSIS — M545 Low back pain, unspecified: Secondary | ICD-10-CM | POA: Insufficient documentation

## 2022-03-09 DIAGNOSIS — O209 Hemorrhage in early pregnancy, unspecified: Secondary | ICD-10-CM | POA: Diagnosis not present

## 2022-03-09 DIAGNOSIS — Z3A00 Weeks of gestation of pregnancy not specified: Secondary | ICD-10-CM

## 2022-03-09 DIAGNOSIS — R1030 Lower abdominal pain, unspecified: Secondary | ICD-10-CM | POA: Diagnosis not present

## 2022-03-09 DIAGNOSIS — Z8759 Personal history of other complications of pregnancy, childbirth and the puerperium: Secondary | ICD-10-CM | POA: Insufficient documentation

## 2022-03-09 DIAGNOSIS — Z3490 Encounter for supervision of normal pregnancy, unspecified, unspecified trimester: Secondary | ICD-10-CM

## 2022-03-09 LAB — COMPREHENSIVE METABOLIC PANEL
ALT: 13 U/L (ref 0–44)
AST: 17 U/L (ref 15–41)
Albumin: 4 g/dL (ref 3.5–5.0)
Alkaline Phosphatase: 59 U/L (ref 38–126)
Anion gap: 9 (ref 5–15)
BUN: 12 mg/dL (ref 6–20)
CO2: 24 mmol/L (ref 22–32)
Calcium: 9.1 mg/dL (ref 8.9–10.3)
Chloride: 103 mmol/L (ref 98–111)
Creatinine, Ser: 0.68 mg/dL (ref 0.44–1.00)
GFR, Estimated: >60 mL/min (ref >60)
Glucose, Bld: 93 mg/dL (ref 70–99)
Potassium: 3.7 mmol/L (ref 3.5–5.1)
Sodium: 136 mmol/L (ref 135–145)
Total Bilirubin: 0.4 mg/dL (ref 0.3–1.2)
Total Protein: 7.6 g/dL (ref 6.5–8.1)

## 2022-03-09 LAB — CBC
HCT: 35.2 % — ABNORMAL LOW (ref 36.0–46.0)
Hemoglobin: 10.6 g/dL — ABNORMAL LOW (ref 12.0–15.0)
MCH: 24.1 pg — ABNORMAL LOW (ref 26.0–34.0)
MCHC: 30.1 g/dL (ref 30.0–36.0)
MCV: 80.2 fL (ref 80.0–100.0)
Platelets: 172 10*3/uL (ref 150–400)
RBC: 4.39 MIL/uL (ref 3.87–5.11)
RDW: 14.7 % (ref 11.5–15.5)
WBC: 6.7 10*3/uL (ref 4.0–10.5)
nRBC: 0 % (ref 0.0–0.2)

## 2022-03-09 LAB — TYPE AND SCREEN
ABO/RH(D): O POS
Antibody Screen: NEGATIVE

## 2022-03-09 LAB — HCG, QUANTITATIVE, PREGNANCY: hCG, Beta Chain, Quant, S: 20629 m[IU]/mL — ABNORMAL HIGH (ref <5)

## 2022-03-09 NOTE — Telephone Encounter (Signed)
Returned patient's call. Patient reports she is in need of an [place]. She reports that there is a concern that she may have an ectopic pregnancy. She did not have anything in the Uterus.   She went to [facility] on 11/25 and was told there was nothing in the Uterus. She is having some cramping in the mid abdomen, none to the side. No bleeding. Middle of back is hurting x 2 days and headaches are present.    Spoke with Dr. Crissie Reese at [facility] and he recommended patient go to MAU for evaluation for Ectopic. Conveyed to patient. Informed her I would recommend you go in ASAP. Patient reports she is working until 4:30 and then will report to MAU.

## 2022-03-09 NOTE — Telephone Encounter (Signed)
Patient said she went to a clinic over the weekend and was told to call here to get scheduled for a Ultra Sound.

## 2022-03-09 NOTE — MAU Note (Signed)
Michelle Velez is a 31 y.o. at [redacted]w[redacted]d here in MAU reporting: on Saturday went to have an abortion, they were unable to see anything on [place].  Started spotting around 1615, light pink.  Having some cramping, lower abd in the middle,  feels like period is coming. Having some pain in lower back - spine.  Also has been nauseated and having Headaches(not now).   Onset of complaint: 2 days ago Pain score: mild- abd, back5 Vitals:   03/09/22 1803  BP: 124/66  Pulse: 75  Resp: 16  Temp: 98.6 F (37 C)  SpO2: 100%     Lab orders placed from triage:

## 2022-03-09 NOTE — MAU Provider Note (Signed)
History     CSN: [id number]  Arrival date and time: 03/09/22 1732   Event Date/Time   First Provider Initiated Contact with Patient 03/09/22 1805      Chief Complaint  Patient presents with   Abdominal Pain   Back Pain   Vaginal Bleeding   Nausea   HPI Michelle Velez is a 31 y.o. [id number] in early pregnancy. She presents to MAU for evaluation of recurrent abdominal and low back pain. These are recurrent complaints, onset two days ago. Her abdominal pain is "mild" and manageable but her low back pain feels more intense and has a pain score of 5/10.   Patient endorses new onset spotting around 1615 today. Her spotting is light and pink. She is not seeing frank red vaginal bleeding. She is not saturating pads. She is not experiencing weakness or syncope.  Patient reports recurrent mild headache. She has not taken medication for this complaint and declines pain medication during MAU encounter.  Patient is planning elective termination and declines counseling related to [facility].  OB History     Gravida  4   Para  2   Term  2   Preterm      AB  1   Living  2      SAB  1   IAB      Ectopic      Multiple  0   Live Births  2           Past Medical History:  Diagnosis Date   Anemia    Gestational thrombocytopenia ([facility])    Gestational thrombocytopenia without hemorrhage in third trimester ([facility]) 08/22/2019   Likely gestational though review of Care Everywhere records shows has intermittently had during non-pregnant times as well Most recent checks 129>115>91>133 on 08/22/2019 Nadir of 74 during admission for last delivery Recheck at 36-37 wks, consider heme consult if again dips below 100k   History of cesarean delivery 04/13/2019   VBAC consent signed 08/08/2019 Op note in Care Everywhere 03/15/2015   Inguinal hernia 2019   Mild preeclampsia 11/06/2019   Postoperative ileus ([facility]) 11/08/2019   Scoliosis 2006    Past Surgical History:  Procedure Laterality Date    BREAST BIOPSY Left 08/29/2018   CESAREAN SECTION     CESAREAN SECTION  11/06/2019   Procedure: CESAREAN SECTION;  Surgeon: Warden Fillers, MD;  Location: [facility] LD [facility];  Service: Obstetrics;;    Family History  Problem Relation Age of Onset   Asthma Mother    Asthma Brother    Diabetes Maternal Grandmother     Social History   Tobacco Use   Smoking status: Never   Smokeless tobacco: Never  Vaping Use   Vaping Use: Never used  Substance Use Topics   Alcohol use: Never   Drug use: Never    Allergies: No Known Allergies  Medications Prior to Admission  Medication Sig Dispense Refill Last Dose   Iron, Ferrous Sulfate, 325 (65 Fe) MG TABS Take 325 mg by mouth 2 (two) times daily. 60 tablet 3     Review of Systems  Gastrointestinal:  Positive for abdominal pain.  Genitourinary:  Positive for vaginal bleeding.  Musculoskeletal:  Positive for back pain.  All other systems reviewed and are negative.  Physical Exam   Blood pressure 124/66, pulse 75, temperature 98.6 F (37 C), temperature source Oral, resp. rate 16, height 5\' 2"  (1.575 m), weight 53.1 kg, last menstrual period 02/01/2022, SpO2 100 %, currently breastfeeding.  Physical Exam Vitals and nursing note reviewed. Exam conducted with a chaperone present.  Constitutional:      Appearance: She is well-developed. She is not ill-appearing.  Cardiovascular:     Rate and Rhythm: Normal rate.  Pulmonary:     Effort: Pulmonary effort is normal.  Abdominal:     General: Abdomen is flat.     Palpations: Abdomen is soft.     Tenderness: There is no abdominal tenderness.  Skin:    Capillary Refill: Capillary refill takes less than 2 seconds.  Neurological:     Mental Status: She is alert and oriented to person, place, and time.  Psychiatric:        Mood and Affect: Mood normal.        Behavior: Behavior normal.     MAU Course  Procedures  MDM   Orders Placed This Encounter  Procedures   US OB Transvaginal    CBC   hCG, quantitative, pregnancy   Comprehensive metabolic panel   Diet NPO time specified   Type and screen [facility]   Discharge patient   Results for orders placed or performed during the hospital encounter of 03/09/22 (from the past 24 hour(s))  CBC     Status: Abnormal   Collection Time: 03/09/22  6:17 PM  Result Value Ref Range   WBC 6.7 4.0 - 10.5 K/uL   RBC 4.39 3.87 - 5.11 MIL/uL   Hemoglobin 10.6 (L) 12.0 - 15.0 g/dL   HCT 08.6 (L) 76.1 - 95.0 %   MCV 80.2 80.0 - 100.0 fL   MCH 24.1 (L) 26.0 - 34.0 pg   MCHC 30.1 30.0 - 36.0 g/dL   RDW 93.2 67.1 - 24.5 %   Platelets 172 150 - 400 K/uL   nRBC 0.0 0.0 - 0.2 %  Comprehensive metabolic panel     Status: None   Collection Time: 03/09/22  6:17 PM  Result Value Ref Range   Sodium 136 135 - 145 mmol/L   Potassium 3.7 3.5 - 5.1 mmol/L   Chloride 103 98 - 111 mmol/L   CO2 24 22 - 32 mmol/L   Glucose, Bld 93 70 - 99 mg/dL   BUN 12 6 - 20 mg/dL   Creatinine, Ser 8.09 0.44 - 1.00 mg/dL   Calcium 9.1 8.9 - 98.3 mg/dL   Total Protein 7.6 6.5 - 8.1 g/dL   Albumin 4.0 3.5 - 5.0 g/dL   AST 17 15 - 41 U/L   ALT 13 0 - 44 U/L   Alkaline Phosphatase 59 38 - 126 U/L   Total Bilirubin 0.4 0.3 - 1.2 mg/dL   GFR, Estimated >38 >25 mL/min   Anion gap 9 5 - 15  Type and screen [facility]     Status: None   Collection Time: 03/09/22  6:17 PM  Result Value Ref Range   ABO/RH(D) O POS    Antibody Screen NEG    Sample Expiration      03/12/2022,2359 Performed at [facility], [address], [place], [place] [address]    US OB Transvaginal  Result Date: 03/09/2022 CLINICAL DATA:  Abdominal pain EXAM: OBSTETRIC <14 WK [place] AND TRANSVAGINAL OB US TECHNIQUE: Both transabdominal and transvaginal ultrasound examinations were performed for complete evaluation of the gestation as well as the maternal uterus, adnexal regions, and pelvic cul-de-sac. Transvaginal technique was performed to assess early  pregnancy. COMPARISON:  None Available. FINDINGS: Intrauterine gestational sac: Single Yolk sac:  Visualized. Embryo:  Not Visualized. Cardiac Activity: Not Visualized. Heart Rate: Not applicable bpm MSD: 10.8 mm   5 w   6 d CRL:    mm    w    d                  [place] EDC: Subchorionic hemorrhage:  None visualized. Maternal uterus/adnexae: Right ovary was not visualized. Normal left ovary. No free fluid. IMPRESSION: Early intrauterine gestational sac and yolk sac. No fetal pole or cardiac activity yet visualized. Recommend follow-up quantitative B-HCG levels and follow-up US as clinically indicated. Electronically Signed   By: Minerva Fester M.D.   On: 03/09/2022 19:00    Assessment and Plan  --31 y.o. [id number] with IUP (+ GS, + YS) --VSS, Hgb 10.6 --Discharge home in stable condition  Calvert Cantor, MSA, MSN, CNM 03/09/2022, 8:03 PM

## 2022-03-19 ENCOUNTER — Telehealth: Payer: Self-pay | Admitting: *Deleted

## 2022-03-19 NOTE — Telephone Encounter (Signed)
Pt left VM message stating that she is having vaginal d/c since last seen @ the hospital on 11/27. The d/c is black and she is worried. Pt stated that she is pregnant. She wants to know what to do. I called pt and she did not answer. I left a message stating that I am returning her call and will call again later today.

## 2022-03-23 NOTE — Telephone Encounter (Signed)
Called patient stating I am trying to reach her to return her phone call. Stated I know she called the other day and when we called back we were unable to reach her. Asked patient if she still needs assistance and she states no she is fine. Patient had no questions.

## 2022-06-20 ENCOUNTER — Ambulatory Visit (INDEPENDENT_AMBULATORY_CARE_PROVIDER_SITE_OTHER): Payer: 59

## 2022-06-20 ENCOUNTER — Ambulatory Visit
Admission: EM | Admit: 2022-06-20 | Discharge: 2022-06-20 | Disposition: A | Discharge status: Home / Self Care | Payer: 59 | Attending: Internal Medicine | Admitting: Internal Medicine

## 2022-06-20 DIAGNOSIS — Z3202 Encounter for pregnancy test, result negative: Secondary | ICD-10-CM | POA: Diagnosis present

## 2022-06-20 DIAGNOSIS — N200 Calculus of kidney: Secondary | ICD-10-CM | POA: Diagnosis present

## 2022-06-20 DIAGNOSIS — R109 Unspecified abdominal pain: Secondary | ICD-10-CM | POA: Diagnosis not present

## 2022-06-20 DIAGNOSIS — R350 Frequency of micturition: Secondary | ICD-10-CM | POA: Diagnosis not present

## 2022-06-20 DIAGNOSIS — R310 Gross hematuria: Secondary | ICD-10-CM | POA: Insufficient documentation

## 2022-06-20 LAB — POCT URINALYSIS DIP (MANUAL ENTRY)
Bilirubin, UA: NEGATIVE
Glucose, UA: NEGATIVE mg/dL
Ketones, POC UA: NEGATIVE mg/dL
Leukocytes, UA: NEGATIVE
Nitrite, UA: NEGATIVE
Protein Ur, POC: 30 mg/dL — AB
Spec Grav, UA: 1.02 (ref 1.010–1.025)
Urobilinogen, UA: 0.2 E.U./dL — AB
pH, UA: 7 (ref 5.0–8.0)

## 2022-06-20 LAB — POCT URINE PREGNANCY: Preg Test, Ur: NEGATIVE

## 2022-06-20 NOTE — ED Provider Notes (Addendum)
[name]    CSN: [id number] Arrival date & time: 06/20/22  1406      History   Chief Complaint Chief Complaint  Patient presents with   Urinary Frequency   Hematuria    HPI Michelle Velez is a 32 y.o. female.   Patient presents with dark-colored urine, hematuria, urinary frequency that started today.  Denies dysuria, vaginal discharge, abdominal pain, fever, pelvic pain.  Reports some right lower back pain that only occurs with movement so she is not sure if it is related.  Has not taken any medications for symptoms.  Denies any confirmed exposure to STD or any unprotected intercourse.  Last menstrual cycle was at the end of February.  Patient was pregnant at the end of 2023 but reports that the pregnancy was terminated.    Urinary Frequency  Hematuria    Past Medical History:  Diagnosis Date   Anemia    Gestational thrombocytopenia ([place])    Gestational thrombocytopenia without hemorrhage in third trimester ([place]) 08/22/2019   Likely gestational though review of Care Everywhere records shows has intermittently had during non-pregnant times as well Most recent checks 129>115>91>133 on 08/22/2019 Nadir of 74 during admission for last delivery Recheck at 36-37 wks, consider heme consult if again dips below 100k   History of cesarean delivery 04/13/2019   VBAC consent signed 08/08/2019 Op note in Care Everywhere 03/15/2015   Inguinal hernia 2019   Mild preeclampsia 11/06/2019   Postoperative ileus ([place]) 11/08/2019   Scoliosis 2006    Patient Active Problem List   Diagnosis Date Noted   Vitamin D deficiency 09/17/2017   Unilateral inguinal hernia 06/16/2017   Eczema 05/19/2017   Gynecologic exam normal 05/19/2017   Iron deficiency anemia 05/19/2017   Routine history and physical examination of adult 05/19/2017   Normal pregnancy 03/14/2015    Past Surgical History:  Procedure Laterality Date   BREAST BIOPSY Left 08/29/2018   CESAREAN SECTION     CESAREAN  SECTION  11/06/2019   Procedure: CESAREAN SECTION;  Surgeon: Griffin Basil, MD;  Location: [facility] LD ORS;  Service: Obstetrics;;   DILATION AND CURETTAGE OF UTERUS      OB History     Gravida  4   Para  2   Term  2   Preterm      AB  1   Living  2      SAB  1   IAB      Ectopic      Multiple  0   Live Births  2            Home Medications    Prior to Admission medications   Medication Sig Start Date End Date Taking? Authorizing Provider  Iron, Ferrous Sulfate, 325 (65 Fe) MG TABS Take 325 mg by mouth 2 (two) times daily. 07/02/21   Dorna Mai, MD    Family History Family History  Problem Relation Age of Onset   Arthritis Mother    Diabetes Mother    Asthma Mother    Other Father        unknown   Asthma Brother    Diabetes Maternal Grandmother     Social History Social History   Tobacco Use   Smoking status: Never   Smokeless tobacco: Never  Vaping Use   Vaping Use: Never used  Substance Use Topics   Alcohol use: Never   Drug use: Never     Allergies   Patient has no  known allergies.   Review of Systems Review of Systems Per HPI  Physical Exam Triage Vital Signs ED Triage Vitals  Enc Vitals Group     BP 06/20/22 1631 131/81     Pulse Rate 06/20/22 1631 67     Resp 06/20/22 1631 20     Temp 06/20/22 1631 97.9 F (36.6 C)     Temp Source 06/20/22 1631 Oral     SpO2 06/20/22 1631 99 %     Weight --      Height --      Head Circumference --      Peak Flow --      Pain Score 06/20/22 1638 0     Pain Loc --      Pain Edu? --      Excl. in Veteran? --    No data found.  Updated Vital Signs BP 131/81 (BP Location: Left Arm)   Pulse 67   Temp 97.9 F (36.6 C) (Oral)   Resp 20   LMP 06/07/2022   SpO2 99%   Breastfeeding Yes   Visual Acuity Right Eye Distance:   Left Eye Distance:   Bilateral Distance:    Right Eye Near:   Left Eye Near:    Bilateral Near:     Physical Exam Constitutional:      General: She is  not in acute distress.    Appearance: Normal appearance. She is not toxic-appearing or diaphoretic.  HENT:     Head: Normocephalic and atraumatic.  Eyes:     Extraocular Movements: Extraocular movements intact.     Conjunctiva/sclera: Conjunctivae normal.  Cardiovascular:     Rate and Rhythm: Normal rate and regular rhythm.     Pulses: Normal pulses.     Heart sounds: Normal heart sounds.  Pulmonary:     Effort: Pulmonary effort is normal. No respiratory distress.     Breath sounds: Normal breath sounds.  Abdominal:     General: Bowel sounds are normal. There is no distension.     Palpations: Abdomen is soft.     Tenderness: There is no abdominal tenderness. There is no right CVA tenderness or left CVA tenderness.  Genitourinary:    Comments: Deferred with shared decision making. Self swab performed.  Neurological:     General: No focal deficit present.     Mental Status: She is alert and oriented to person, place, and time. Mental status is at baseline.  Psychiatric:        Mood and Affect: Mood normal.        Behavior: Behavior normal.        Thought Content: Thought content normal.        Judgment: Judgment normal.      UC Treatments / Results  Labs (all labs ordered are listed, but only abnormal results are displayed) Labs Reviewed  POCT URINALYSIS DIP (MANUAL ENTRY) - Abnormal; Notable for the following components:      Result Value   Color, UA other (*)    Clarity, UA cloudy (*)    Blood, UA large (*)    Protein Ur, POC =30 (*)    Urobilinogen, UA 0.2 (*)    All other components within normal limits  CBC  COMPREHENSIVE METABOLIC PANEL  POCT URINE PREGNANCY  CERVICOVAGINAL ANCILLARY ONLY    EKG   Radiology DG Abdomen 1 View  Result Date: 06/20/2022 CLINICAL DATA:  Flank pain. EXAM: ABDOMEN - 1 VIEW COMPARISON:  CT abdomen pelvis 12/29/2020 FINDINGS:  Unremarkable bowel gas pattern. 2 mm punctate calcific density right hemiabdomen. Osseous structures are  unremarkable. IMPRESSION: 1. 2 mm punctate calcific density right hemiabdomen, nonspecific, small renal stone not excluded. 2. Nonobstructive bowel gas pattern. Electronically Signed   By: Lovey Newcomer M.D.   On: 06/20/2022 17:14    Procedures Procedures (including critical care time)  Medications Ordered in UC Medications - No data to display  Initial Impression / Assessment and Plan / UC Course  I have reviewed the triage vital signs and the nursing notes.  Pertinent labs & imaging results that were available during my care of the patient were reviewed by me and considered in my medical decision making (see chart for details).     UA unremarkable for infection.  KUB shows possible small right renal stone.  This is most likely etiology of patient's symptoms.  Although, she is not reporting significant pain and is still able to urinate, so patient declined Flomax with shared decision making.  I also think it is reasonable to not do Flomax given no significant pain.  Patient advised to follow-up with urology at provided contact information on Monday to schedule appointment for further evaluation.  Patient does have gross hematuria.  Will obtain CMP and CBC to rule out any worrisome etiology.  Other abnormalities on UA most likely due to low p.o. intake.  No concern for dehydration at this time.  Will obtain cervicovaginal swab to ensure vaginitis is not cause of symptoms as well.  Encouraged increasing clear oral fluid intake.  Encouraged patient strict ER precautions and following up with urology at provided contact information for further evaluation and management.  Patient verbalized understanding and was agreeable with plan. Final Clinical Impressions(s) / UC Diagnoses   Final diagnoses:  Urinary frequency  Gross hematuria  Urine pregnancy test negative  Right renal stone     Discharge Instructions      Urine did not show signs of infection.  Recommended increasing fluid intake.  X-ray  shows possible kidney stone.  Follow-up with urology on Monday to schedule appointment for further evaluation.  Blood work is pending.  Will call if it is abnormal.  Follow-up with urology specialist at provided contact information on Monday to schedule appointment for further evaluation and management.     ED Prescriptions   None    PDMP not reviewed this encounter.   Teodora Medici, [place] 06/20/22 [place], [place], [place] 06/20/22 1726

## 2022-06-20 NOTE — Discharge Instructions (Addendum)
Urine did not show signs of infection.  Recommended increasing fluid intake.  X-ray shows possible kidney stone.  Follow-up with urology on Monday to schedule appointment for further evaluation.  Blood work is pending.  Will call if it is abnormal.  Follow-up with urology specialist at provided contact information on Monday to schedule appointment for further evaluation and management.

## 2022-06-20 NOTE — ED Triage Notes (Signed)
Patient presents to UC for possible dehydration. She states the color of urine is dark and possible hematuria, urinary freq since today. States hx of kidney infection.  Has not taken any OTC medications.

## 2022-06-21 LAB — COMPREHENSIVE METABOLIC PANEL
ALT: 13 IU/L (ref 0–32)
AST: 21 IU/L (ref 0–40)
Albumin/Globulin Ratio: 1.8 (ref 1.2–2.2)
Albumin: 4.8 g/dL (ref 3.9–4.9)
Alkaline Phosphatase: 88 IU/L (ref 44–121)
BUN/Creatinine Ratio: 15 (ref 9–23)
BUN: 11 mg/dL (ref 6–20)
Bilirubin Total: <0.2 mg/dL (ref 0.0–1.2)
CO2: 23 mmol/L (ref 20–29)
Calcium: 9.5 mg/dL (ref 8.7–10.2)
Chloride: 102 mmol/L (ref 96–106)
Creatinine, Ser: 0.72 mg/dL (ref 0.57–1.00)
Globulin, Total: 2.7 g/dL (ref 1.5–4.5)
Glucose: 89 mg/dL (ref 70–99)
Potassium: 4.3 mmol/L (ref 3.5–5.2)
Sodium: 139 mmol/L (ref 134–144)
Total Protein: 7.5 g/dL (ref 6.0–8.5)
eGFR: 113 mL/min/{1.73_m2} (ref >59)

## 2022-06-21 LAB — CBC
Hematocrit: 35.5 % (ref 34.0–46.6)
Hemoglobin: 10.8 g/dL — ABNORMAL LOW (ref 11.1–15.9)
MCH: 23.7 pg — ABNORMAL LOW (ref 26.6–33.0)
MCHC: 30.4 g/dL — ABNORMAL LOW (ref 31.5–35.7)
MCV: 78 fL — ABNORMAL LOW (ref 79–97)
Platelets: 205 10*3/uL (ref 150–450)
RBC: 4.55 x10E6/uL (ref 3.77–5.28)
RDW: 16 % — ABNORMAL HIGH (ref 11.7–15.4)
WBC: 4.5 10*3/uL (ref 3.4–10.8)

## 2022-06-22 LAB — CERVICOVAGINAL ANCILLARY ONLY
Bacterial Vaginitis (gardnerella): NEGATIVE
Candida Glabrata: NEGATIVE
Candida Vaginitis: NEGATIVE
Chlamydia: NEGATIVE
Comment: NEGATIVE
Comment: NEGATIVE
Comment: NEGATIVE
Comment: NEGATIVE
Comment: NEGATIVE
Comment: NORMAL
Neisseria Gonorrhea: NEGATIVE
Trichomonas: NEGATIVE

## 2022-10-09 ENCOUNTER — Ambulatory Visit: Payer: Self-pay

## 2022-10-09 NOTE — Telephone Encounter (Signed)
  Chief Complaint: anxiety and near panic over worry she may have Bra Ca gene mutation - mother recently dx ovarian ca Symptoms: h/o constipation Frequency: ongoing  Pertinent Negatives: Patient denies SI,  Disposition: [] ED /[] Urgent Care (no appt availability in office) / [x] Appointment(In office/virtual)/ []  [place] [facility]/ [] Home Care/ [] Refused Recommended Disposition /[] [place] [facility]/ []  Follow-up with PCP Additional Notes: pt has OBGYN stated she will call to make appt and call me back to update -  Reason for Disposition  [1] Symptoms of anxiety or panic attack AND [2] is a chronic symptom (recurrent or ongoing AND present > 4 weeks)  Answer Assessment - Initial Assessment Questions 1. CONCERN: "Did anything happen that prompted you to call today?"      Mother recent cancer dx- felt panic  2. ANXIETY SYMPTOMS: "Can you describe how you (your loved one; patient) have been feeling?" (e.g., tense, restless, panicky, anxious, keyed up, overwhelmed, sense of impending doom).      Panicky, anxiety - feel like mind is racing   3. ONSET: "How long have you been feeling this way?" (e.g., hours, days, weeks)     Yesterday  4. SEVERITY: "How would you rate the level of anxiety?" (e.g., 0 - 10; or mild, moderate, severe).     Moderate to severe 5. FUNCTIONAL IMPAIRMENT: "How have these feelings affected your ability to do daily activities?" "Have you had more difficulty than usual doing your normal daily activities?" (e.g., getting better, same, worse; self-care, school, work, interactions)     Feels so anxious worried about her own health  6. HISTORY: "Have you felt this way before?" "Have you ever been diagnosed with an anxiety problem in the past?" (e.g., generalized anxiety disorder, panic attacks, PTSD). If Yes, ask: "How was this problem treated?" (e.g., medicines, counseling, etc.)     Yes- yes 7. RISK OF HARM - SUICIDAL IDEATION: "Do you ever have thoughts of hurting or  killing yourself?" If Yes, ask:  "Do you have these feelings now?" "Do you have a plan on how you would do this?"     no 8. TREATMENT:  "What has been done so far to treat this anxiety?" (e.g., medicines, relaxation strategies). "What has helped?"     Making appts- trying to calm self down  9. TREATMENT - THERAPIST: "Do you have a counselor or therapist? Name?"     no 10. POTENTIAL TRIGGERS: "Do you drink caffeinated beverages (e.g., coffee, colas, teas), and how much daily?" "Do you drink alcohol or use any drugs?" "Have you started any new medicines recently?"       N/a 11. PATIENT SUPPORT: "Who is with you now?" "Who do you live with?" "Do you have family or friends who you can talk to?"        Son and family 69. OTHER SYMPTOMS: "Do you have any other symptoms?" (e.g., feeling depressed, trouble concentrating, trouble sleeping, trouble breathing, palpitations or fast heartbeat, chest pain, sweating, nausea, or diarrhea) Trouble focusing  Protocols used: Anxiety and Panic Attack-A-AH

## 2022-10-09 NOTE — Telephone Encounter (Signed)
Pt called back to let NT know she has appt with her GYN 12/07/22.

## 2022-10-11 ENCOUNTER — Ambulatory Visit
Admission: EM | Admit: 2022-10-11 | Discharge: 2022-10-11 | Disposition: A | Discharge status: Home / Self Care | Payer: 59

## 2022-10-11 ENCOUNTER — Emergency Department (HOSPITAL_COMMUNITY): Payer: 59

## 2022-10-11 ENCOUNTER — Encounter (HOSPITAL_COMMUNITY): Payer: Self-pay

## 2022-10-11 ENCOUNTER — Emergency Department (HOSPITAL_COMMUNITY)
Admission: EM | Admit: 2022-10-11 | Discharge: 2022-10-11 | Disposition: A | Discharge status: Home / Self Care | Payer: 59 | Attending: Emergency Medicine | Admitting: Emergency Medicine

## 2022-10-11 ENCOUNTER — Encounter: Payer: Self-pay | Admitting: Emergency Medicine

## 2022-10-11 ENCOUNTER — Other Ambulatory Visit: Payer: Self-pay

## 2022-10-11 DIAGNOSIS — K5901 Slow transit constipation: Secondary | ICD-10-CM | POA: Insufficient documentation

## 2022-10-11 DIAGNOSIS — Z711 Person with feared health complaint in whom no diagnosis is made: Secondary | ICD-10-CM | POA: Diagnosis not present

## 2022-10-11 DIAGNOSIS — K5900 Constipation, unspecified: Secondary | ICD-10-CM | POA: Diagnosis present

## 2022-10-11 NOTE — Discharge Instructions (Signed)
Follow-up with primary care doctor.  I also provided you with gastrointestinal doctor information if you would like to follow-up with them.  Please ensure you are drinking plenty of water and eating a high-fiber eating plan.  I have attached instructions for this.

## 2022-10-11 NOTE — ED Triage Notes (Signed)
Pt arrived from home via POV c/o constipation. Pt states that she has had bm's both today and yesterday, but feels like it is a struggle to get it out and that she is not getting it all out. Denies abd pain

## 2022-10-11 NOTE — ED Triage Notes (Signed)
Pt said she is not a big eater and is not using the bathroom but every 3 days.pt said it is soft and not hard. Pt said she has changed her diet.Pt also very emotional bc of cancer in her family.

## 2022-10-11 NOTE — ED Provider Notes (Signed)
[place] EMERGENCY DEPARTMENT AT [facility] Provider Note   CSN: [id number] Arrival date & time: 10/11/22  6644     History  Chief Complaint  Patient presents with   Constipation    Denesha Denk is a 32 y.o. female.  Patient is a 32 year old female with a history of anemia, scoliosis who is presenting today with concerns over constipation.  Patient reports that her mom recently was diagnosed with ovarian cancer and this is made her very worried.  She reports that she typically has a bowel movement about 3 times a week but she never feels like she completely empties her bowels.  She does not have abdominal pain, nausea or vomiting.  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  She does not take anything to make her go to the bathroom more but did juice on Friday Saturday and today and has had a bowel movement 2 days in a row but she felt like it was abnormal in color.  There is no blood in her stool.  She denies any urinary symptoms and has had normal menses.  The history is provided by the patient.  Constipation      Home Medications Prior to Admission medications   Medication Sig Start Date End Date Taking? Authorizing Provider  Iron, Ferrous Sulfate, 325 (65 Fe) MG TABS Take 325 mg by mouth 2 (two) times daily. 07/02/21   Georganna Skeans, MD      Allergies    Patient has no known allergies.    Review of Systems   Review of Systems  Gastrointestinal:  Positive for constipation.    Physical Exam Updated Vital Signs BP 119/71   Pulse 71   Temp 97.7 F (36.5 C) (Oral)   Resp 20   Ht 5\' 2"  (1.575 m)   Wt 53 kg   LMP 09/30/2022 (Exact Date)   SpO2 100%   BMI 21.37 kg/m  Physical Exam Vitals and nursing note reviewed.  Constitutional:      General: She is not in acute distress.    Appearance: She is well-developed.  HENT:     Head: Normocephalic and atraumatic.  Eyes:     Conjunctiva/sclera: Conjunctivae normal.     Pupils:  Pupils are equal, round, and reactive to light.  Cardiovascular:     Rate and Rhythm: Normal rate and regular rhythm.     Heart sounds: No murmur heard. Pulmonary:     Effort: Pulmonary effort is normal. No respiratory distress.     Breath sounds: Normal breath sounds. No wheezing or rales.  Abdominal:     General: There is no distension.     Palpations: Abdomen is soft. There is no mass.     Tenderness: There is no abdominal tenderness. There is no guarding or rebound.     Comments: No hepatosplenomegaly.  No pelvic masses palpated.  Musculoskeletal:        General: No tenderness. Normal range of motion.     Cervical back: Normal range of motion and neck supple.  Skin:    General: Skin is warm and dry.     Findings: No erythema or rash.  Neurological:     Mental Status: She is alert and oriented to person, place, and time.  Psychiatric:        Behavior: Behavior normal.     ED Results / Procedures / Treatments   Labs (all labs ordered are listed, but only abnormal results are displayed) Labs  Reviewed - No data to display  EKG None  Radiology DG Abdomen 1 View  Result Date: 10/11/2022 CLINICAL DATA:  Constipation EXAM: ABDOMEN - 1 VIEW COMPARISON:  None Available. FINDINGS: The bowel gas pattern is normal. Small stool burden. No radio-opaque calculi or other significant radiographic abnormality are seen. IMPRESSION: 1. Small stool burden. Electronically Signed   By: Helyn Numbers M.D.   On: 10/11/2022 22:04    Procedures Procedures    Medications Ordered in ED Medications - No data to display  ED Course/ Medical Decision Making/ A&P                             Medical Decision Making Amount and/or Complexity of Data Reviewed Radiology: ordered and independent interpretation performed. Decision-making details documented in ED Course.   Patient is here today because she is worried that she could have colon cancer.  She has had constipation for quite some time most  likely over a year but her mom was just diagnosed with ovarian cancer and it is making her very concerned.  She has had no weight loss, she is having bowel movements but usually only 3 times a week.  In talking with her this is most likely related to inactivity and diet.  On exam she has no masses noted or abdominal pain.  She has no urinary or pelvic symptoms.  Low suspicion for pregnancy as patient has had normal menses and reports at this time she is not sexually active.  She does not take any medication that would cause constipation. I have independently visualized and interpreted pt's images today. KUB without acute findings.  Pt given reassurance and f/u with PCP.        Final Clinical Impression(s) / ED Diagnoses Final diagnoses:  Slow transit constipation    Rx / DC Orders ED Discharge Orders     None         Gwyneth Sprout, MD 10/11/22 2244

## 2022-10-11 NOTE — ED Provider Notes (Signed)
[name]    CSN: [id number] Arrival date & time: 10/11/22  0813      History   Chief Complaint Chief Complaint  Patient presents with   Constipation    HPI Michelle Velez is a 32 y.o. female.   Patient presents today due to concerns of constipation.  Patient states that for multiple years she has been having bowel movements 3 times a week.  She states that she read online that she is supposed to be having daily bowel movements and is concerned that she may have cancer due to this.  She states that her mother was recently diagnosed with ovarian cancer so now she is concerned that her bowel movements are related to some form of cancer.  She denies any blood in her stool, nausea, vomiting, abdominal pain, fever.  Reports that she is still passing flatulence as well.  Stools are soft.  States that she is "not a big eater" and has been "juicing" recently as well.   Constipation   Past Medical History:  Diagnosis Date   Anemia    Gestational thrombocytopenia (HCC)    Gestational thrombocytopenia without hemorrhage in third trimester (HCC) 08/22/2019   Likely gestational though review of Care Everywhere records shows has intermittently had during non-pregnant times as well Most recent checks 129>115>91>133 on 08/22/2019 Nadir of 74 during admission for last delivery Recheck at 36-37 wks, consider heme consult if again dips below 100k   History of cesarean delivery 04/13/2019   VBAC consent signed 08/08/2019 Op note in Care Everywhere 03/15/2015   Inguinal hernia 2019   Mild preeclampsia 11/06/2019   Postoperative ileus (HCC) 11/08/2019   Scoliosis 2006    Patient Active Problem List   Diagnosis Date Noted   Vitamin D deficiency 09/17/2017   Unilateral inguinal hernia 06/16/2017   Eczema 05/19/2017   Gynecologic exam normal 05/19/2017   Iron deficiency anemia 05/19/2017   Routine history and physical examination of adult 05/19/2017   Normal pregnancy 03/14/2015     Past Surgical History:  Procedure Laterality Date   BREAST BIOPSY Left 08/29/2018   CESAREAN SECTION     CESAREAN SECTION  11/06/2019   Procedure: CESAREAN SECTION;  Surgeon: Warden Fillers, MD;  Location: [facility];  Service: Obstetrics;;   DILATION AND CURETTAGE OF UTERUS      OB History     Gravida  4   Para  2   Term  2   Preterm      AB  1   Living  2      SAB  1   IAB      Ectopic      Multiple  0   Live Births  2            Home Medications    Prior to Admission medications   Medication Sig Start Date End Date Taking? Authorizing Provider  Iron, Ferrous Sulfate, 325 (65 Fe) MG TABS Take 325 mg by mouth 2 (two) times daily. 07/02/21   Georganna Skeans, MD    Family History Family History  Problem Relation Age of Onset   Arthritis Mother    Diabetes Mother    Asthma Mother    Other Father        unknown   Asthma Brother    Diabetes Maternal Grandmother     Social History Social History   Tobacco Use   Smoking status: Never   Smokeless tobacco: Never  Vaping Use  Vaping Use: Never used  Substance Use Topics   Alcohol use: Never   Drug use: Never     Allergies   Patient has no known allergies.   Review of Systems Review of Systems Per HPI  Physical Exam Triage Vital Signs ED Triage Vitals  Enc Vitals Group     BP 10/11/22 0900 112/77     Pulse Rate 10/11/22 0900 80     Resp 10/11/22 0900 16     Temp 10/11/22 0900 98.2 F (36.8 C)     Temp Source 10/11/22 0900 Oral     SpO2 10/11/22 0900 99 %     Weight --      Height --      Head Circumference --      Peak Flow --      Pain Score 10/11/22 0859 0     Pain Loc --      Pain Edu? --      Excl. in GC? --    No data found.  Updated Vital Signs BP 112/77 (BP Location: Left Arm)   Pulse 80   Temp 98.2 F (36.8 C) (Oral)   Resp 16   LMP 09/30/2022 (Exact Date)   SpO2 99%   Visual Acuity Right Eye Distance:   Left Eye Distance:   Bilateral Distance:     Right Eye Near:   Left Eye Near:    Bilateral Near:     Physical Exam Constitutional:      General: She is not in acute distress.    Appearance: Normal appearance. She is not toxic-appearing or diaphoretic.  HENT:     Head: Normocephalic and atraumatic.  Eyes:     Extraocular Movements: Extraocular movements intact.     Conjunctiva/sclera: Conjunctivae normal.  Cardiovascular:     Rate and Rhythm: Normal rate and regular rhythm.     Pulses: Normal pulses.     Heart sounds: Normal heart sounds.  Pulmonary:     Effort: Pulmonary effort is normal. No respiratory distress.     Breath sounds: Normal breath sounds.  Abdominal:     General: Bowel sounds are normal. There is no distension.     Palpations: Abdomen is soft.     Tenderness: There is no abdominal tenderness.  Neurological:     General: No focal deficit present.     Mental Status: She is alert and oriented to person, place, and time. Mental status is at baseline.  Psychiatric:        Mood and Affect: Mood normal.        Behavior: Behavior normal.        Thought Content: Thought content normal.        Judgment: Judgment normal.      UC Treatments / Results  Labs (all labs ordered are listed, but only abnormal results are displayed) Labs Reviewed - No data to display  EKG   Radiology No results found.  Procedures Procedures (including critical care time)  Medications Ordered in UC Medications - No data to display  Initial Impression / Assessment and Plan / UC Course  I have reviewed the triage vital signs and the nursing notes.  Pertinent labs & imaging results that were available during my care of the patient were reviewed by me and considered in my medical decision making (see chart for details).     I am not concerned the patient has any form constipation, ileus, bowel obstruction given that she has been having bowel movements  3 times a week for multiple years so this is most likely patient's  regular schedule.  Patient is still passing flatulence and abdominal exam is benign so do not think that emergent evaluation or imaging is necessary.  Educated patient that this is most likely her normal and to ensure that she is drinking plenty of fluids and eating a high fiber diet to ensure that she stays regular.  She is very concerned that she has some form of cancer so encouraged her to follow-up with PCP to discuss this.  Also provided patient with contact information for GI specialty if she would like to follow-up with them for any additional concerns.  Patient was given strict return and ER precautions.  Patient verbalized understanding and was agreeable with plan. Final Clinical Impressions(s) / UC Diagnoses   Final diagnoses:  Physically well but worried     Discharge Instructions      Follow-up with primary care doctor.  I also provided you with gastrointestinal doctor information if you would like to follow-up with them.  Please ensure you are drinking plenty of water and eating a high-fiber eating plan.  I have attached instructions for this.    ED Prescriptions   None    PDMP not reviewed this encounter.   Gustavus Bryant, [place] 10/11/22 [phone]

## 2022-10-11 NOTE — Discharge Instructions (Addendum)
No abnormalities on x-ray.  Continue with your routine women's health with Pap smears.  If increasing your fiber with Benefiber or Metamucil and changing your diet and increasing activity do not improve your constipation follow-up with your regular doctor to see if you need colon screening.

## 2022-10-12 ENCOUNTER — Ambulatory Visit: Payer: 59 | Admitting: Family Medicine

## 2022-10-12 ENCOUNTER — Encounter: Payer: Self-pay | Admitting: Gastroenterology

## 2022-12-07 ENCOUNTER — Ambulatory Visit: Payer: 59 | Admitting: Family Medicine

## 2022-12-07 ENCOUNTER — Ambulatory Visit: Payer: 59 | Admitting: Obstetrics and Gynecology

## 2022-12-14 ENCOUNTER — Ambulatory Visit
Admission: EM | Admit: 2022-12-14 | Discharge: 2022-12-14 | Disposition: A | Discharge status: Home / Self Care | Payer: Commercial Managed Care - PPO | Attending: Internal Medicine | Admitting: Internal Medicine

## 2022-12-14 DIAGNOSIS — N898 Other specified noninflammatory disorders of vagina: Secondary | ICD-10-CM | POA: Insufficient documentation

## 2022-12-14 DIAGNOSIS — Z113 Encounter for screening for infections with a predominantly sexual mode of transmission: Secondary | ICD-10-CM | POA: Diagnosis present

## 2022-12-14 DIAGNOSIS — R300 Dysuria: Secondary | ICD-10-CM | POA: Diagnosis present

## 2022-12-14 LAB — POCT URINALYSIS DIP (MANUAL ENTRY)
Bilirubin, UA: NEGATIVE
Blood, UA: NEGATIVE
Glucose, UA: NEGATIVE mg/dL
Ketones, POC UA: NEGATIVE mg/dL
Leukocytes, UA: NEGATIVE
Nitrite, UA: NEGATIVE
Protein Ur, POC: NEGATIVE mg/dL
Spec Grav, UA: 1.01 (ref 1.010–1.025)
Urobilinogen, UA: 0.2 U/dL
pH, UA: 5.5 (ref 5.0–8.0)

## 2022-12-14 LAB — POCT URINE PREGNANCY: Preg Test, Ur: NEGATIVE

## 2022-12-14 NOTE — Discharge Instructions (Signed)
Urine was clear.  Vaginal swab and urine culture pending.  Will call with the results.  If symptoms persist or worsen, please follow-up with your gynecologist.

## 2022-12-14 NOTE — ED Provider Notes (Signed)
[name]    CSN: [id number] Arrival date & time: 12/14/22  7253      History   Chief Complaint Chief Complaint  Patient presents with   Dysuria    HPI Michelle Velez is a 32 y.o. female.   Patient presents today with urinary burning and right flank pain that started over the past few days.  Denies urinary frequency, hematuria, abdominal pain, fever.  She does report that she has had some bodyaches.  Reports that she has clear vaginal discharge but no new vaginal discharge.  Last menstrual cycle was 09/30/2022.  Patient reports that she recently had a miscarriage recently.  She was evaluated by OB/GYN after miscarriage and states that she was told that "everything was clear".  Has not had a menstrual cycle since the miscarriage occurred.  She has had unprotected sexual intercourse but denies exposure to STD.   Dysuria   Past Medical History:  Diagnosis Date   Anemia    Gestational thrombocytopenia (HCC)    Gestational thrombocytopenia without hemorrhage in third trimester (HCC) 08/22/2019   Likely gestational though review of Care Everywhere records shows has intermittently had during non-pregnant times as well Most recent checks 129>115>91>133 on 08/22/2019 Nadir of 74 during admission for last delivery Recheck at 36-37 wks, consider heme consult if again dips below 100k   History of cesarean delivery 04/13/2019   VBAC consent signed 08/08/2019 Op note in Care Everywhere 03/15/2015   Inguinal hernia 2019   Mild preeclampsia 11/06/2019   Postoperative ileus (HCC) 11/08/2019   Scoliosis 2006    Patient Active Problem List   Diagnosis Date Noted   Vitamin D deficiency 09/17/2017   Unilateral inguinal hernia 06/16/2017   Eczema 05/19/2017   Gynecologic exam normal 05/19/2017   Iron deficiency anemia 05/19/2017   Routine history and physical examination of adult 05/19/2017   Normal pregnancy 03/14/2015    Past Surgical History:  Procedure Laterality Date   BREAST  BIOPSY Left 08/29/2018   CESAREAN SECTION     CESAREAN SECTION  11/06/2019   Procedure: CESAREAN SECTION;  Surgeon: Warden Fillers, MD;  Location: [facility];  Service: Obstetrics;;   DILATION AND CURETTAGE OF UTERUS      OB History     Gravida  4   Para  2   Term  2   Preterm      AB  1   Living  2      SAB  1   IAB      Ectopic      Multiple  0   Live Births  2            Home Medications    Prior to Admission medications   Medication Sig Start Date End Date Taking? Authorizing Provider  Iron, Ferrous Sulfate, 325 (65 Fe) MG TABS Take 325 mg by mouth 2 (two) times daily. 07/02/21   Georganna Skeans, MD    Family History Family History  Problem Relation Age of Onset   Arthritis Mother    Diabetes Mother    Asthma Mother    Other Father        unknown   Asthma Brother    Diabetes Maternal Grandmother     Social History Social History   Tobacco Use   Smoking status: Never   Smokeless tobacco: Never  Vaping Use   Vaping status: Never Used  Substance Use Topics   Alcohol use: Never   Drug use: Never  Allergies   Patient has no known allergies.   Review of Systems Review of Systems Per HPI  Physical Exam Triage Vital Signs ED Triage Vitals  Encounter Vitals Group     BP 12/14/22 0950 120/77     Systolic BP Percentile --      Diastolic BP Percentile --      Pulse Rate 12/14/22 0950 65     Resp 12/14/22 0950 16     Temp 12/14/22 0950 98 F (36.7 C)     Temp Source 12/14/22 0950 Oral     SpO2 12/14/22 0950 98 %     Weight --      Height --      Head Circumference --      Peak Flow --      Pain Score 12/14/22 0951 0     Pain Loc --      Pain Education --      Exclude from Growth Chart --    No data found.  Updated Vital Signs BP 120/77 (BP Location: Left Arm)   Pulse 65   Temp 98 F (36.7 C) (Oral)   Resp 16   LMP 09/30/2022 (Approximate) Comment: pt states she had a miscarriage in August 2024  SpO2 98%   Visual  Acuity Right Eye Distance:   Left Eye Distance:   Bilateral Distance:    Right Eye Near:   Left Eye Near:    Bilateral Near:     Physical Exam Constitutional:      General: She is not in acute distress.    Appearance: Normal appearance. She is not toxic-appearing or diaphoretic.  HENT:     Head: Normocephalic and atraumatic.  Eyes:     Extraocular Movements: Extraocular movements intact.     Conjunctiva/sclera: Conjunctivae normal.  Pulmonary:     Effort: Pulmonary effort is normal.  Abdominal:     General: Bowel sounds are normal. There is no distension.     Palpations: Abdomen is soft.     Tenderness: There is no abdominal tenderness. There is no right CVA tenderness or left CVA tenderness.  Neurological:     General: No focal deficit present.     Mental Status: She is alert and oriented to person, place, and time. Mental status is at baseline.  Psychiatric:        Mood and Affect: Mood normal.        Behavior: Behavior normal.        Thought Content: Thought content normal.        Judgment: Judgment normal.      UC Treatments / Results  Labs (all labs ordered are listed, but only abnormal results are displayed) Labs Reviewed  URINE CULTURE  POCT URINALYSIS DIP (MANUAL ENTRY)  POCT URINE PREGNANCY  CERVICOVAGINAL ANCILLARY ONLY    EKG   Radiology No results found.  Procedures Procedures (including critical care time)  Medications Ordered in UC Medications - No data to display  Initial Impression / Assessment and Plan / UC Course  I have reviewed the triage vital signs and the nursing notes.  Pertinent labs & imaging results that were available during my care of the patient were reviewed by me and considered in my medical decision making (see chart for details).     UA completely unremarkable.  Urine pregnancy test negative.  Will send cervicovaginal swab to ensure that symptoms are not related to vaginitis.  Will send urine culture despite UA being  negative given patient  is having  typical UTI symptoms.  No concern for kidney stone on physical exam.  Encouraged strict return and ER precautions and following up with OB/GYN if symptoms persist or worsen.  Patient verbalized understanding and was agreeable with plan. Final Clinical Impressions(s) / UC Diagnoses   Final diagnoses:  Dysuria  Vaginal discharge  Screening examination for venereal disease     Discharge Instructions      Urine was clear.  Vaginal swab and urine culture pending.  Will call with the results.  If symptoms persist or worsen, please follow-up with your gynecologist.    ED Prescriptions   None    PDMP not reviewed this encounter.   Gustavus Bryant, [place] 12/14/22 1032

## 2022-12-14 NOTE — ED Triage Notes (Signed)
Pt states burning with urination and low back pain for the past week.

## 2022-12-15 LAB — URINE CULTURE: Culture: NO GROWTH

## 2022-12-17 LAB — CERVICOVAGINAL ANCILLARY ONLY
Bacterial Vaginitis (gardnerella): NEGATIVE
Candida Glabrata: NEGATIVE
Candida Vaginitis: NEGATIVE
Chlamydia: NEGATIVE
Comment: NEGATIVE
Comment: NEGATIVE
Comment: NEGATIVE
Comment: NEGATIVE
Comment: NEGATIVE
Comment: NORMAL
Neisseria Gonorrhea: NEGATIVE
Trichomonas: NEGATIVE

## 2022-12-23 ENCOUNTER — Other Ambulatory Visit (HOSPITAL_COMMUNITY)
Admission: RE | Admit: 2022-12-23 | Discharge: 2022-12-23 | Disposition: A | Discharge status: Home / Self Care | Payer: 59 | Source: Ambulatory Visit | Attending: Family Medicine | Admitting: Family Medicine

## 2022-12-23 ENCOUNTER — Ambulatory Visit: Payer: 59 | Admitting: Physician Assistant

## 2022-12-23 ENCOUNTER — Encounter: Payer: Self-pay | Admitting: Family Medicine

## 2022-12-23 ENCOUNTER — Ambulatory Visit (INDEPENDENT_AMBULATORY_CARE_PROVIDER_SITE_OTHER): Payer: 59 | Admitting: Family Medicine

## 2022-12-23 VITALS — BP 112/74 | HR 81 | Temp 98.7°F | Resp 16 | Ht 62.0 in | Wt 118.0 lb

## 2022-12-23 DIAGNOSIS — Z13220 Encounter for screening for lipoid disorders: Secondary | ICD-10-CM

## 2022-12-23 DIAGNOSIS — Z01419 Encounter for gynecological examination (general) (routine) without abnormal findings: Secondary | ICD-10-CM

## 2022-12-23 DIAGNOSIS — Z0000 Encounter for general adult medical examination without abnormal findings: Secondary | ICD-10-CM

## 2022-12-23 DIAGNOSIS — Z130 Encounter for screening for diseases of the blood and blood-forming organs and certain disorders involving the immune mechanism: Secondary | ICD-10-CM

## 2022-12-23 DIAGNOSIS — Z23 Encounter for immunization: Secondary | ICD-10-CM | POA: Diagnosis not present

## 2022-12-23 NOTE — Progress Notes (Unsigned)
Annual Exam, Gynecologic Exam

## 2022-12-24 ENCOUNTER — Encounter: Payer: Self-pay | Admitting: Family Medicine

## 2022-12-24 LAB — CBC WITH DIFFERENTIAL/PLATELET
Basophils Absolute: 0.1 10*3/uL (ref 0.0–0.2)
Basos: 1 %
EOS (ABSOLUTE): 0.1 10*3/uL (ref 0.0–0.4)
Eos: 2 %
Hematocrit: 30.8 % — ABNORMAL LOW (ref 34.0–46.6)
Hemoglobin: 9.1 g/dL — ABNORMAL LOW (ref 11.1–15.9)
Immature Grans (Abs): 0 10*3/uL (ref 0.0–0.1)
Immature Granulocytes: 0 %
Lymphocytes Absolute: 1.9 10*3/uL (ref 0.7–3.1)
Lymphs: 30 %
MCH: 22.2 pg — ABNORMAL LOW (ref 26.6–33.0)
MCHC: 29.5 g/dL — ABNORMAL LOW (ref 31.5–35.7)
MCV: 75 fL — ABNORMAL LOW (ref 79–97)
Monocytes Absolute: 0.7 10*3/uL (ref 0.1–0.9)
Monocytes: 11 %
Neutrophils Absolute: 3.6 10*3/uL (ref 1.4–7.0)
Neutrophils: 56 %
Platelets: 122 10*3/uL — ABNORMAL LOW (ref 150–450)
RBC: 4.09 x10E6/uL (ref 3.77–5.28)
RDW: 15.1 % (ref 11.7–15.4)
WBC: 6.3 10*3/uL (ref 3.4–10.8)

## 2022-12-24 LAB — LIPID PANEL
Chol/HDL Ratio: 1.7 ratio (ref 0.0–4.4)
Cholesterol, Total: 138 mg/dL (ref 100–199)
HDL: 81 mg/dL (ref >39)
LDL Chol Calc (NIH): 47 mg/dL (ref 0–99)
Triglycerides: 38 mg/dL (ref 0–149)
VLDL Cholesterol Cal: 10 mg/dL (ref 5–40)

## 2022-12-24 LAB — COMPREHENSIVE METABOLIC PANEL
ALT: 12 IU/L (ref 0–32)
AST: 20 IU/L (ref 0–40)
Albumin: 4.3 g/dL (ref 3.9–4.9)
Alkaline Phosphatase: 87 IU/L (ref 44–121)
BUN/Creatinine Ratio: 14 (ref 9–23)
BUN: 9 mg/dL (ref 6–20)
Bilirubin Total: 0.2 mg/dL (ref 0.0–1.2)
CO2: 22 mmol/L (ref 20–29)
Calcium: 9.4 mg/dL (ref 8.7–10.2)
Chloride: 102 mmol/L (ref 96–106)
Creatinine, Ser: 0.65 mg/dL (ref 0.57–1.00)
Globulin, Total: 2.9 g/dL (ref 1.5–4.5)
Glucose: 85 mg/dL (ref 70–99)
Potassium: 4.2 mmol/L (ref 3.5–5.2)
Sodium: 138 mmol/L (ref 134–144)
Total Protein: 7.2 g/dL (ref 6.0–8.5)
eGFR: 119 mL/min/{1.73_m2} (ref >59)

## 2022-12-24 LAB — CERVICOVAGINAL ANCILLARY ONLY
Bacterial Vaginitis (gardnerella): NEGATIVE
Candida Glabrata: NEGATIVE
Candida Vaginitis: NEGATIVE
Chlamydia: NEGATIVE
Comment: NEGATIVE
Comment: NEGATIVE
Comment: NEGATIVE
Comment: NEGATIVE
Comment: NEGATIVE
Comment: NORMAL
Neisseria Gonorrhea: NEGATIVE
Trichomonas: NEGATIVE

## 2022-12-24 NOTE — Progress Notes (Signed)
Established Patient Office Visit  Subjective    Patient ID: Michelle Velez, female    DOB: 08/17/1990  Age: 32 y.o. MRN: [id number]  CC:  Chief Complaint  Patient presents with   Gynecologic Exam    Pap smear   Annual Exam    HPI Michelle Velez presents for routine annual exam. Patient denies acute complaints or concerns.   Outpatient Encounter Medications as of 12/23/2022  Medication Sig   Iron, Ferrous Sulfate, 325 (65 Fe) MG TABS Take 325 mg by mouth 2 (two) times daily.   No facility-administered encounter medications on file as of 12/23/2022.    Past Medical History:  Diagnosis Date   Anemia    Gestational thrombocytopenia (HCC)    Gestational thrombocytopenia without hemorrhage in third trimester (HCC) 08/22/2019   Likely gestational though review of Care Everywhere records shows has intermittently had during non-pregnant times as well Most recent checks 129>115>91>133 on 08/22/2019 Nadir of 74 during admission for last delivery Recheck at 36-37 wks, consider heme consult if again dips below 100k   History of cesarean delivery 04/13/2019   VBAC consent signed 08/08/2019 Op note in Care Everywhere 03/15/2015   Inguinal hernia 2019   Mild preeclampsia 11/06/2019   Postoperative ileus (HCC) 11/08/2019   Scoliosis 2006    Past Surgical History:  Procedure Laterality Date   BREAST BIOPSY Left 08/29/2018   CESAREAN SECTION     CESAREAN SECTION  11/06/2019   Procedure: CESAREAN SECTION;  Surgeon: Warden Fillers, MD;  Location: [facility];  Service: Obstetrics;;   DILATION AND CURETTAGE OF UTERUS      Family History  Problem Relation Age of Onset   Arthritis Mother    Diabetes Mother    Asthma Mother    Other Father        unknown   Asthma Brother    Diabetes Maternal Grandmother     Social History   Socioeconomic History   Marital status: Single    Spouse name: Not on file   Number of children: Not on file   Years of education: Not on file   Highest education  level: Not on file  Occupational History   Not on file  Tobacco Use   Smoking status: Never   Smokeless tobacco: Never  Vaping Use   Vaping status: Never Used  Substance and Sexual Activity   Alcohol use: Never   Drug use: Never   Sexual activity: Not Currently    Birth control/protection: None  Other Topics Concern   Not on file  Social History Narrative   Not on file   Social Determinants of Health   Financial Resource Strain: Low Risk  (12/23/2022)   Overall Financial Resource Strain (CARDIA)    Difficulty of Paying Living Expenses: Not hard at all  Food Insecurity: No Food Insecurity (12/23/2022)   Hunger Vital Sign    Worried About Running Out of Food in the Last Year: Never true    Ran Out of Food in the Last Year: Never true  Transportation Needs: No Transportation Needs (12/23/2022)   PRAPARE - Administrator, Civil Service (Medical): No    Lack of Transportation (Non-Medical): No  Physical Activity: Insufficiently Active (12/23/2022)   Exercise Vital Sign    Days of Exercise per Week: 3 days    Minutes of Exercise per Session: 30 min  Stress: Stress Concern Present (12/23/2022)   [employer] of Occupational Health - Occupational Stress Questionnaire  Feeling of Stress : Rather much  Social Connections: Socially Isolated (12/23/2022)   Social Connection and Isolation Panel [NHANES]    Frequency of Communication with Friends and Family: Twice a week    Frequency of Social Gatherings with Friends and Family: Never    Attends Religious Services: Never    Database administrator or Organizations: No    Attends Banker Meetings: Never    Marital Status: Divorced  Catering manager Violence: Not At Risk (12/23/2022)   Humiliation, Afraid, Rape, and Kick questionnaire    Fear of Current or Ex-Partner: No    Emotionally Abused: No    Physically Abused: No    Sexually Abused: No    Review of Systems  All other systems reviewed and are  negative.       Objective    BP 112/74 (BP Location: Right Arm, Patient Position: Sitting, Cuff Size: Normal)   Pulse 81   Temp 98.7 F (37.1 C) (Oral)   Resp 16   Ht 5\' 2"  (1.575 m)   Wt 118 lb (53.5 kg)   LMP 09/30/2022 (Approximate) Comment: pt states she had a miscarriage in August 2024  SpO2 98%   BMI 21.58 kg/m   Physical Exam Vitals and nursing note reviewed.  Constitutional:      General: She is not in acute distress. HENT:     Head: Normocephalic and atraumatic.     Right Ear: Tympanic membrane, ear canal and external ear normal.     Left Ear: Tympanic membrane, ear canal and external ear normal.     Nose: Nose normal.     Mouth/Throat:     Mouth: Mucous membranes are moist.     Pharynx: Oropharynx is clear.  Eyes:     Conjunctiva/sclera: Conjunctivae normal.     Pupils: Pupils are equal, round, and reactive to light.  Neck:     Thyroid: No thyromegaly.  Cardiovascular:     Rate and Rhythm: Normal rate and regular rhythm.     Heart sounds: Normal heart sounds. No murmur heard. Pulmonary:     Effort: Pulmonary effort is normal. No respiratory distress.     Breath sounds: Normal breath sounds.  Abdominal:     General: There is no distension.     Palpations: Abdomen is soft. There is no mass.     Tenderness: There is no abdominal tenderness.     Hernia: There is no hernia in the left inguinal area.  Genitourinary:    Exam position: Supine.     Labia:        Right: No lesion.        Left: No lesion.      Vagina: Normal.     Cervix: Normal.     Uterus: Normal.      Adnexa: Right adnexa normal.     Rectum: Normal.  Musculoskeletal:        General: Normal range of motion.     Cervical back: Normal range of motion and neck supple.  Skin:    General: Skin is warm and dry.  Neurological:     General: No focal deficit present.     Mental Status: She is alert and oriented to person, place, and time.  Psychiatric:        Mood and Affect: Mood normal.         Behavior: Behavior normal.         Assessment & Plan:   Annual physical exam -  Comprehensive metabolic panel  Pap smear, as part of routine gynecological examination -     Cytology - PAP -     Cervicovaginal ancillary only  Screening for deficiency anemia -     CBC with Differential/Platelet  Screening for lipid disorders -     Lipid panel  Need for vaccination -     Flu vaccine trivalent PF, 6mos and older(Flulaval,Afluria,Fluarix,Fluzone)     No follow-ups on file.   Tommie Raymond, MD

## 2022-12-25 LAB — CYTOLOGY - PAP
Chlamydia: NEGATIVE
Comment: NEGATIVE
Comment: NEGATIVE
Comment: NORMAL
Diagnosis: NEGATIVE
Neisseria Gonorrhea: NEGATIVE
Trichomonas: NEGATIVE

## 2023-03-22 ENCOUNTER — Ambulatory Visit: Payer: Self-pay

## 2023-03-22 NOTE — Telephone Encounter (Signed)
Chief Complaint: Abdominal Pain Symptoms: Abdominal pain 4/10, burning with urination,  Frequency: comes and goes Pertinent Negatives: Patient denies fever, foul odor, nausea, vomiting Disposition: [] ED /[] Urgent Care (no appt availability in office) / [x] Appointment(In office/virtual)/ []  [facility]/ [] Home Care/ [] Refused Recommended Disposition /[] [facility]/ []  Follow-up with PCP Additional Notes: Patient stated she has been having abdominal pain between her c-section scar and the naval. Patient stated that the pain and burning comes and goes  and it feels better when she drinks enough fluids. Patient states she has a history of Fibroids as well. Care advice given and patient was offered 2 appointments this week but declined due to work schedule. Patient has been scheduled 03/30/23.  Reason for Disposition  [1] MILD pain (e.g., does not interfere with normal activities) AND [2] pain comes and goes (cramps) AND [3] present > 48 hours  (Exception: This same abdominal pain is a chronic symptom recurrent or ongoing AND present > 4 weeks.)  Answer Assessment - Initial Assessment Questions 1. LOCATION: "Where does it hurt?"      Between the c-section scar and belly botton  2. RADIATION: "Does the pain shoot anywhere else?" (e.g., chest, back)     No 3. ONSET: "When did the pain begin?" (e.g., minutes, hours or days ago)      03/11/23 4. SUDDEN: "Gradual or sudden onset?"     Gradual 5. PATTERN "Does the pain come and go, or is it constant?"    - If it comes and goes: "How long does it last?" "Do you have pain now?"     (Note: Comes and goes means the pain is intermittent. It goes away completely between bouts.)    - If constant: "Is it getting better, staying the same, or getting worse?"      (Note: Constant means the pain never goes away completely; most serious pain is constant and gets worse.)      Comes and goes  6. SEVERITY: "How bad is the pain?"  (e.g., Scale  1-10; mild, moderate, or severe)    - MILD (1-3): Doesn't interfere with normal activities, abdomen soft and not tender to touch.     - MODERATE (4-7): Interferes with normal activities or awakens from sleep, abdomen tender to touch.     - SEVERE (8-10): Excruciating pain, doubled over, unable to do any normal activities.       4/10 7. RECURRENT SYMPTOM: "Have you ever had this type of stomach pain before?" If Yes, ask: "When was the last time?" and "What happened that time?"      No 8. CAUSE: "What do you think is causing the stomach pain?"     I'm not sure  9. RELIEVING/AGGRAVATING FACTORS: "What makes it better or worse?" (e.g., antacids, bending or twisting motion, bowel movement)     No 10. OTHER SYMPTOMS: "Do you have any other symptoms?" (e.g., back pain, diarrhea, fever, urination pain, vomiting)       Burning with urination  Protocols used: Abdominal Pain - Female-A-AH

## 2023-03-30 ENCOUNTER — Other Ambulatory Visit (HOSPITAL_COMMUNITY)
Admission: RE | Admit: 2023-03-30 | Discharge: 2023-03-30 | Disposition: A | Discharge status: Home / Self Care | Payer: Commercial Managed Care - PPO | Source: Ambulatory Visit | Attending: Family | Admitting: Family

## 2023-03-30 ENCOUNTER — Ambulatory Visit (INDEPENDENT_AMBULATORY_CARE_PROVIDER_SITE_OTHER): Payer: Commercial Managed Care - PPO | Admitting: Family

## 2023-03-30 VITALS — BP 105/69 | HR 78 | Temp 98.1°F | Ht 62.0 in | Wt 118.8 lb

## 2023-03-30 DIAGNOSIS — R300 Dysuria: Secondary | ICD-10-CM

## 2023-03-30 LAB — POCT URINALYSIS DIP (CLINITEK)
Bilirubin, UA: NEGATIVE
Blood, UA: NEGATIVE
Glucose, UA: NEGATIVE mg/dL
Ketones, POC UA: NEGATIVE mg/dL
Leukocytes, UA: NEGATIVE
Nitrite, UA: NEGATIVE
POC PROTEIN,UA: NEGATIVE
Spec Grav, UA: >=1.03 — AB (ref 1.010–1.025)
Urobilinogen, UA: 0.2 U/dL
pH, UA: 6.5 (ref 5.0–8.0)

## 2023-03-30 NOTE — Progress Notes (Signed)
Patient ID: Michelle Velez, female    DOB: February 25, 1991  MRN: [id number]  CC: Urinary Symptoms  Subjective: Michelle Velez is a 32 y.o. female who presents for urinary symptoms.  Her concerns today include:  States burning with urination began around Thanksgiving and continuing since then. Denies red flag symptoms. Reports thinks may be related to C-section from 3 years ago. Also, history of fibroid. Reports she has upcoming appointment with Gynecology. Reports she is trying to drink more water. No further issues/concerns for discussion today.  Patient Active Problem List   Diagnosis Date Noted   Vitamin D deficiency 09/17/2017   Unilateral inguinal hernia 06/16/2017   Eczema 05/19/2017   Gynecologic exam normal 05/19/2017   Iron deficiency anemia 05/19/2017   Routine history and physical examination of adult 05/19/2017   Normal pregnancy 03/14/2015     Current Outpatient Medications on File Prior to Visit  Medication Sig Dispense Refill   Iron, Ferrous Sulfate, 325 (65 Fe) MG TABS Take 325 mg by mouth 2 (two) times daily. (Patient not taking: Reported on 03/30/2023) 60 tablet 3   No current facility-administered medications on file prior to visit.    No Known Allergies  Social History   Socioeconomic History   Marital status: Single    Spouse name: Not on file   Number of children: Not on file   Years of education: Not on file   Highest education level: Not on file  Occupational History   Not on file  Tobacco Use   Smoking status: Never   Smokeless tobacco: Never  Vaping Use   Vaping status: Never Used  Substance and Sexual Activity   Alcohol use: Never   Drug use: Never   Sexual activity: Not Currently    Birth control/protection: None  Other Topics Concern   Not on file  Social History Narrative   Not on file   Social Drivers of Health   Financial Resource Strain: Low Risk  (12/23/2022)   Overall Financial Resource Strain (CARDIA)    Difficulty of Paying  Living Expenses: Not hard at all  Food Insecurity: No Food Insecurity (12/23/2022)   Hunger Vital Sign    Worried About Running Out of Food in the Last Year: Never true    Ran Out of Food in the Last Year: Never true  Transportation Needs: No Transportation Needs (12/23/2022)   PRAPARE - Administrator, Civil Service (Medical): No    Lack of Transportation (Non-Medical): No  Physical Activity: Insufficiently Active (12/23/2022)   Exercise Vital Sign    Days of Exercise per Week: 3 days    Minutes of Exercise per Session: 30 min  Stress: Stress Concern Present (12/23/2022)   [employer] of Occupational Health - Occupational Stress Questionnaire    Feeling of Stress : Rather much  Social Connections: Socially Isolated (12/23/2022)   Social Connection and Isolation Panel [NHANES]    Frequency of Communication with Friends and Family: Twice a week    Frequency of Social Gatherings with Friends and Family: Never    Attends Religious Services: Never    Database administrator or Organizations: No    Attends Banker Meetings: Never    Marital Status: Divorced  Catering manager Violence: Not At Risk (12/23/2022)   Humiliation, Afraid, Rape, and Kick questionnaire    Fear of Current or Ex-Partner: No    Emotionally Abused: No    Physically Abused: No    Sexually Abused: No  Family History  Problem Relation Age of Onset   Arthritis Mother    Diabetes Mother    Asthma Mother    Other Father        unknown   Asthma Brother    Diabetes Maternal Grandmother     Past Surgical History:  Procedure Laterality Date   BREAST BIOPSY Left 08/29/2018   CESAREAN SECTION     CESAREAN SECTION  11/06/2019   Procedure: CESAREAN SECTION;  Surgeon: Warden Fillers, MD;  Location: [facility];  Service: Obstetrics;;   DILATION AND CURETTAGE OF UTERUS      ROS: Review of Systems Negative except as stated above  PHYSICAL EXAM: BP 105/69   Pulse 78   Temp 98.1 F  (36.7 C) (Oral)   Ht 5\' 2"  (1.575 m)   Wt 118 lb 12.8 oz (53.9 kg)   LMP 03/07/2023 (Approximate)   SpO2 98%   BMI 21.73 kg/m   Physical Exam HENT:     Head: Normocephalic and atraumatic.     Nose: Nose normal.     Mouth/Throat:     Mouth: Mucous membranes are moist.     Pharynx: Oropharynx is clear.  Eyes:     Extraocular Movements: Extraocular movements intact.     Conjunctiva/sclera: Conjunctivae normal.     Pupils: Pupils are equal, round, and reactive to light.  Cardiovascular:     Rate and Rhythm: Normal rate and regular rhythm.     Pulses: Normal pulses.     Heart sounds: Normal heart sounds.  Pulmonary:     Effort: Pulmonary effort is normal.     Breath sounds: Normal breath sounds.  Abdominal:     General: Bowel sounds are normal.     Palpations: Abdomen is soft.     Comments: C-section site clean/dry/intact.   Musculoskeletal:        General: Normal range of motion.     Cervical back: Normal range of motion and neck supple.  Neurological:     General: No focal deficit present.     Mental Status: She is alert and oriented to person, place, and time.  Psychiatric:        Mood and Affect: Mood normal.        Behavior: Behavior normal.     ASSESSMENT AND PLAN: 1. Dysuria (Primary) - Routine screening.  - POCT URINALYSIS DIP (CLINITEK); Future - Urine Culture - Cervicovaginal ancillary only    Patient was given the opportunity to ask questions.  Patient verbalized understanding of the plan and was able to repeat key elements of the plan. Patient was given clear instructions to go to Emergency Department or return to medical center if symptoms don't improve, worsen, or new problems develop.The patient verbalized understanding.   Orders Placed This Encounter  Procedures   Urine Culture   POCT URINALYSIS DIP (CLINITEK)    Return in about 1 week (around 04/06/2023) for Follow-Up or next available with Georganna Skeans, MD .  Rema Fendt, NP

## 2023-03-30 NOTE — Progress Notes (Signed)
Patient states off and on burning when she urinates.

## 2023-03-31 LAB — CERVICOVAGINAL ANCILLARY ONLY
Bacterial Vaginitis (gardnerella): NEGATIVE
Candida Glabrata: NEGATIVE
Candida Vaginitis: NEGATIVE
Chlamydia: NEGATIVE
Comment: NEGATIVE
Comment: NEGATIVE
Comment: NEGATIVE
Comment: NEGATIVE
Comment: NEGATIVE
Comment: NORMAL
Neisseria Gonorrhea: NEGATIVE
Trichomonas: NEGATIVE

## 2023-04-02 LAB — URINE CULTURE

## 2023-04-27 ENCOUNTER — Other Ambulatory Visit: Payer: Self-pay

## 2023-04-27 ENCOUNTER — Ambulatory Visit: Payer: Commercial Managed Care - PPO | Admitting: Obstetrics and Gynecology

## 2023-04-27 ENCOUNTER — Encounter: Payer: Self-pay | Admitting: Obstetrics and Gynecology

## 2023-04-27 VITALS — BP 115/65 | HR 68 | Ht 62.0 in | Wt 123.0 lb

## 2023-04-27 DIAGNOSIS — Z86018 Personal history of other benign neoplasm: Secondary | ICD-10-CM | POA: Diagnosis not present

## 2023-04-27 DIAGNOSIS — N9489 Other specified conditions associated with female genital organs and menstrual cycle: Secondary | ICD-10-CM

## 2023-04-27 LAB — POCT URINALYSIS DIP (DEVICE)
Bilirubin Urine: NEGATIVE
Glucose, UA: NEGATIVE mg/dL
Hgb urine dipstick: NEGATIVE
Ketones, ur: NEGATIVE mg/dL
Leukocytes,Ua: NEGATIVE
Nitrite: NEGATIVE
Protein, ur: NEGATIVE mg/dL
Specific Gravity, Urine: 1.025 (ref 1.005–1.030)
Urobilinogen, UA: 0.2 mg/dL (ref 0.0–1.0)
pH: 7 (ref 5.0–8.0)

## 2023-04-27 MED ORDER — NYSTATIN 100000 UNIT/GM EX CREA: TOPICAL_CREAM | CUTANEOUS | 1 refills | Status: DC

## 2023-04-27 NOTE — Progress Notes (Signed)
 Pt reports burning when Urinates.

## 2023-04-27 NOTE — Progress Notes (Signed)
   GYNECOLOGY PROGRESS NOTE  History:  33 y.o. [id number] presents to [facility] office today for problem gyn visit. She reports she had an ultrasound in [place] and found to have fibroid that was 2cm in August. Reports some lower abdominal pain, unsure if it is fibroid, or c-section. Reports normal period 5-7 days and once every month. Denies painful periods or heavy bleeding. History of 2 cesarean sections. She reports not on birth control.  Reports some burning with urination, but reports she feels it if she hasn't had water , will resolve if she is drinking enough water . Reports urine and swabs have been negative. Cut out sugar and soda drinks, feels she is not drinking enough.    The following portions of the patient's history were reviewed and updated as appropriate: allergies, current medications, past family history, past medical history, past social history, past surgical history and problem list. Last pap smear on 9/24 was normal, NILM  Health Maintenance Due  Topic Date Due   COVID-19 Vaccine (3 - 2024-25 season) 12/13/2022     Review of Systems:  Pertinent items are noted in HPI.   Objective:  Physical Exam Blood pressure 115/65, pulse 68, height 5' 2 (1.575 m), weight 123 lb (55.8 kg), last menstrual period 04/06/2023, currently breastfeeding. VS reviewed, nursing note reviewed,  Constitutional: well developed, well nourished, no distress HEENT: normocephalic CV: normal rate Pulm/chest wall: normal effort Breast Exam: deferred Abdomen: soft Neuro: alert and oriented x 3 Skin: warm, dry Psych: affect normal Pelvic exam:  external genitalia normal, mild erythma noted in vagina    Assessment & Plan:  1. Vaginal burning (Primary) UA negative, decline need for swab and STD today  Some irritation present will send nystatin  - nystatin  cream (MYCOSTATIN ); Apply thin layer to the area twice a day as needed  Dispense: 30 g; Refill: 1  2. History of uterine fibroid Discussed  management, usual trial OCP or IUD to regulate cycles. Not having irregular cycle or heavy bleeding, denies need for contraception today Will obtain pelvic to reassess fibroid  - US  PELVIC COMPLETE WITH TRANSVAGINAL; Future  Future Appointments  Date Time Provider Department Center  05/04/2023  3:30 PM [facility]  1 [facility]  [place]     Nidia Daring, [place]

## 2023-05-04 ENCOUNTER — Ambulatory Visit (HOSPITAL_COMMUNITY)
Admission: RE | Admit: 2023-05-04 | Discharge: 2023-05-04 | Disposition: A | Discharge status: Home / Self Care | Payer: Commercial Managed Care - PPO | Source: Ambulatory Visit | Attending: Obstetrics and Gynecology | Admitting: Obstetrics and Gynecology

## 2023-05-04 DIAGNOSIS — Z86018 Personal history of other benign neoplasm: Secondary | ICD-10-CM | POA: Diagnosis present

## 2023-06-03 ENCOUNTER — Telehealth: Payer: Self-pay | Admitting: Family Medicine

## 2023-06-03 NOTE — Telephone Encounter (Signed)
 Marland Kitchen

## 2023-08-13 ENCOUNTER — Ambulatory Visit (INDEPENDENT_AMBULATORY_CARE_PROVIDER_SITE_OTHER): Admitting: Family

## 2023-08-13 ENCOUNTER — Encounter: Payer: Self-pay | Admitting: Family

## 2023-08-13 VITALS — BP 117/77 | HR 85 | Temp 98.0°F | Resp 16 | Ht 62.0 in | Wt 117.2 lb

## 2023-08-13 DIAGNOSIS — Z131 Encounter for screening for diabetes mellitus: Secondary | ICD-10-CM | POA: Diagnosis not present

## 2023-08-13 LAB — POCT GLYCOSYLATED HEMOGLOBIN (HGB A1C): Hemoglobin A1C: 5.9 % — AB (ref 4.0–5.6)

## 2023-08-13 NOTE — Progress Notes (Signed)
 Patient ID: Michelle Velez, female    DOB: 07-08-1990  MRN: [id number]  CC: Diabetes Screening  Subjective: Michelle Velez is a 33 y.o. female who presents for diabetes screening.   Her concerns today include:  States she would like to be screened for diabetes. States she craves cookies and chocolate sometimes. She does not exercise. Patient confirms she does not have any extremity tingling. No further issues/concerns for discussion today.   Patient Active Problem List   Diagnosis Date Noted   Vitamin D deficiency 09/17/2017   Unilateral inguinal hernia 06/16/2017   Eczema 05/19/2017   Iron  deficiency anemia 05/19/2017   Routine history and physical examination of adult 05/19/2017     Current Outpatient Medications on File Prior to Visit  Medication Sig Dispense Refill   nystatin  cream (MYCOSTATIN ) Apply thin layer to the area twice a day as needed 30 g 1   Iron , Ferrous Sulfate , 325 (65 Fe) MG TABS Take 325 mg by mouth 2 (two) times daily. (Patient not taking: Reported on 03/30/2023) 60 tablet 3   No current facility-administered medications on file prior to visit.    No Known Allergies  Social History   Socioeconomic History   Marital status: Single    Spouse name: Not on file   Number of children: Not on file   Years of education: Not on file   Highest education level: Not on file  Occupational History   Not on file  Tobacco Use   Smoking status: Never   Smokeless tobacco: Never  Vaping Use   Vaping status: Never Used  Substance and Sexual Activity   Alcohol use: Never   Drug use: Never   Sexual activity: Not Currently    Birth control/protection: None  Other Topics Concern   Not on file  Social History Narrative   Not on file   Social Drivers of Health   Financial Resource Strain: Low Risk  (12/23/2022)   Overall Financial Resource Strain (CARDIA)    Difficulty of Paying Living Expenses: Not hard at all  Food Insecurity: No Food Insecurity (12/23/2022)    Hunger Vital Sign    Worried About Running Out of Food in the Last Year: Never true    Ran Out of Food in the Last Year: Never true  Transportation Needs: No Transportation Needs (12/23/2022)   PRAPARE - Administrator, Civil Service (Medical): No    Lack of Transportation (Non-Medical): No  Physical Activity: Insufficiently Active (12/23/2022)   Exercise Vital Sign    Days of Exercise per Week: 3 days    Minutes of Exercise per Session: 30 min  Stress: Stress Concern Present (12/23/2022)   [employer] of Occupational Health - Occupational Stress Questionnaire    Feeling of Stress : Rather much  Social Connections: Socially Isolated (12/23/2022)   Social Connection and Isolation Panel [NHANES]    Frequency of Communication with Friends and Family: Twice a week    Frequency of Social Gatherings with Friends and Family: Never    Attends Religious Services: Never    Database administrator or Organizations: No    Attends Banker Meetings: Never    Marital Status: Divorced  Catering manager Violence: Not At Risk (12/23/2022)   Humiliation, Afraid, Rape, and Kick questionnaire    Fear of Current or Ex-Partner: No    Emotionally Abused: No    Physically Abused: No    Sexually Abused: No    Family History  Problem Relation  Age of Onset   Arthritis Mother    Diabetes Mother    Asthma Mother    Other Father        unknown   Asthma Brother    Diabetes Maternal Grandmother     Past Surgical History:  Procedure Laterality Date   BREAST BIOPSY Left 08/29/2018   CESAREAN SECTION     CESAREAN SECTION  11/06/2019   Procedure: CESAREAN SECTION;  Surgeon: Abigail Abler, MD;  Location: [facility];  Service: Obstetrics;;   DILATION AND CURETTAGE OF UTERUS      ROS: Review of Systems Negative except as stated above  PHYSICAL EXAM: BP 117/77   Pulse 85   Temp 98 F (36.7 C) (Oral)   Resp 16   Ht 5\' 2"  (1.575 m)   Wt 117 lb 3.2 oz (53.2 kg)   LMP  08/05/2023 (Exact Date)   SpO2 97%   BMI 21.44 kg/m   Physical Exam HENT:     Head: Normocephalic and atraumatic.     Nose: Nose normal.     Mouth/Throat:     Mouth: Mucous membranes are moist.     Pharynx: Oropharynx is clear.  Eyes:     Extraocular Movements: Extraocular movements intact.     Conjunctiva/sclera: Conjunctivae normal.     Pupils: Pupils are equal, round, and reactive to light.  Cardiovascular:     Rate and Rhythm: Normal rate and regular rhythm.     Pulses: Normal pulses.     Heart sounds: Normal heart sounds.  Pulmonary:     Effort: Pulmonary effort is normal.     Breath sounds: Normal breath sounds.  Musculoskeletal:        General: Normal range of motion.     Cervical back: Normal range of motion and neck supple.  Neurological:     General: No focal deficit present.     Mental Status: She is alert and oriented to person, place, and time.  Psychiatric:        Mood and Affect: Mood normal.        Behavior: Behavior normal.      ASSESSMENT AND PLAN: 1. Diabetes mellitus screening (Primary) - Routine screening.  - Follow-up with primary provider as scheduled. - POCT glycosylated hemoglobin (Hb A1C); Future   Patient was given the opportunity to ask questions.  Patient verbalized understanding of the plan and was able to repeat key elements of the plan. Patient was given clear instructions to go to Emergency Department or return to medical center if symptoms don't improve, worsen, or new problems develop.The patient verbalized understanding.   Orders Placed This Encounter  Procedures   POCT glycosylated hemoglobin (Hb A1C)     Return for Follow-Up or next available with Abraham Abo, MD.  Senaida Dama, NP

## 2023-08-13 NOTE — Progress Notes (Signed)
 Patients wants to be tested for diabetes and has been having tingling in legs and sometimes hands

## 2023-08-16 ENCOUNTER — Encounter: Payer: Self-pay | Admitting: Family

## 2023-09-23 ENCOUNTER — Encounter: Payer: Self-pay | Admitting: Physician Assistant

## 2023-10-05 ENCOUNTER — Ambulatory Visit
Admission: EM | Admit: 2023-10-05 | Discharge: 2023-10-05 | Disposition: A | Discharge status: Home / Self Care | Attending: Family Medicine | Admitting: Family Medicine

## 2023-10-05 DIAGNOSIS — R202 Paresthesia of skin: Secondary | ICD-10-CM | POA: Diagnosis not present

## 2023-10-05 NOTE — ED Provider Notes (Signed)
 [name]    CSN: [id number] Arrival date & time: 10/05/23  1716      History   Chief Complaint Chief Complaint  Patient presents with   Leg Pain    HPI Michelle Velez is a 33 y.o. female.    Leg Pain  Here for burning and tingling in her legs and feet.  This has been bothering her since April of this year.  It will improve with lying down or walking around.  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  The symptoms are in her feet and sometimes in her calves.  No injury or trauma and no fever.  No rash.  She does occasionally have a little tingling in 1 hand or another, but she thinks that is mainly when she has been asleep in a certain position.  Her A1c in May was 5.9.  She has been doing better on her diet and her sugar this morning at home was 101.  She has been anemic in the past  Last menstrual cycle was June 19.  NKDA  Past Medical History:  Diagnosis Date   Anemia    Gestational thrombocytopenia (HCC)    Gestational thrombocytopenia without hemorrhage in third trimester (HCC) 08/22/2019   Likely gestational though review of Care Everywhere records shows has intermittently had during non-pregnant times as well Most recent checks 129>115>91>133 on 08/22/2019 Nadir of 74 during admission for last delivery Recheck at 36-37 wks, consider heme consult if again dips below 100k   History of cesarean delivery 04/13/2019   VBAC consent signed 08/08/2019 Op note in Care Everywhere 03/15/2015   Inguinal hernia 2019   Mild preeclampsia 11/06/2019   Postoperative ileus (HCC) 11/08/2019   Scoliosis 2006    Patient Active Problem List   Diagnosis Date Noted   Vitamin D deficiency 09/17/2017   Unilateral inguinal hernia 06/16/2017   Eczema 05/19/2017   Iron  deficiency anemia 05/19/2017   Routine history and physical examination of adult 05/19/2017    Past Surgical History:  Procedure Laterality Date   BREAST BIOPSY Left 08/29/2018    CESAREAN SECTION     CESAREAN SECTION  11/06/2019   Procedure: CESAREAN SECTION;  Surgeon: Zina Jerilynn LABOR, MD;  Location: [facility];  Service: Obstetrics;;   DILATION AND CURETTAGE OF UTERUS      OB History     Gravida  3   Para  2   Term  2   Preterm      AB  1   Living  2      SAB  1   IAB      Ectopic      Multiple  0   Live Births  2            Home Medications    Prior to Admission medications   Medication Sig Start Date End Date Taking? Authorizing Provider  Iron , Ferrous Sulfate , 325 (65 Fe) MG TABS Take 325 mg by mouth 2 (two) times daily. Patient not taking: Reported on 03/30/2023 07/02/21   Tanda Bleacher, MD  nystatin  cream (MYCOSTATIN ) Apply thin layer to the area twice a day as needed 04/27/23   Delores Nidia CROME, FNP    Family History Family History  Problem Relation Age of Onset   Arthritis Mother    Diabetes Mother    Asthma Mother    Other Father        unknown   Asthma Brother  Diabetes Maternal Grandmother     Social History Social History   Tobacco Use   Smoking status: Never   Smokeless tobacco: Never  Vaping Use   Vaping status: Never Used  Substance Use Topics   Alcohol use: Never   Drug use: Never     Allergies   Patient has no known allergies.   Review of Systems Review of Systems   Physical Exam Triage Vital Signs ED Triage Vitals [10/05/23 1805]  Encounter Vitals Group     BP 124/73     Girls Systolic BP Percentile      Girls Diastolic BP Percentile      Boys Systolic BP Percentile      Boys Diastolic BP Percentile      Pulse Rate 66     Resp 16     Temp 97.9 F (36.6 C)     Temp Source Oral     SpO2 99 %     Weight      Height      Head Circumference      Peak Flow      Pain Score      Pain Loc      Pain Education      Exclude from Growth Chart    No data found.  Updated Vital Signs BP 124/73 (BP Location: Left Arm)   Pulse 66   Temp 97.9 F (36.6 C) (Oral)   Resp 16   LMP  09/30/2023 (Approximate)   SpO2 99%   Visual Acuity Right Eye Distance:   Left Eye Distance:   Bilateral Distance:    Right Eye Near:   Left Eye Near:    Bilateral Near:     Physical Exam Vitals reviewed.  Constitutional:      General: She is not in acute distress.    Appearance: She is not ill-appearing, toxic-appearing or diaphoretic.  HENT:     Mouth/Throat:     Mouth: Mucous membranes are moist.   Eyes:     Extraocular Movements: Extraocular movements intact.     Conjunctiva/sclera: Conjunctivae normal.     Pupils: Pupils are equal, round, and reactive to light.    Cardiovascular:     Rate and Rhythm: Normal rate and regular rhythm.     Heart sounds: No murmur heard. Pulmonary:     Effort: Pulmonary effort is normal.     Breath sounds: Normal breath sounds.   Musculoskeletal:     Cervical back: Neck supple.  Lymphadenopathy:     Cervical: No cervical adenopathy.   Skin:    Coloration: Skin is not jaundiced or pale.     Findings: No rash.   Neurological:     General: No focal deficit present.     Mental Status: She is alert and oriented to person, place, and time.     Cranial Nerves: No cranial nerve deficit.     Sensory: No sensory deficit.     Motor: No weakness.     Coordination: Coordination normal.     Gait: Gait normal.     Deep Tendon Reflexes: Reflexes normal.   Psychiatric:        Behavior: Behavior normal.      UC Treatments / Results  Labs (all labs ordered are listed, but only abnormal results are displayed) Labs Reviewed  CBC  BASIC METABOLIC PANEL WITH GFR  TSH  VITAMIN B12    EKG   Radiology No results found.  Procedures Procedures (including critical care time)  Medications Ordered in UC Medications - No data to display  Initial Impression / Assessment and Plan / UC Course  I have reviewed the triage vital signs and the nursing notes.  Pertinent labs & imaging results that were available during my care of the  patient were reviewed by me and considered in my medical decision making (see chart for details).     CBC, BMP, TSH, and vitamin B12 are drawn today to assess potential causes for her paresthesias symptoms.  staff will notify her if anything significantly abnormal.  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  I have also asked her to make a follow-up appointment with her primary care about this issue. Final Clinical Impressions(s) / UC Diagnoses   Final diagnoses:  Paresthesias   Discharge Instructions   None    ED Prescriptions   None    PDMP not reviewed this encounter.   Vonna Sharlet POUR, MD 10/05/23 [phone]

## 2023-10-05 NOTE — ED Triage Notes (Signed)
 Patient reports leg and feet have been tingling for several weeks.

## 2023-10-05 NOTE — Discharge Instructions (Signed)
 We have drawn blood to check your blood counts, electrolytes, thyroid function, and B12 level.  Our staff will notify you if something is significantly abnormal, and your results will go directly to the MyChart.  Please try changing your position while working at your desk often.  Also call your primary care and make a follow-up appointment with them about this issue.

## 2023-10-06 ENCOUNTER — Telehealth: Payer: Self-pay | Admitting: *Deleted

## 2023-10-06 ENCOUNTER — Ambulatory Visit (HOSPITAL_COMMUNITY): Payer: Self-pay

## 2023-10-06 LAB — CBC
Hematocrit: 35 % (ref 34.0–46.6)
Hemoglobin: 10.2 g/dL — ABNORMAL LOW (ref 11.1–15.9)
MCH: 23.8 pg — ABNORMAL LOW (ref 26.6–33.0)
MCHC: 29.1 g/dL — ABNORMAL LOW (ref 31.5–35.7)
MCV: 82 fL (ref 79–97)
Platelets: 143 10*3/uL — ABNORMAL LOW (ref 150–450)
RBC: 4.29 x10E6/uL (ref 3.77–5.28)
RDW: 20.9 % — ABNORMAL HIGH (ref 11.7–15.4)
WBC: 5.2 10*3/uL (ref 3.4–10.8)

## 2023-10-06 LAB — TSH: TSH: 0.846 u[IU]/mL (ref 0.450–4.500)

## 2023-10-06 LAB — BASIC METABOLIC PANEL WITH GFR
BUN/Creatinine Ratio: 20 (ref 9–23)
BUN: 13 mg/dL (ref 6–20)
CO2: 20 mmol/L (ref 20–29)
Calcium: 9.5 mg/dL (ref 8.7–10.2)
Chloride: 105 mmol/L (ref 96–106)
Creatinine, Ser: 0.65 mg/dL (ref 0.57–1.00)
Glucose: 85 mg/dL (ref 70–99)
Potassium: 4.3 mmol/L (ref 3.5–5.2)
Sodium: 141 mmol/L (ref 134–144)
eGFR: 118 mL/min/{1.73_m2} (ref >59)

## 2023-10-06 LAB — VITAMIN B12: Vitamin B-12: 1295 pg/mL — ABNORMAL HIGH (ref 232–1245)

## 2023-10-06 NOTE — Telephone Encounter (Signed)
 Patient called and is aware that ordering provider will reach with labs results

## 2023-10-08 ENCOUNTER — Ambulatory Visit (INDEPENDENT_AMBULATORY_CARE_PROVIDER_SITE_OTHER): Admitting: Family Medicine

## 2023-10-08 ENCOUNTER — Encounter: Payer: Self-pay | Admitting: Family Medicine

## 2023-10-08 VITALS — BP 130/77 | HR 73 | Wt 111.0 lb

## 2023-10-08 DIAGNOSIS — D509 Iron deficiency anemia, unspecified: Secondary | ICD-10-CM | POA: Diagnosis not present

## 2023-10-08 DIAGNOSIS — R202 Paresthesia of skin: Secondary | ICD-10-CM

## 2023-10-08 MED ORDER — GABAPENTIN 100 MG PO CAPS: 100.0000 mg | ORAL_CAPSULE | Freq: Every day | ORAL | 0 refills | Status: DC

## 2023-10-08 NOTE — Progress Notes (Unsigned)
 Established Patient Office Visit  Subjective    Patient ID: Michelle Velez, female    DOB: 17-May-1990  Age: 33 y.o. MRN: [id number]  CC:  Chief Complaint  Patient presents with   Hospitalization Follow-up    High b12 levels    Anxiety    Patient having concerns of anxiety     HPI Michelle Velez presents for follow up of iron  deficiency anemia. Patient reports that she is having some numbness and tingling at the tips of her fingers. Patient reports that she is also having some anxiety.   Outpatient Encounter Medications as of 10/08/2023  Medication Sig   gabapentin  (NEURONTIN ) 100 MG capsule Take 1 capsule (100 mg total) by mouth at bedtime.   Iron , Ferrous Sulfate , 325 (65 Fe) MG TABS Take 325 mg by mouth 2 (two) times daily.   nystatin  cream (MYCOSTATIN ) Apply thin layer to the area twice a day as needed (Patient not taking: Reported on 10/08/2023)   No facility-administered encounter medications on file as of 10/08/2023.    Past Medical History:  Diagnosis Date   Anemia    Gestational thrombocytopenia (HCC)    Gestational thrombocytopenia without hemorrhage in third trimester (HCC) 08/22/2019   Likely gestational though review of Care Everywhere records shows has intermittently had during non-pregnant times as well Most recent checks 129>115>91>133 on 08/22/2019 Nadir of 74 during admission for last delivery Recheck at 36-37 wks, consider heme consult if again dips below 100k   History of cesarean delivery 04/13/2019   VBAC consent signed 08/08/2019 Op note in Care Everywhere 03/15/2015   Inguinal hernia 2019   Mild preeclampsia 11/06/2019   Postoperative ileus (HCC) 11/08/2019   Scoliosis 2006    Past Surgical History:  Procedure Laterality Date   BREAST BIOPSY Left 08/29/2018   CESAREAN SECTION     CESAREAN SECTION  11/06/2019   Procedure: CESAREAN SECTION;  Surgeon: Zina Jerilynn LABOR, MD;  Location: [facility];  Service: Obstetrics;;   DILATION AND CURETTAGE OF UTERUS       Family History  Problem Relation Age of Onset   Arthritis Mother    Diabetes Mother    Asthma Mother    Other Father        unknown   Asthma Brother    Diabetes Maternal Grandmother     Social History   Socioeconomic History   Marital status: Single    Spouse name: Not on file   Number of children: Not on file   Years of education: Not on file   Highest education level: Not on file  Occupational History   Not on file  Tobacco Use   Smoking status: Never   Smokeless tobacco: Never  Vaping Use   Vaping status: Never Used  Substance and Sexual Activity   Alcohol use: Never   Drug use: Never   Sexual activity: Not Currently    Birth control/protection: None  Other Topics Concern   Not on file  Social History Narrative   Not on file   Social Drivers of Health   Financial Resource Strain: Low Risk  (12/23/2022)   Overall Financial Resource Strain (CARDIA)    Difficulty of Paying Living Expenses: Not hard at all  Food Insecurity: No Food Insecurity (12/23/2022)   Hunger Vital Sign    Worried About Running Out of Food in the Last Year: Never true    Ran Out of Food in the Last Year: Never true  Transportation Needs: No Transportation Needs (12/23/2022)  PRAPARE - Administrator, Civil Service (Medical): No    Lack of Transportation (Non-Medical): No  Physical Activity: Insufficiently Active (12/23/2022)   Exercise Vital Sign    Days of Exercise per Week: 3 days    Minutes of Exercise per Session: 30 min  Stress: Stress Concern Present (12/23/2022)   [employer] of Occupational Health - Occupational Stress Questionnaire    Feeling of Stress : Rather much  Social Connections: Socially Isolated (12/23/2022)   Social Connection and Isolation Panel    Frequency of Communication with Friends and Family: Twice a week    Frequency of Social Gatherings with Friends and Family: Never    Attends Religious Services: Never    Database administrator or  Organizations: No    Attends Banker Meetings: Never    Marital Status: Divorced  Catering manager Violence: Not At Risk (12/23/2022)   Humiliation, Afraid, Rape, and Kick questionnaire    Fear of Current or Ex-Partner: No    Emotionally Abused: No    Physically Abused: No    Sexually Abused: No    Review of Systems  All other systems reviewed and are negative.       Objective    BP 130/77 (BP Location: Left Arm, Patient Position: Sitting, Cuff Size: Normal)   Pulse 73   Wt 111 lb (50.3 kg)   LMP 09/30/2023 (Approximate)   SpO2 98%   Breastfeeding No   BMI 20.30 kg/m   Physical Exam Vitals and nursing note reviewed.  Constitutional:      General: She is not in acute distress.  Cardiovascular:     Rate and Rhythm: Normal rate and regular rhythm.  Pulmonary:     Effort: Pulmonary effort is normal.     Breath sounds: Normal breath sounds.  Abdominal:     Palpations: Abdomen is soft.     Tenderness: There is no abdominal tenderness.   Neurological:     General: No focal deficit present.     Mental Status: She is alert and oriented to person, place, and time.          Assessment & Plan:  1. Iron  deficiency anemia, unspecified iron  deficiency anemia type (Primary) Continue iron  supplements. Referral to GI for further eval/mgt. Eval by GYN has been unremarkable to this point  2. Paresthesia Neurontin  prescribed.     Return in about 3 months (around 01/08/2024) for follow up.   Tanda Raguel SQUIBB, MD

## 2023-10-09 ENCOUNTER — Emergency Department (HOSPITAL_COMMUNITY)
Admission: EM | Admit: 2023-10-09 | Discharge: 2023-10-09 | Disposition: A | Discharge status: Home / Self Care | Attending: Emergency Medicine | Admitting: Emergency Medicine

## 2023-10-09 ENCOUNTER — Emergency Department (HOSPITAL_COMMUNITY)

## 2023-10-09 ENCOUNTER — Encounter (HOSPITAL_COMMUNITY): Payer: Self-pay

## 2023-10-09 ENCOUNTER — Other Ambulatory Visit: Payer: Self-pay

## 2023-10-09 DIAGNOSIS — R110 Nausea: Secondary | ICD-10-CM | POA: Diagnosis not present

## 2023-10-09 DIAGNOSIS — R1033 Periumbilical pain: Secondary | ICD-10-CM | POA: Diagnosis present

## 2023-10-09 DIAGNOSIS — R109 Unspecified abdominal pain: Secondary | ICD-10-CM

## 2023-10-09 DIAGNOSIS — R0602 Shortness of breath: Secondary | ICD-10-CM | POA: Insufficient documentation

## 2023-10-09 LAB — URINALYSIS, ROUTINE W REFLEX MICROSCOPIC
Bacteria, UA: NONE SEEN
Bilirubin Urine: NEGATIVE
Glucose, UA: NEGATIVE mg/dL
Hgb urine dipstick: NEGATIVE
Ketones, ur: 80 mg/dL — AB
Leukocytes,Ua: NEGATIVE
Nitrite: NEGATIVE
Protein, ur: 30 mg/dL — AB
Specific Gravity, Urine: 1.03 (ref 1.005–1.030)
pH: 5 (ref 5.0–8.0)

## 2023-10-09 LAB — COMPREHENSIVE METABOLIC PANEL WITH GFR
ALT: 14 U/L (ref 0–44)
AST: 24 U/L (ref 15–41)
Albumin: 4.1 g/dL (ref 3.5–5.0)
Alkaline Phosphatase: 57 U/L (ref 38–126)
Anion gap: 11 (ref 5–15)
BUN: 8 mg/dL (ref 6–20)
CO2: 24 mmol/L (ref 22–32)
Calcium: 9.3 mg/dL (ref 8.9–10.3)
Chloride: 102 mmol/L (ref 98–111)
Creatinine, Ser: 0.69 mg/dL (ref 0.44–1.00)
GFR, Estimated: >60 mL/min (ref >60)
Glucose, Bld: 87 mg/dL (ref 70–99)
Potassium: 3.3 mmol/L — ABNORMAL LOW (ref 3.5–5.1)
Sodium: 137 mmol/L (ref 135–145)
Total Bilirubin: 0.5 mg/dL (ref 0.0–1.2)
Total Protein: 7.5 g/dL (ref 6.5–8.1)

## 2023-10-09 LAB — TROPONIN I (HIGH SENSITIVITY)
Troponin I (High Sensitivity): 2 ng/L (ref <18)
Troponin I (High Sensitivity): 3 ng/L (ref <18)

## 2023-10-09 LAB — CBC
HCT: 36.9 % (ref 36.0–46.0)
Hemoglobin: 11.2 g/dL — ABNORMAL LOW (ref 12.0–15.0)
MCH: 24.2 pg — ABNORMAL LOW (ref 26.0–34.0)
MCHC: 30.4 g/dL (ref 30.0–36.0)
MCV: 79.7 fL — ABNORMAL LOW (ref 80.0–100.0)
Platelets: 142 10*3/uL — ABNORMAL LOW (ref 150–400)
RBC: 4.63 MIL/uL (ref 3.87–5.11)
RDW: 21.5 % — ABNORMAL HIGH (ref 11.5–15.5)
WBC: 3.9 10*3/uL — ABNORMAL LOW (ref 4.0–10.5)
nRBC: 0 % (ref 0.0–0.2)

## 2023-10-09 LAB — HCG, SERUM, QUALITATIVE: Preg, Serum: NEGATIVE

## 2023-10-09 MED ORDER — DICYCLOMINE HCL 20 MG PO TABS: 20.0000 mg | ORAL_TABLET | Freq: Two times a day (BID) | ORAL | 0 refills | Status: DC

## 2023-10-09 NOTE — ED Provider Triage Note (Signed)
 Emergency Medicine Provider Triage Evaluation Note  Michelle Velez , a 33 y.o. female  was evaluated in triage.  Pt complains of shortness of breath, abdominal pain. States she had tingling in her feet last week, was seen at urgent care, diagnosed with anemia, started taking iron  and now has abdominal pain. Pain in lower abdomen bilaterally. Denies nausea or vomiting, endorses some diarrhea. This morning felt like she is having more trouble catching her breath. Denies history of similar symptoms previously. No chest pain  Review of Systems  Positive:  Negative:   Physical Exam  BP 123/85   Pulse 82   Temp 97.7 F (36.5 C) (Oral)   Resp 16   Ht 5' 2 (1.575 m)   Wt 50.3 kg   LMP 09/30/2023 (Approximate)   SpO2 100%   BMI 20.30 kg/m  Gen:   Awake, no distress   Resp:  Normal effort  MSK:   Moves extremities without difficulty  Other:    Medical Decision Making  Medically screening exam initiated at 12:09 PM.  Appropriate orders placed.  Michelle Velez was informed that the remainder of the evaluation will be completed by another provider, this initial triage assessment does not replace that evaluation, and the importance of remaining in the ED until their evaluation is complete.     Nora Lauraine LABOR, PA-C 10/09/23 [phone]

## 2023-10-09 NOTE — ED Triage Notes (Signed)
 Pt to ED c/o abdominal pain and shob x 2 days. Reports hx of anemia , recently started back taking PO Iron . Reports last BM yesterday. Denies vomiting.

## 2023-10-09 NOTE — Discharge Instructions (Signed)
 As you discussed, you can continue to use Bentyl to control your abdominal pain, and encourage you to follow-up with your primary care for continued monitoring of your lab work as well as progression of your condition.  Increase your fluid intake, and advance your diet as tolerated from clear liquids today to soft easy to digest foods, gradually progressing to normal diet over the next few days.  Please schedule an appointment in the next week or 2 weeks for follow-up with your primary care.

## 2023-10-09 NOTE — ED Provider Notes (Signed)
 [facility] Provider Note   CSN: [id number] Arrival date & time: 10/09/23  1120     Patient presents with: Abdominal Pain and Shortness of Breath   Michelle Velez is a 33 y.o. female who presents to the ED today with abdominal pain that is primarily periumbilical in nature, does not radiate, and she states that it has become worse today and was also paired with acute shortness of breath that began this morning.  At presentation she is not short of breath however she states that she was upon waking this morning.  Currently taking iron  supplementation orally for iron  deficiency anemia, also had previous history of abdominal surgery with C-section x 2.  She states that she has had regular periods for the last several months, with last menstrual cycle beginning around June 19, cycles ranging between 25 to 30 days with normal length around 5 days in duration.  She denies any vomiting but does have occasional nausea.  States that bowel movements are loose but infrequent, denies any hematochezia or melena.    Abdominal Pain Associated symptoms: shortness of breath   Shortness of Breath Associated symptoms: abdominal pain        Prior to Admission medications   Medication Sig Start Date End Date Taking? Authorizing Provider  dicyclomine (BENTYL) 20 MG tablet Take 1 tablet (20 mg total) by mouth 2 (two) times daily. 10/09/23  Yes Myriam Dorn BROCKS, PA  gabapentin  (NEURONTIN ) 100 MG capsule Take 1 capsule (100 mg total) by mouth at bedtime. 10/08/23   Tanda Bleacher, MD  Iron , Ferrous Sulfate , 325 (65 Fe) MG TABS Take 325 mg by mouth 2 (two) times daily. 07/02/21   Tanda Bleacher, MD  nystatin  cream (MYCOSTATIN ) Apply thin layer to the area twice a day as needed Patient not taking: Reported on 10/08/2023 04/27/23   Delores Nidia CROME, FNP    Allergies: Blueberry [vaccinium angustifolium]    Review of Systems  Respiratory:  Positive for shortness of  breath.   Gastrointestinal:  Positive for abdominal pain.  All other systems reviewed and are negative.   Updated Vital Signs BP 123/85   Pulse 82   Temp 97.7 F (36.5 C) (Oral)   Resp 16   Ht 5' 2 (1.575 m)   Wt 50.3 kg   LMP 09/30/2023 (Approximate)   SpO2 100%   BMI 20.30 kg/m   Physical Exam Vitals and nursing note reviewed.  Constitutional:      General: She is not in acute distress.    Appearance: Normal appearance.  HENT:     Head: Normocephalic and atraumatic.     Mouth/Throat:     Mouth: Mucous membranes are moist.     Pharynx: Oropharynx is clear.   Eyes:     General: No scleral icterus.    Extraocular Movements: Extraocular movements intact.     Conjunctiva/sclera: Conjunctivae normal.     Pupils: Pupils are equal, round, and reactive to light.   Neck:     Thyroid: No thyromegaly.   Cardiovascular:     Rate and Rhythm: Normal rate and regular rhythm.     Pulses: Normal pulses.     Heart sounds: Normal heart sounds. No murmur heard.    No friction rub. No gallop.  Pulmonary:     Effort: Pulmonary effort is normal.     Breath sounds: Normal breath sounds.  Abdominal:     General: Abdomen is flat. Bowel sounds are normal. There is no  distension.     Palpations: Abdomen is soft.     Tenderness: There is no abdominal tenderness. There is no right CVA tenderness or left CVA tenderness.   Musculoskeletal:        General: Normal range of motion.     Cervical back: Normal range of motion and neck supple.     Right lower leg: No edema.     Left lower leg: No edema.   Skin:    General: Skin is warm and dry.     Capillary Refill: Capillary refill takes less than 2 seconds.   Neurological:     General: No focal deficit present.     Mental Status: She is alert. Mental status is at baseline.   Psychiatric:        Mood and Affect: Mood normal.     (all labs ordered are listed, but only abnormal results are displayed) Labs Reviewed  CBC - Abnormal;  Notable for the following components:      Result Value   WBC 3.9 (*)    Hemoglobin 11.2 (*)    MCV 79.7 (*)    MCH 24.2 (*)    RDW 21.5 (*)    Platelets 142 (*)    All other components within normal limits  COMPREHENSIVE METABOLIC PANEL WITH GFR - Abnormal; Notable for the following components:   Potassium 3.3 (*)    All other components within normal limits  URINALYSIS, ROUTINE W REFLEX MICROSCOPIC - Abnormal; Notable for the following components:   APPearance HAZY (*)    Ketones, ur 80 (*)    Protein, ur 30 (*)    All other components within normal limits  HCG, SERUM, QUALITATIVE  TROPONIN I (HIGH SENSITIVITY)  TROPONIN I (HIGH SENSITIVITY)    EKG: None  Radiology: DG Chest 2 View Result Date: 10/09/2023 CLINICAL DATA:  Shortness of breath for 2 days. EXAM: CHEST - 2 VIEW COMPARISON:  None Available. FINDINGS: The heart size and mediastinal contours are within normal limits. Both lungs are clear. The visualized skeletal structures are unremarkable. IMPRESSION: Normal exam. Electronically Signed   By: Norleen DELENA Kil M.D.   On: 10/09/2023 13:13     Procedures   Medications Ordered in the ED - No data to display  Clinical Course as of 10/09/23 1514  Sat Oct 09, 2023  1354 DG Chest 2 View Reviewed interpreted, no acute cardiopulmonary findings at this time. [JG]  1355 CBC(!) Findings are stable compared to findings from 4 days prior. [JG]    Clinical Course User Index [JG] Myriam Dorn BROCKS, PA                                 Medical Decision Making Amount and/or Complexity of Data Reviewed Labs:  Decision-making details documented in ED Course. Radiology:  Decision-making details documented in ED Course.  Risk Prescription drug management.   Medical Decision Making:   Shauntelle Jamerson is a 33 y.o. female who presented to the ED today with abdominal pain and shortness of breath detailed above.    External chart has been reviewed including previous lab findings  including B12, TSH, and full lab workup obtained at visit on 6/24.SABRA Complete initial physical exam performed, notably the patient  was alert and oriented in no apparent distress..    Reviewed and confirmed nursing documentation for past medical history, family history, social history.    Initial Assessment:   With the  patient's presentation of abdominal pain and shortness of breath, differential diagnosis includes gastritis, pulmonary embolus, ACS.   Initial Plan:  Evaluate serum hCG to rule out pregnancy Screening labs including CBC and Metabolic panel to evaluate for infectious or metabolic etiology of disease.  Urinalysis with reflex culture ordered to evaluate for UTI or relevant urologic/nephrologic pathology.  CXR to evaluate for structural/infectious intrathoracic pathology.  EKG and serial troponin to evaluate for cardiac pathology. Objective evaluation as below reviewed   Initial Study Results:   Laboratory  All laboratory results reviewed without evidence of clinically relevant pathology.   Exceptions include: White count is 3.9 which is stable compared to previous findings from 4 days prior.  Rest of CBC is also stable compared to prior labs on 4 days ago.  EKG EKG was reviewed independently. Rate, rhythm, axis, intervals all examined and without medically relevant abnormality. ST segments without concerns for elevations.    Radiology:  All images reviewed independently. Agree with radiology report at this time.   DG Chest 2 View Result Date: 10/09/2023 CLINICAL DATA:  Shortness of breath for 2 days. EXAM: CHEST - 2 VIEW COMPARISON:  None Available. FINDINGS: The heart size and mediastinal contours are within normal limits. Both lungs are clear. The visualized skeletal structures are unremarkable. IMPRESSION: Normal exam. Electronically Signed   By: Norleen DELENA Kil M.D.   On: 10/09/2023 13:13     Reassessment and Plan:   Chest x-ray does not show any acute cardiopulmonary  disease, rest of the workup is stable and unremarkable.  EKG is unremarkable, troponin is also unremarkable.  I also do not believe pain is cardiac in etiology.  With active bowel sounds, and generalized abdominal pain, believe this is due to possible gastritis or irritable bowel syndrome.  Will prescribe a course of outpatient Bentyl and have patient follow-up with her primary care for continued follow-up.  This was discussed with the patient and she has no further concerns at this time.       Final diagnoses:  Abdominal pain, unspecified abdominal location    ED Discharge Orders          Ordered    dicyclomine (BENTYL) 20 MG tablet  2 times daily        10/09/23 1512               Myriam Dorn BROCKS, [place] 10/09/23 1516    Elnor Jayson DELENA, DO 10/10/23 [phone]

## 2023-10-11 ENCOUNTER — Encounter: Payer: Self-pay | Admitting: Family Medicine

## 2023-10-26 ENCOUNTER — Telehealth: Payer: Self-pay

## 2023-10-26 NOTE — Telephone Encounter (Signed)
 FYI Only or Action Required?: Action required by provider: clinical question for provider.  Patient was last seen in primary care on 10/08/2023 by Tanda Bleacher, MD.  Called Nurse Triage reporting concerns of medication     Copied from CRM [id number]. Topic: Clinical - Medical Advice >> Oct 26, 2023  8:06 AM Michelle Velez wrote: Reason for CRM: [place] and [place] UTI. She was given nitrofurantoin antibiotic; she's now wondering if this medication will affect her anemia since she's already anemic. She wants to know if she should be taking this medication. Best call back number is [phone].

## 2023-10-26 NOTE — Telephone Encounter (Signed)
 Routing to PCP for review.

## 2023-10-27 ENCOUNTER — Other Ambulatory Visit: Payer: Self-pay | Admitting: Family Medicine

## 2023-10-27 NOTE — Telephone Encounter (Signed)
Call placed to patient and no answer. 

## 2023-10-28 ENCOUNTER — Telehealth: Payer: Self-pay

## 2023-10-28 NOTE — Telephone Encounter (Signed)
 LVM informing patient that PCP states that she can take the medication for 5 days.      Copied from CRM [id number]. Topic: Clinical - Medical Advice >> Oct 27, 2023  3:33 PM Sasha H wrote: Reason for CRM: Pt was returning nurse in officer call. Please reach out to pt ----------------------------------------------------------------------- From previous Reason for Contact - Other: Reason for CRM:

## 2023-12-02 NOTE — Progress Notes (Unsigned)
 Ellouise Console, PA-C [address] [place], [place]  [address] Phone: [phone]   Gastroenterology Consultation  Referring Provider:     Tanda Bleacher, MD Primary Care Physician:  Tanda Bleacher, MD Primary Gastroenterologist:  Ellouise Console, PA-C / Glendia Holt, MD  Reason for Consultation:     Rectal bleeding, Iron  deficiency anemia        HPI:   Michelle Velez is a 33 y.o. y/o female referred for consultation & management  by Tanda Bleacher, MD. she is here today with her 41-year-old daughter.  Of note, patient had a positive at-home pregnancy test last week.  Currently pregnant.  LMP 10/28/2023.  New patient.  Here to evaluate rectal bleeding and iron  deficiency anemia.  Takes iron  tablet daily.  She has had chronic intermittent abdominal pain for several years.  No abdominal pain today.  Patient states she has noticed mild intermittent bright red blood in her stool intermittently for a few months.  She has not seen any blood in the toilet.  She states she has history of chronic anemia since age 38 and has been on and off iron  for many years.  Iron  causes increased abdominal pain and constipation, she takes it sporadically.  She tries to eat high-fiber diet which helps her constipation.  She denies family history of colon cancer or IBD.  Her mom has IBS.  Patient had menorrhagia several years ago but not recently.  She denies unintentional weight loss, nausea, or vomiting.  10/09/2023 evaluated at [facility] ED for periumbilical abdominal pain.  Was thought to have gastritis or IBS.  Treated with Bentyl  20 Mg twice daily.  Labs 10/09/2023 showed mild hypokalemia with potassium 3.3.  Otherwise normal CMP.  CBC showed Hgb 11.2, hematocrit 36, MCV 79, WBC 3.9.  04/2023 pelvic ultrasound: Normal.  09/2022 abdominal x-ray: Small stool burden.  12/2020 CT abdomen pelvis without contrast: Normal.  She has had 2 previous C-sections.  Past Medical History:  Diagnosis Date   Anemia     Gestational thrombocytopenia (HCC)    Gestational thrombocytopenia without hemorrhage in third trimester (HCC) 08/22/2019   Likely gestational though review of Care Everywhere records shows has intermittently had during non-pregnant times as well Most recent checks 129>115>91>133 on 08/22/2019 Nadir of 74 during admission for last delivery Recheck at 36-37 wks, consider heme consult if again dips below 100k   History of cesarean delivery 04/13/2019   VBAC consent signed 08/08/2019 Op note in Care Everywhere 03/15/2015   Inguinal hernia 2019   Mild preeclampsia 11/06/2019   Postoperative ileus (HCC) 11/08/2019   Scoliosis 2006    Past Surgical History:  Procedure Laterality Date   BREAST BIOPSY Left 08/29/2018   CESAREAN SECTION     CESAREAN SECTION  11/06/2019   Procedure: CESAREAN SECTION;  Surgeon: Zina Jerilynn LABOR, MD;  Location: [facility] LD ORS;  Service: Obstetrics;;   DILATION AND CURETTAGE OF UTERUS      Prior to Admission medications   Medication Sig Start Date End Date Taking? Authorizing Provider  dicyclomine  (BENTYL ) 20 MG tablet Take 1 tablet (20 mg total) by mouth 2 (two) times daily. 10/09/23   Myriam Dorn BROCKS, PA  gabapentin  (NEURONTIN ) 100 MG capsule Take 1 capsule (100 mg total) by mouth at bedtime. 10/08/23   Tanda Bleacher, MD  Iron , Ferrous Sulfate , 325 (65 Fe) MG TABS Take 325 mg by mouth 2 (two) times daily. 07/02/21   Tanda Bleacher, MD  nystatin  cream (MYCOSTATIN ) Apply thin layer  to the area twice a day as needed Patient not taking: Reported on 10/08/2023 04/27/23   Delores Nidia CROME, FNP    Family History  Problem Relation Age of Onset   Arthritis Mother    Diabetes Mother    Asthma Mother    Other Father        unknown   Asthma Brother    Diabetes Maternal Grandmother      Social History   Tobacco Use   Smoking status: Never   Smokeless tobacco: Never  Vaping Use   Vaping status: Never Used  Substance Use Topics   Alcohol use: Never   Drug use: Never     Allergies as of 12/03/2023 - Review Complete 12/03/2023  Allergen Reaction Noted   Blueberry [vaccinium angustifolium]  10/09/2023    Review of Systems:    All systems reviewed and negative except where noted in HPI.   Physical Exam:  LMP 10/25/2023 (Exact Date)  Patient's last menstrual period was 10/25/2023 (exact date).  General:   Alert,  Well-developed, well-nourished, pleasant and cooperative in NAD Lungs:  Respirations even and unlabored.  Clear throughout to auscultation.   No wheezes, crackles, or rhonchi. No acute distress. Heart:  Regular rate and rhythm; no murmurs, clicks, rubs, or gallops. Abdomen:  Normal bowel sounds.  No bruits.  Soft, and non-distended without masses, hepatosplenomegaly or hernias noted.  No Tenderness.  No guarding or rebound tenderness.    Neurologic:  Alert and oriented x3;  grossly normal neurologically. Psych:  Alert and cooperative. Normal mood and affect.  Imaging Studies: No results found.  Labs: CBC    Component Value Date/Time   WBC 5.4 12/03/2023 1030   RBC 4.58 12/03/2023 1030   HGB 11.2 (L) 12/03/2023 1030   HGB 10.2 (L) 10/05/2023 1826   HCT 35.9 (L) 12/03/2023 1030   HCT 35.0 10/05/2023 1826   PLT 146.0 (L) 12/03/2023 1030   PLT 143 (L) 10/05/2023 1826   MCV 78.3 12/03/2023 1030   MCV 82 10/05/2023 1826    CMP     Component Value Date/Time   NA 135 12/03/2023 1030   NA 141 10/05/2023 1826   K 4.1 12/03/2023 1030   CL 102 12/03/2023 1030   CO2 24 12/03/2023 1030   GLUCOSE 85 12/03/2023 1030   BUN 9 12/03/2023 1030   BUN 13 10/05/2023 1826   CREATININE 0.60 12/03/2023 1030   CREATININE 0.58 10/24/2019 1210   CALCIUM  8.9 12/03/2023 1030   PROT 7.5 10/09/2023 1212   PROT 7.2 12/23/2022 1621   ALBUMIN 4.1 10/09/2023 1212   ALBUMIN 4.3 12/23/2022 1621   AST 24 10/09/2023 1212   AST 17 10/24/2019 1210   ALT 14 10/09/2023 1212   ALT 16 10/24/2019 1210   ALKPHOS 57 10/09/2023 1212   BILITOT 0.5 10/09/2023 1212    BILITOT 0.2 12/23/2022 1621   BILITOT 0.3 10/24/2019 1210   GFRNONAA >60 10/09/2023 1212   GFRNONAA >60 10/24/2019 1210   GFRAA >60 11/09/2019 1652   GFRAA >60 10/24/2019 1210    Assessment and Plan:   Michelle Velez is a 33 y.o. y/o female has been referred for:  1.  Rectal bleeding: Mild and intermittent.  No current bleeding. - Fecal calprotectin - She is currently pregnant, early pregnancy.  Positive at home pregnancy test last week.  We cannot schedule colonoscopy until pregnancy has finished.  2.  Anemia; Hx IDA; Chronic anemia since age 46 - Labs: CBC, iron  panel, ferritin, B12, folate,  and celiac lab.  - Continue oral iron  supplement as needed.  3.  Mild Constipation - Start Miralax mix 1 capful in a drink once daily.  4.  Positive Pregnancy (Last week) - NO procedures can be scheduled until pregnancy is completed.  Follow up after pregnancy has completed, then we can schedule colonoscopy procedure.  Also follow-up based on above lab results and GI symptoms.  Ellouise Console, PA-C

## 2023-12-03 ENCOUNTER — Other Ambulatory Visit (INDEPENDENT_AMBULATORY_CARE_PROVIDER_SITE_OTHER)

## 2023-12-03 ENCOUNTER — Ambulatory Visit: Admitting: Physician Assistant

## 2023-12-03 ENCOUNTER — Other Ambulatory Visit: Payer: Self-pay

## 2023-12-03 ENCOUNTER — Encounter: Payer: Self-pay | Admitting: Physician Assistant

## 2023-12-03 DIAGNOSIS — K625 Hemorrhage of anus and rectum: Secondary | ICD-10-CM

## 2023-12-03 DIAGNOSIS — E876 Hypokalemia: Secondary | ICD-10-CM | POA: Diagnosis not present

## 2023-12-03 DIAGNOSIS — D649 Anemia, unspecified: Secondary | ICD-10-CM

## 2023-12-03 DIAGNOSIS — K5900 Constipation, unspecified: Secondary | ICD-10-CM | POA: Diagnosis not present

## 2023-12-03 DIAGNOSIS — D509 Iron deficiency anemia, unspecified: Secondary | ICD-10-CM | POA: Diagnosis not present

## 2023-12-03 LAB — CBC WITH DIFFERENTIAL/PLATELET
Basophils Absolute: 0 K/uL (ref 0.0–0.1)
Basophils Relative: 0.7 % (ref 0.0–3.0)
Eosinophils Absolute: 0.1 K/uL (ref 0.0–0.7)
Eosinophils Relative: 2.1 % (ref 0.0–5.0)
HCT: 35.9 % — ABNORMAL LOW (ref 36.0–46.0)
Hemoglobin: 11.2 g/dL — ABNORMAL LOW (ref 12.0–15.0)
Lymphocytes Relative: 23.4 % (ref 12.0–46.0)
Lymphs Abs: 1.3 K/uL (ref 0.7–4.0)
MCHC: 31.2 g/dL (ref 30.0–36.0)
MCV: 78.3 fl (ref 78.0–100.0)
Monocytes Absolute: 0.7 K/uL (ref 0.1–1.0)
Monocytes Relative: 13.6 % — ABNORMAL HIGH (ref 3.0–12.0)
Neutro Abs: 3.3 K/uL (ref 1.4–7.7)
Neutrophils Relative %: 60.2 % (ref 43.0–77.0)
Platelets: 146 K/uL — ABNORMAL LOW (ref 150.0–400.0)
RBC: 4.58 Mil/uL (ref 3.87–5.11)
RDW: 17.7 % — ABNORMAL HIGH (ref 11.5–15.5)
WBC: 5.4 K/uL (ref 4.0–10.5)

## 2023-12-03 LAB — IRON,TIBC AND FERRITIN PANEL
%SAT: 7 % — ABNORMAL LOW (ref 16–45)
Ferritin: 6 ng/mL — ABNORMAL LOW (ref 16–154)
Iron: 31 ug/dL — ABNORMAL LOW (ref 40–190)
TIBC: 466 ug/dL — ABNORMAL HIGH (ref 250–450)

## 2023-12-03 LAB — BASIC METABOLIC PANEL WITH GFR
BUN: 9 mg/dL (ref 6–23)
CO2: 24 meq/L (ref 19–32)
Calcium: 8.9 mg/dL (ref 8.4–10.5)
Chloride: 102 meq/L (ref 96–112)
Creatinine, Ser: 0.6 mg/dL (ref 0.40–1.20)
GFR: 117.05 mL/min (ref >60.00)
Glucose, Bld: 85 mg/dL (ref 70–99)
Potassium: 4.1 meq/L (ref 3.5–5.1)
Sodium: 135 meq/L (ref 135–145)

## 2023-12-03 LAB — VITAMIN B12: Vitamin B-12: 808 pg/mL (ref 211–911)

## 2023-12-03 LAB — FOLATE: Folate: 21 ng/mL (ref >5.9)

## 2023-12-03 NOTE — Patient Instructions (Addendum)
 Your provider has requested that you go to the basement level for lab work before leaving today. Press B on the elevator. The lab is located at the first door on the left as you exit the elevator.     For constipation: Start OTC Miralax Powder Mix 1 capful in 6 to 8 ounces of a drink once daily  Recommend high-fiber diet, 30 g of fiber daily Eat fruits, vegetables, and whole grains Drink 64 ounces of water  / fluids daily.   Please follow up sooner if symptoms increase or worsen  Due to recent changes in healthcare laws, you may see the results of your imaging and laboratory studies on MyChart before your provider has had a chance to review them.  We understand that in some cases there may be results that are confusing or concerning to you. Not all laboratory results come back in the same time frame and the provider may be waiting for multiple results in order to interpret others.  Please give us  48 hours in order for your provider to thoroughly review all the results before contacting the office for clarification of your results.   Thank you for trusting me with your gastrointestinal care!   Ellouise Console, PA-C _______________________________________________________  If your blood pressure at your visit was 140/90 or greater, please contact your primary care physician to follow up on this.  _______________________________________________________  If you are age 39 or older, your body mass index should be between 23-30. Your There is no height or weight on file to calculate BMI. If this is out of the aforementioned range listed, please consider follow up with your Primary Care Provider.  If you are age 58 or younger, your body mass index should be between 19-25. Your There is no height or weight on file to calculate BMI. If this is out of the aformentioned range listed, please consider follow up with your Primary Care Provider.    ________________________________________________________  The [facility] providers would like to encourage you to use MYCHART to communicate with providers for non-urgent requests or questions.  Due to long hold times on the telephone, sending your provider a message by [facility] may be a faster and more efficient way to get a response.  Please allow 48 business hours for a response.  Please remember that this is for non-urgent requests.  _______________________________________________________

## 2023-12-06 ENCOUNTER — Ambulatory Visit: Payer: Self-pay | Admitting: Physician Assistant

## 2023-12-06 LAB — CELIAC DISEASE AB SCREEN W/RFX
Antigliadin Abs, IgA: 2 U (ref 0–19)
IgA/Immunoglobulin A, Serum: 134 mg/dL (ref 87–352)
Transglutaminase IgA: <2 U/mL (ref 0–3)

## 2024-04-20 ENCOUNTER — Telehealth: Payer: Self-pay

## 2024-04-20 NOTE — Telephone Encounter (Signed)
 Copied from CRM [id number]. Topic: Referral - Request for Referral >> Apr 20, 2024 10:12 AM Burnard DEL wrote: Did the patient discuss referral with their provider in the last year? Yes (If No - schedule appointment) (If Yes - send message)  Appointment offered? Yes  Type of order/referral and detailed reason for visit: Neurologist- for numbness and tingling in hands and feet . Patient stated that she seen provider for issue  in June 2025.  Preference of office, provider, location:  neurology   If referral order, have you been seen by this specialty before? No (If Yes, this issue or another issue? When? Where?  Can we respond through MyChart? Yes

## 2024-04-27 IMAGING — MG DIGITAL DIAGNOSTIC BILAT W/ TOMO W/ CAD
8 of 15 series · 8 of 40 positions shown · non-contrast
Comparison: Prior left breast ultrasound dated 07/15/2018 and
ultrasound-guided core biopsy dated 08/29/2018.

CLINICAL DATA: 31-year-old female with history of a left breast
ultrasound-guided core biopsy on 08/29/2018 at [facility]
of a mass at 5 o'clock in the subareolar location demonstrating
benign fibroepithelial nodule exhibiting duct epithelial
hyperplasia, columnar cell hyperplasia, apocrine metaplasia and
adenosis. Patient was supposed to have short-term interval six-month
follow-up ultrasound. Differential included fibroadenoma variant and
hamartoma. Patient states the biopsied mass is palpable and stable
since 1051. Delayed follow-up.

EXAM:
DIGITAL DIAGNOSTIC BILATERAL MAMMOGRAM WITH TOMOSYNTHESIS AND CAD;
ULTRASOUND LEFT BREAST LIMITED
TECHNIQUE: Bilateral digital diagnostic mammography and breast tomosynthesis
was performed. The images were evaluated with computer-aided
detection.; Targeted ultrasound examination of the left breast was
performed.

[L TAN synth-2D]
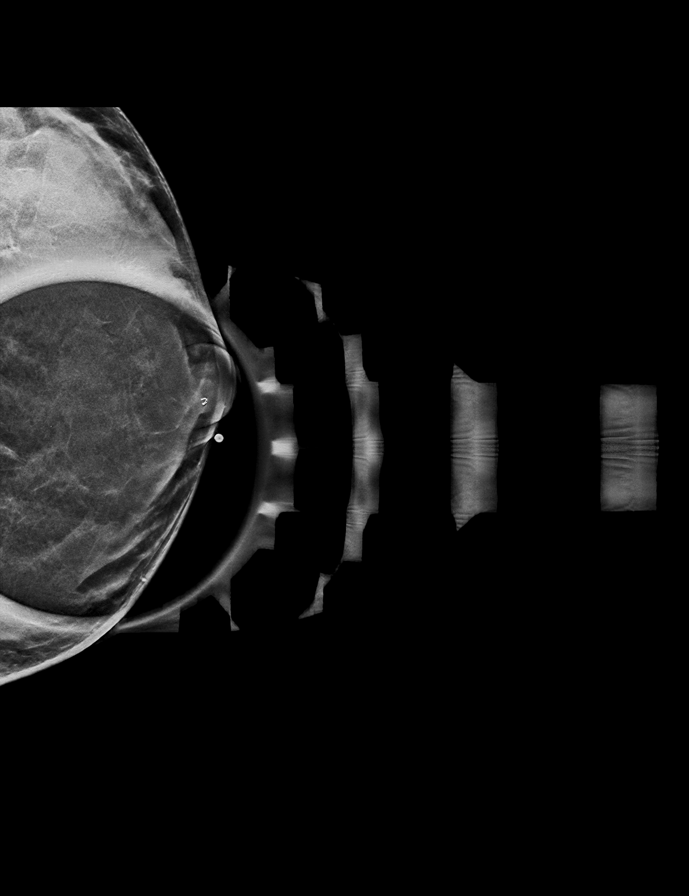

[R CC synth-2D]
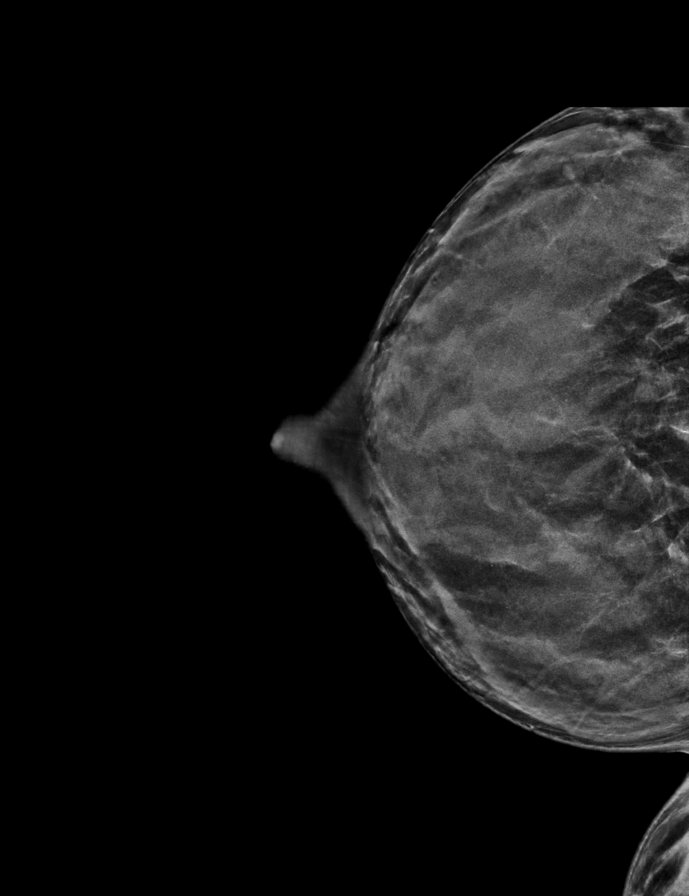

[L CC synth-2D]
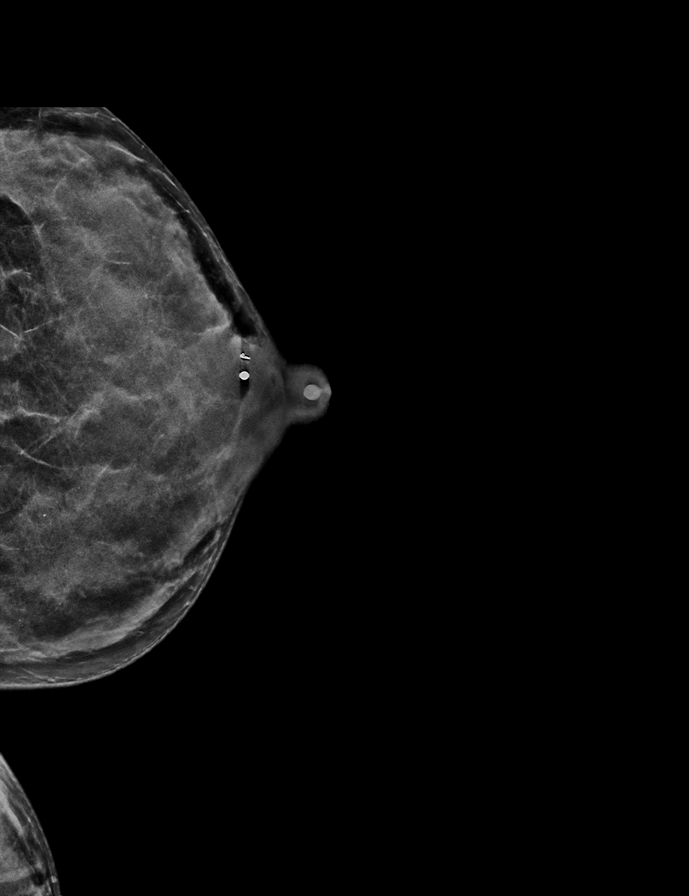

[L MLO synth-2D (1 of 2)]
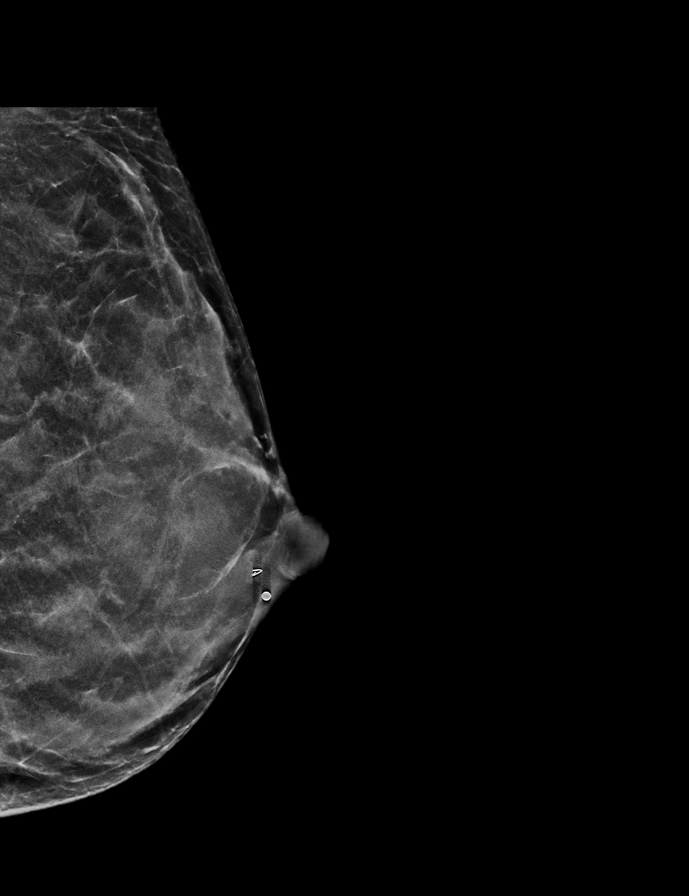

[R MLO synth-2D]
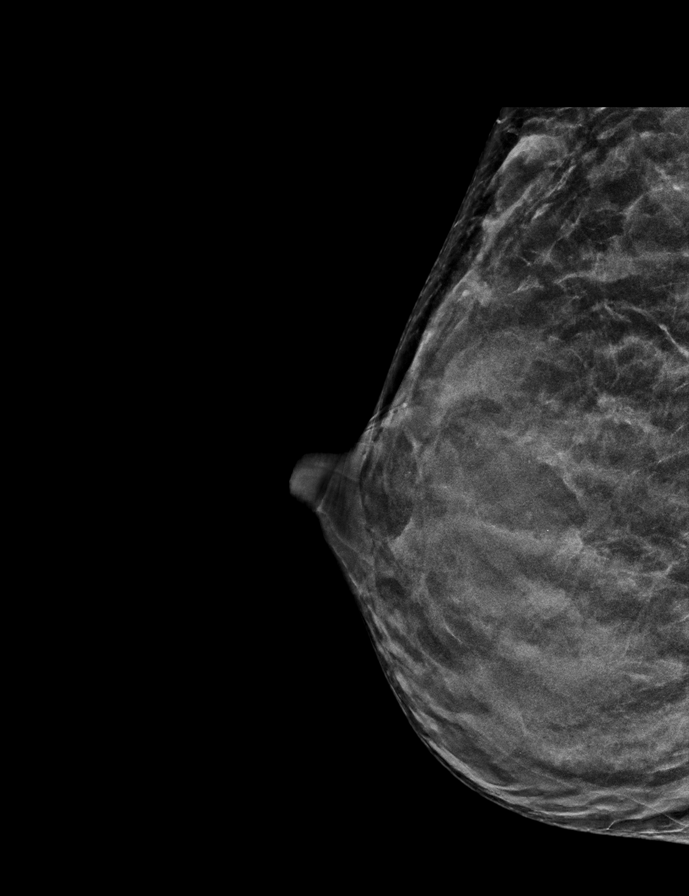

[L MLO synth-2D (2 of 2)]
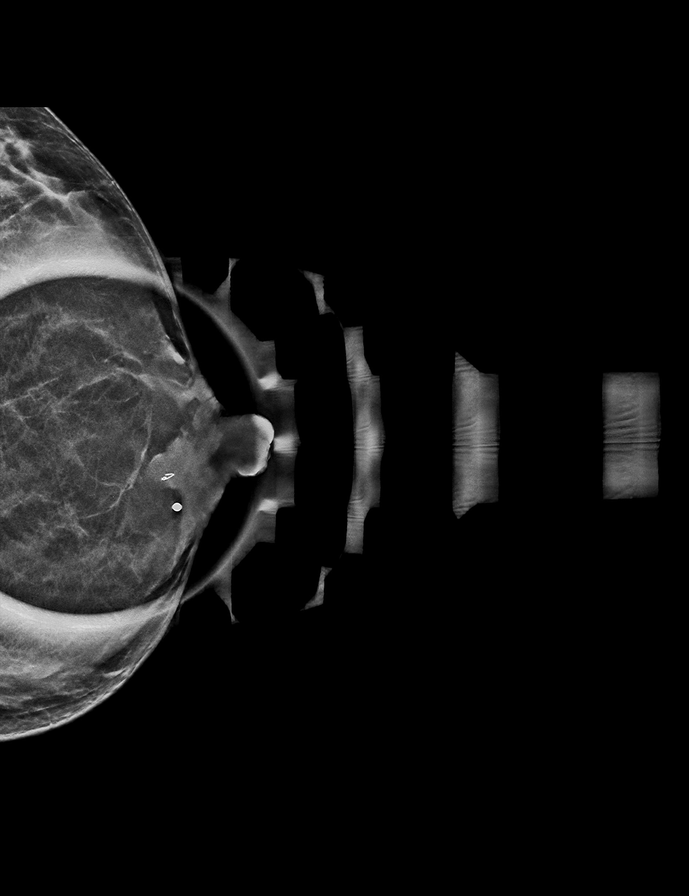

[L XCCL synth-2D]
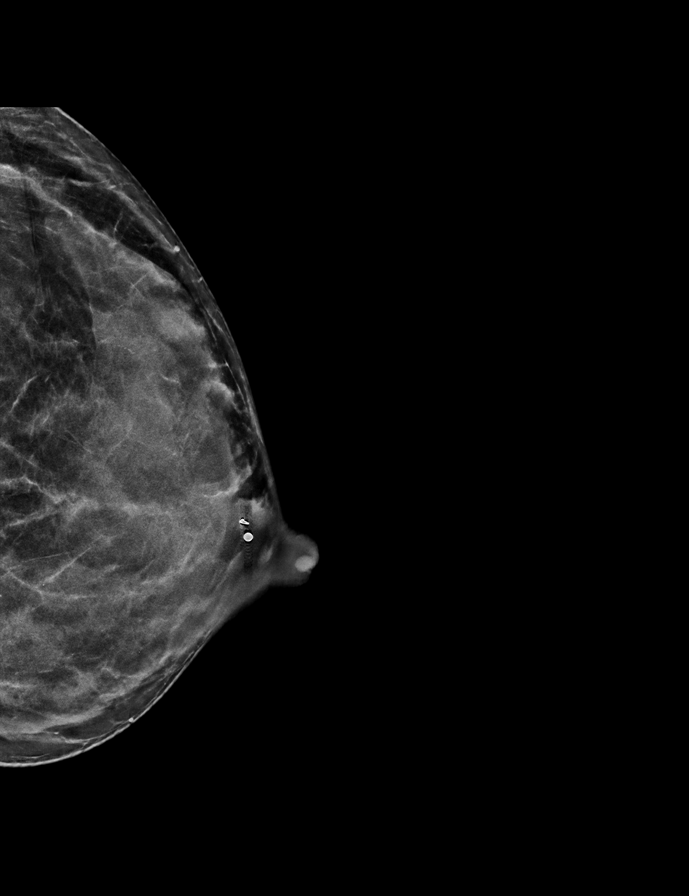

[L MLO tomo · tomo slice 30/44.0]
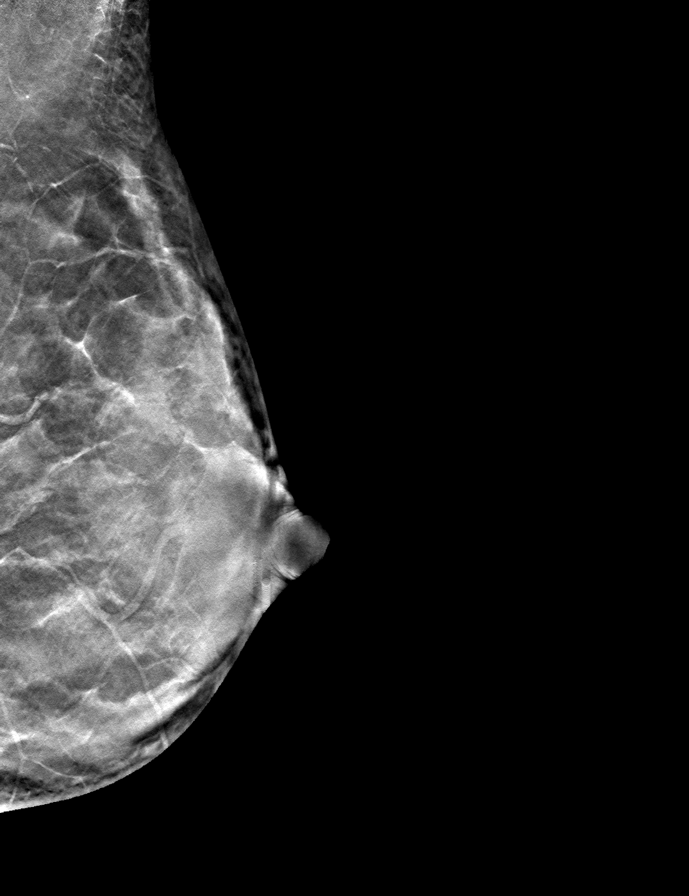

[8 of 40 positions shown; findings below may reference images not displayed]

ACR Breast Density Category d: The breast tissue is extremely dense,
which lowers the sensitivity of mammography.
FINDINGS: No suspicious mass or malignant type microcalcifications identified
in either breast. There is a biopsy clip in the lower outer
retroareolar region of the left breast.

On physical exam, I palpate a discrete mass in the left breast at 5
o'clock in the retroareolar region.

Targeted ultrasound is performed, showing a lobulated hyperechoic
mass in the 5 o'clock retroareolar region of the left breast
measuring 1.0 x 0.7 x 1.3 cm. On the prior ultrasound dated
07/15/2018 it measured 1.0 x 0.5 x 1.3 cm.
IMPRESSION: Stable mass in the 5 o'clock region of the left breast that has
previously been biopsied. Patient states that it has been clinically
stable since 1051.

RECOMMENDATION:
If the clinical exam remains benign/stable screening mammography can
be deferred until the age of 40. The importance of self-breast
examination was discussed with the patient. If the patient feels an
enlarging palpable abnormality she should return for further imaging
evaluation.

I have discussed the findings and recommendations with the patient.
If applicable, a reminder letter will be sent to the patient
regarding the next appointment.

BI-RADS CATEGORY  2: Benign.

## 2024-05-15 ENCOUNTER — Encounter: Admitting: Family Medicine

## 2024-05-23 ENCOUNTER — Encounter: Admitting: Family Medicine
